# Patient Record
Sex: Male | Born: 1955
Health system: Southern US, Community
[De-identification: ages and names within clinical notes are randomized; demographics above are authoritative.]

## PROBLEM LIST (undated history)

## (undated) DIAGNOSIS — R7303 Prediabetes: Secondary | ICD-10-CM

## (undated) DIAGNOSIS — C61 Malignant neoplasm of prostate: Secondary | ICD-10-CM

## (undated) DIAGNOSIS — T7840XA Allergy, unspecified, initial encounter: Secondary | ICD-10-CM

## (undated) DIAGNOSIS — I872 Venous insufficiency (chronic) (peripheral): Secondary | ICD-10-CM

## (undated) DIAGNOSIS — I10 Essential (primary) hypertension: Secondary | ICD-10-CM

## (undated) DIAGNOSIS — E785 Hyperlipidemia, unspecified: Secondary | ICD-10-CM

## (undated) DIAGNOSIS — E119 Type 2 diabetes mellitus without complications: Secondary | ICD-10-CM

## (undated) HISTORY — DX: Essential (primary) hypertension: I10

## (undated) HISTORY — DX: Allergy, unspecified, initial encounter: T78.40XA

## (undated) HISTORY — PX: CIRCUMCISION: SUR203

## (undated) HISTORY — PX: COLONOSCOPY: SHX174

---

## 2006-02-16 ENCOUNTER — Ambulatory Visit: Payer: Self-pay | Admitting: Gastroenterology

## 2006-03-24 ENCOUNTER — Encounter (INDEPENDENT_AMBULATORY_CARE_PROVIDER_SITE_OTHER): Payer: Self-pay | Admitting: Specialist

## 2006-03-24 ENCOUNTER — Ambulatory Visit: Payer: Self-pay | Admitting: Gastroenterology

## 2010-06-13 ENCOUNTER — Encounter: Admission: RE | Admit: 2010-06-13 | Discharge: 2010-06-13 | Payer: Self-pay | Admitting: Family Medicine

## 2010-09-09 ENCOUNTER — Ambulatory Visit: Payer: Self-pay | Admitting: Gastroenterology

## 2011-01-21 NOTE — Procedures (Signed)
Summary: Colon   Colonoscopy  Procedure date:  03/24/2006  Findings:      Location:  [facility].    Colonoscopy  Procedure date:  03/24/2006  Findings:      Location:  [facility].   Patient Name: Andrew [name], Andrew [name] MRN:  Procedure Procedures: Colonoscopy CPT: [phone].    with polypectomy. CPT: [id number].  Personnel: Endoscopist: Rachael Fee, MD.  Exam Location: Exam performed in Outpatient Clinic. Outpatient  Patient Consent: Procedure, Alternatives, Risks and Benefits discussed, consent obtained, from patient. Consent was obtained by the RN.  Indications Symptoms: Hematochezia.  History  Current Medications: Patient is not currently taking Coumadin.  Pre-Exam Physical: Performed Mar 24, 2006. Cardio-pulmonary exam, Abdominal exam, Mental status exam WNL.  Comments: Pt. history reviewed/updated, physical exam performed prior to initiation of sedation? yes Exam Exam: Extent of exam reached: Cecum, extent intended: Cecum.  The cecum was identified by appendiceal orifice and IC valve. Patient position: on left side. Colon retroflexion performed. Images taken. ASA Classification: II. Tolerance: good.  Monitoring: Pulse and BP monitoring, Oximetry used. Supplemental O2 given.  Colon Prep Prep results: good.  Sedation Meds: Patient assessed and found to be appropriate for moderate (conscious) sedation. Fentanyl 100 mcg. given IV. Versed 9 mg. given IV.  Findings POLYP: Sigmoid Colon, Maximum size: 3 mm. sessile polyp. Procedure:  snare without cautery, removed, retrieved, Polyp sent to pathology. Polyp sent to pathology. ICD9: Colon Polyps: 211.3.  POLYP: Sigmoid Colon, Maximum size: 4 mm. sessile polyp. Procedure:  snare without cautery, removed, retrieved, sent to pathology. sent to pathology. ICD9: Colon Polyps: 211.3.  - NORMAL EXAM: Cecum to Rectum. Comments: Otherwise normal examination.  - HEMORRHOIDS: External. Size: Small.  ICD9: Hemorrhoids, External: 455.3. Comments: This is much smaller than when he was seen in clinic one month ago.   Assessment Abnormal examination, see findings above.  Diagnoses: 211.3: Colon Polyps.  455.3: Hemorrhoids, External.   Comments: 2 small colon polyps, no cancers. Comments: The large external hemorrhoid has decreased in size with topical treatments and fiber supplemenation, but was the likely source of his previous rectal bleeding.  Events  Unplanned Interventions: No intervention was required.  Unplanned Events: There were no complications. Plans Comments: He will be referred to general surgery to discuss hemorrhoidectomy options, timing.  If the polyps are tubular adenomas he will need repeat colonoscopy in 5 years. Otherwise he should continue to follow colorectal cancer screening guidelines for "routine risk" patients with colonoscopy in 10 years. Disposition: After procedure patient sent to recovery. After recovery patient sent home.  Scheduling/Referral: Await pathology to schedule patient.  Comments: General surgery referral.  Please send my recent office note and this report. This report was created from the original endoscopy report, which was reviewed and signed by the above listed endoscopist.

## 2011-01-21 NOTE — Assessment & Plan Note (Signed)
   History of Present Illness Visit Type: Initial Consult Primary GI MD: Rob Bunting MD Primary Artyom Stencel: Lorelee New Requesting Artavius Stearns: Lorelee New Chief Complaint: Screening for Colonoscopy, No GI Complaints, Mother was dx'd with rectal cancer History of Present Illness:     very pleasant 55 year old man who's mother was just diagnosed with rectal cancer, she is older and probably not going to undergo any treatment.   she is in her 61s.  he underwent April 2007 colonoscopy. 2 small hyperplastic polyps were removed, he was recommended to have a repeat colonoscopy at 10 year interval.   Since then no bowel changes at all. He has lost some weight, intensionally.  His PCP sent him  back to consider sooner colonoscopy.           Current Medications (verified): 1)  Aspirin 81 Mg Tbec (Aspirin) .Marland Kitchen.. 1 By Mouth Once Daily 2)  Centrum Silver  Tabs (Multiple Vitamins-Minerals) .Marland Kitchen.. 1 By Mouth Once Daily 3)  Maxzide-25 37.5-25 Mg Tabs (Triamterene-Hctz) .Marland Kitchen.. 1 By Mouth Once Daily  Allergies (verified): No Known Drug Allergies  Past History:  Past Medical History: Hypertension Hemorrhoids  hyperplastic colon polyps, 2007 Elevated risk for colon cancer (mother had  colon cancer)  Past Surgical History: none  Family History: mother diagnosed with rectal cancer in her 44s  Social History: he is married, he does not have children, he works as a Occupational psychologist, he does not smoke cigarettes or drink alcohol  Review of Systems       Pertinent positive and negative review of systems were noted in the above HPI and GI specific review of systems.  All other review of systems was otherwise negative.   Vital Signs:  Patient profile:   55 year old male Height:      72 inches Weight:      250 pounds BMI:     34.03 BSA:     2.34 Pulse rate:   78 / minute Pulse rhythm:   regular BP sitting:   138 / 84  (left arm)  Vitals Entered By: Merri Ray CMA Duncan Dull)  (September 09, 2010 10:35 AM)  Physical Exam  Additional Exam:  Constitutional: generally well appearing Psychiatric: alert and oriented times 3 Eyes: extraocular movements intact Mouth: oropharynx moist, no lesions Neck: supple, no lymphadenopathy Cardiovascular: heart regular rate and rythm Lungs: CTA bilaterally Abdomen: soft, non-tender, non-distended, no obvious ascites, no peritoneal signs, normal bowel sounds Extremities: no lower extremity edema bilaterally Skin: no lesions on visible extremities    Impression & Recommendations:  Problem # 1:  Elevated risk for colon cancer, family history we will change his recall interval to 5 years instead of 10 years. That gives him another 6 months until he is due for colonoscopy in April, 2012.  Patient Instructions: 1)  Recall colonoscopy (change from previous rec's) for colonoscopy in April 2012. 2)  Call Dr. Christella Hartigan' office sooner if any new problems arise. 3)  A copy of this information will be sent to Dr. Bruna Potter. 4)  The medication list was reviewed and reconciled.  All changed / newly prescribed medications were explained.  A complete medication list was provided to the patient / caregiver.  Appended Document: recall colon    Clinical Lists Changes  Observations: Added new observation of COLONNXTDUE: 03/2011 (09/09/2010 11:08)

## 2011-06-12 ENCOUNTER — Encounter (INDEPENDENT_AMBULATORY_CARE_PROVIDER_SITE_OTHER): Payer: Self-pay | Admitting: General Surgery

## 2011-06-12 ENCOUNTER — Other Ambulatory Visit (INDEPENDENT_AMBULATORY_CARE_PROVIDER_SITE_OTHER): Payer: Self-pay | Admitting: General Surgery

## 2016-01-03 ENCOUNTER — Encounter (HOSPITAL_COMMUNITY): Payer: Self-pay

## 2016-01-03 ENCOUNTER — Emergency Department (INDEPENDENT_AMBULATORY_CARE_PROVIDER_SITE_OTHER)
Admission: EM | Admit: 2016-01-03 | Discharge: 2016-01-03 | Disposition: A | Discharge status: Home / Self Care | Payer: 59 | Source: Home / Self Care | Attending: Family Medicine | Admitting: Family Medicine

## 2016-01-03 DIAGNOSIS — Z760 Encounter for issue of repeat prescription: Secondary | ICD-10-CM

## 2016-01-03 HISTORY — DX: Hyperlipidemia, unspecified: E78.5

## 2016-01-03 MED ORDER — AMLODIPINE BESYLATE 5 MG PO TABS: 5.0000 mg | ORAL_TABLET | Freq: Every day | ORAL | Status: DC

## 2016-01-03 MED ORDER — FUROSEMIDE 20 MG PO TABS: 20.0000 mg | ORAL_TABLET | Freq: Every day | ORAL | Status: AC

## 2016-01-03 MED ORDER — PRAVASTATIN SODIUM 10 MG PO TABS: 10.0000 mg | ORAL_TABLET | Freq: Every day | ORAL | Status: AC

## 2016-01-03 NOTE — ED Provider Notes (Signed)
CSN: [id number]     Arrival date & time 01/03/16  1548 History   First MD Initiated Contact with Patient 01/03/16 1723     Chief Complaint  Patient presents with  . 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 Pt had letter stating if medications need to be refilled, come to [facility]. Past Medical History  Diagnosis Date  . Hypertension   . Hyperlipemia    Past Surgical History  Procedure Laterality Date  . Colonoscopy    . Circumcision     No family history on file. Social History  Substance Use Topics  . Smoking status: Never Smoker   . Smokeless tobacco: Not on file  . Alcohol Use: Yes    Review of Systems ROS +'ve medication refill  Denies: HEADACHE, NAUSEA, ABDOMINAL PAIN, CHEST PAIN, CONGESTION, DYSURIA, SHORTNESS OF BREATH  Allergies  Review of patient's allergies indicates no known allergies.  Home Medications   Prior to Admission medications   Medication Sig Start Date End Date Taking? Authorizing Provider  amLODipine (NORVASC) 5 MG tablet Take 1 tablet (5 mg total) by mouth daily. 01/03/16   Konrad Felix, PA  aspirin 81 MG tablet Take 81 mg by mouth daily.      Historical Provider, MD  furosemide (LASIX) 20 MG tablet Take 1 tablet (20 mg total) by mouth daily. 01/03/16   Konrad Felix, PA  pravastatin (PRAVACHOL) 10 MG tablet Take 1 tablet (10 mg total) by mouth daily. 01/03/16   Konrad Felix, PA  triamterene-hydrochlorothiazide (MAXZIDE-25) 37.5-25 MG per tablet Take 1 tablet by mouth daily.      Historical Provider, MD   Meds Ordered and Administered this Visit  Medications - No data to display  BP 169/77 mmHg  Pulse 73  Temp(Src) 98.2 F (36.8 C) (Oral)  Resp 16  SpO2 99% No data found.   Physical Exam NURSES NOTES AND VITAL SIGNS REVIEWED. CONSTITUTIONAL: Well developed, well nourished, no acute distress HEENT:  normocephalic, atraumatic EYES: Conjunctiva normal NECK:normal ROM, supple PULMONARY:No respiratory distress, normal effort, Lungs: CTAb/l CARDIOVASCULAR: RRR, no murmur ABDOMEN: soft, ND, NT, +'ve BS MUSCULOSKELETAL: Normal ROM of all extremities SKIN: warm and dry without rash PSYCHIATRIC: Mood and affect normal  ED Course  Procedures (including critical care time)  Labs Review Labs Reviewed - No data to display  Imaging Review No results found.   Visual Acuity Review  Right Eye Distance:   Left Eye Distance:   Bilateral Distance:    Right Eye Near:   Left Eye Near:    Bilateral Near:         MDM   1. Encounter for medication refill    Patient is advised to continue home symptomatic treatment. Prescription for amlodipine,furosemide, pravastatin sent pharmacy patient has indicated. Patient is advised that if there are new or worsening symptoms or attend the emergency department, or contact primary care provider. Instructions of care provided discharged home in stable condition.  THIS NOTE WAS GENERATED USING A VOICE RECOGNITION SOFTWARE PROGRAM. ALL REASONABLE EFFORTS  WERE MADE TO PROOFREAD THIS DOCUMENT FOR ACCURACY.     Konrad Felix, [place] 01/03/16 1925

## 2016-01-03 NOTE — Discharge Instructions (Signed)
Medicine Refill at the Emergency Department  We have refilled your medicine today, but it is best for you to get refills through your primary health care provider's office. In the future, please plan ahead so you do not need to get refills from the emergency department.  If the medicine we refilled was a maintenance medicine, you may have received only enough to get you by until you are able to see your regular health care provider.     This information is not intended to replace advice given to you by your health care provider. Make sure you discuss any questions you have with your health care provider.     Document Released: 03/26/2004 Document Revised: 12/29/2014 Document Reviewed: 03/17/2014  Elsevier Interactive Patient Education 2016 [employer]

## 2016-01-03 NOTE — ED Notes (Signed)
Pt stated that his PCP passed on 04-01-2023 and that he needs refills for amlodipine,furosemide and pravastatin  Pt alert and oriented

## 2017-02-16 DIAGNOSIS — M791 Myalgia: Secondary | ICD-10-CM | POA: Diagnosis not present

## 2017-02-16 DIAGNOSIS — Z79899 Other long term (current) drug therapy: Secondary | ICD-10-CM | POA: Diagnosis not present

## 2017-02-16 DIAGNOSIS — M2550 Pain in unspecified joint: Secondary | ICD-10-CM | POA: Diagnosis not present

## 2017-02-16 DIAGNOSIS — R7302 Impaired glucose tolerance (oral): Secondary | ICD-10-CM | POA: Diagnosis not present

## 2017-02-16 DIAGNOSIS — I119 Hypertensive heart disease without heart failure: Secondary | ICD-10-CM | POA: Diagnosis not present

## 2017-02-16 DIAGNOSIS — E784 Other hyperlipidemia: Secondary | ICD-10-CM | POA: Diagnosis not present

## 2017-02-16 DIAGNOSIS — M609 Myositis, unspecified: Secondary | ICD-10-CM | POA: Diagnosis not present

## 2017-06-15 DIAGNOSIS — M2550 Pain in unspecified joint: Secondary | ICD-10-CM | POA: Diagnosis not present

## 2017-06-15 DIAGNOSIS — E784 Other hyperlipidemia: Secondary | ICD-10-CM | POA: Diagnosis not present

## 2017-06-15 DIAGNOSIS — R7302 Impaired glucose tolerance (oral): Secondary | ICD-10-CM | POA: Diagnosis not present

## 2017-06-15 DIAGNOSIS — E7800 Pure hypercholesterolemia, unspecified: Secondary | ICD-10-CM | POA: Diagnosis not present

## 2017-06-15 DIAGNOSIS — I119 Hypertensive heart disease without heart failure: Secondary | ICD-10-CM | POA: Diagnosis not present

## 2017-06-15 DIAGNOSIS — M609 Myositis, unspecified: Secondary | ICD-10-CM | POA: Diagnosis not present

## 2017-06-15 DIAGNOSIS — Z125 Encounter for screening for malignant neoplasm of prostate: Secondary | ICD-10-CM | POA: Diagnosis not present

## 2017-09-21 DIAGNOSIS — I119 Hypertensive heart disease without heart failure: Secondary | ICD-10-CM | POA: Diagnosis not present

## 2017-09-21 DIAGNOSIS — M2550 Pain in unspecified joint: Secondary | ICD-10-CM | POA: Diagnosis not present

## 2017-09-21 DIAGNOSIS — M609 Myositis, unspecified: Secondary | ICD-10-CM | POA: Diagnosis not present

## 2017-09-23 ENCOUNTER — Encounter: Payer: Self-pay | Admitting: Podiatry

## 2017-09-23 ENCOUNTER — Ambulatory Visit (INDEPENDENT_AMBULATORY_CARE_PROVIDER_SITE_OTHER): Payer: 59

## 2017-09-23 ENCOUNTER — Ambulatory Visit (INDEPENDENT_AMBULATORY_CARE_PROVIDER_SITE_OTHER): Payer: 59 | Admitting: Podiatry

## 2017-09-23 VITALS — BP 165/94 | HR 71 | Resp 16

## 2017-09-23 DIAGNOSIS — M722 Plantar fascial fibromatosis: Secondary | ICD-10-CM

## 2017-09-23 MED ORDER — DICLOFENAC SODIUM 75 MG PO TBEC: 75.0000 mg | DELAYED_RELEASE_TABLET | Freq: Two times a day (BID) | ORAL | 2 refills | Status: DC

## 2017-09-23 MED ORDER — TRIAMCINOLONE ACETONIDE 10 MG/ML IJ SUSP
10.0000 mg | Freq: Once | INTRAMUSCULAR | Status: AC
  Administered 2017-09-23: 10 mg

## 2017-09-23 NOTE — Progress Notes (Signed)
Subjective:    Patient ID: Andrew [name], male   DOB: 61 y.o.   MRN: [id number]   HPIpatient presents stating he has a lot of pain in the bottom of the right heel and states he works 2 jobs on [employer] and is on his foot for 16 Hour days. Patient does not smoke and does like to be active    Review of Systems  All other systems reviewed and are negative.       Objective:  Physical Exam  Constitutional: He appears well-developed and well-nourished.  Cardiovascular: Intact distal pulses.   Musculoskeletal: Normal range of motion.  Neurological: He is alert.  Skin: Skin is warm.  Nursing note and vitals reviewed.  neurovascular status found to be intact muscle strength adequate range of motion within normal limits with exquisite discomfort plantar aspect of the heel region right at the insertional point tendon calcaneus and slightly more proximal with no indication of Achilles tendon injury at this time. Patient's found have good digital perfusion well oriented 3     Assessment:    Acute plantar fasciitis right with inflammation with moderate flatfoot deformity     Plan:   H&P x-rays reviewed and today I went ahead and injected the right plantar fascia 3 mg Kenalog 5 mg Xylocaine and applied fascial brace with instructions placed on diclofenac 75 mg twice a day and gave him instructions for physical therapy. Reappoint her recheck in the next  X-ray indicates small spur with no indication of stress fracture arthritis with moderate depression of the arch

## 2017-09-23 NOTE — Patient Instructions (Signed)

## 2017-09-30 ENCOUNTER — Encounter: Payer: Self-pay | Admitting: Podiatry

## 2017-09-30 ENCOUNTER — Ambulatory Visit (INDEPENDENT_AMBULATORY_CARE_PROVIDER_SITE_OTHER): Payer: 59 | Admitting: Podiatry

## 2017-09-30 DIAGNOSIS — M722 Plantar fascial fibromatosis: Secondary | ICD-10-CM | POA: Diagnosis not present

## 2017-09-30 NOTE — Progress Notes (Signed)
Subjective:    Patient ID: Andrew [name], male   DOB: 61 y.o.   MRN: [id number]   HPI patient states that the heels feeling quite a bit better but he is on his foot 16 hours a day and is flat-footed    ROS      Objective:  Physical Exam neurovascular status intact negative Homan sign was noted with patient found to have significant flattening the arch bilateral with inflammation around the mid arch area that's mild in intensity     Assessment:  Significant structural malalignment of the foot with moderate fasciitis     Plan:    Advised on physical therapy anti-inflammatories and continued lifting of the arch and also discussed long-term orthotics and he will be approved and have them made by Liliane Channel with probable lifting of the arch bilateral and a more rigid type orthotic with cushioned top cover

## 2017-10-07 ENCOUNTER — Telehealth: Payer: Self-pay | Admitting: Podiatry

## 2017-10-07 NOTE — Telephone Encounter (Signed)
lvm for pt to call back to discuss orthotic coverage. Insurance will only cover if diabetic.

## 2017-12-18 ENCOUNTER — Telehealth: Payer: Self-pay | Admitting: *Deleted

## 2017-12-18 MED ORDER — DICLOFENAC SODIUM 75 MG PO TBEC: 75.0000 mg | DELAYED_RELEASE_TABLET | Freq: Two times a day (BID) | ORAL | 0 refills | Status: DC

## 2017-12-18 NOTE — Telephone Encounter (Signed)
Refill request for diclofenac. Dr. Paulla Dolly ordered refill once, pt needs an appt.

## 2018-01-13 ENCOUNTER — Telehealth: Payer: Self-pay | Admitting: *Deleted

## 2018-01-13 MED ORDER — DICLOFENAC SODIUM 75 MG PO TBEC: 75.0000 mg | DELAYED_RELEASE_TABLET | Freq: Two times a day (BID) | ORAL | 0 refills | Status: DC

## 2018-01-13 NOTE — Telephone Encounter (Signed)
Refill request diclofenac. Dr. Paulla Dolly states refill once then pt needs and appt prior to future refills.

## 2018-01-18 DIAGNOSIS — M2550 Pain in unspecified joint: Secondary | ICD-10-CM | POA: Diagnosis not present

## 2018-01-18 DIAGNOSIS — I119 Hypertensive heart disease without heart failure: Secondary | ICD-10-CM | POA: Diagnosis not present

## 2018-01-18 DIAGNOSIS — M609 Myositis, unspecified: Secondary | ICD-10-CM | POA: Diagnosis not present

## 2018-04-16 ENCOUNTER — Telehealth: Payer: Self-pay | Admitting: *Deleted

## 2018-04-16 NOTE — Telephone Encounter (Signed)
Refill request for diclofenac. Dr. Paulla Dolly states pt needs to be seen, prior to future refills. Return fax denying.

## 2018-05-10 DIAGNOSIS — R2243 Localized swelling, mass and lump, lower limb, bilateral: Secondary | ICD-10-CM | POA: Diagnosis not present

## 2018-05-10 DIAGNOSIS — E7800 Pure hypercholesterolemia, unspecified: Secondary | ICD-10-CM | POA: Diagnosis not present

## 2018-05-10 DIAGNOSIS — I119 Hypertensive heart disease without heart failure: Secondary | ICD-10-CM | POA: Diagnosis not present

## 2018-05-10 DIAGNOSIS — R7302 Impaired glucose tolerance (oral): Secondary | ICD-10-CM | POA: Diagnosis not present

## 2018-05-10 DIAGNOSIS — E785 Hyperlipidemia, unspecified: Secondary | ICD-10-CM | POA: Diagnosis not present

## 2018-05-10 DIAGNOSIS — M609 Myositis, unspecified: Secondary | ICD-10-CM | POA: Diagnosis not present

## 2018-05-10 DIAGNOSIS — M2550 Pain in unspecified joint: Secondary | ICD-10-CM | POA: Diagnosis not present

## 2018-05-10 DIAGNOSIS — Z125 Encounter for screening for malignant neoplasm of prostate: Secondary | ICD-10-CM | POA: Diagnosis not present

## 2018-07-05 ENCOUNTER — Other Ambulatory Visit (HOSPITAL_COMMUNITY): Payer: Self-pay | Admitting: Pulmonary Disease

## 2018-07-05 DIAGNOSIS — M609 Myositis, unspecified: Secondary | ICD-10-CM | POA: Diagnosis not present

## 2018-07-05 DIAGNOSIS — L97911 Non-pressure chronic ulcer of unspecified part of right lower leg limited to breakdown of skin: Secondary | ICD-10-CM | POA: Diagnosis not present

## 2018-07-05 DIAGNOSIS — M7989 Other specified soft tissue disorders: Secondary | ICD-10-CM | POA: Diagnosis not present

## 2018-07-05 DIAGNOSIS — R609 Edema, unspecified: Secondary | ICD-10-CM

## 2018-07-06 ENCOUNTER — Ambulatory Visit (HOSPITAL_COMMUNITY)
Admission: RE | Admit: 2018-07-06 | Discharge: 2018-07-06 | Disposition: A | Discharge status: Home / Self Care | Payer: 59 | Source: Ambulatory Visit | Attending: Pulmonary Disease | Admitting: Pulmonary Disease

## 2018-07-06 DIAGNOSIS — R609 Edema, unspecified: Secondary | ICD-10-CM | POA: Insufficient documentation

## 2018-07-06 NOTE — Progress Notes (Addendum)
Bilateral lower extremity venous duplex has been completed. Negative for obvious evidence of DVT. Ultrasound characteristics of an enlarged lymph node measuring 0.6 cm high by 2.5 cm wide by 2 cm long is noted in the right groin.  Results were given to Dr. Katherine Roan.  07/06/18 11:21 AM Andrew [name] RVT

## 2019-01-17 ENCOUNTER — Other Ambulatory Visit (HOSPITAL_COMMUNITY): Payer: Self-pay | Admitting: Pulmonary Disease

## 2019-01-17 ENCOUNTER — Other Ambulatory Visit: Payer: Self-pay | Admitting: Pulmonary Disease

## 2019-01-17 DIAGNOSIS — R7302 Impaired glucose tolerance (oral): Secondary | ICD-10-CM | POA: Diagnosis not present

## 2019-01-17 DIAGNOSIS — I119 Hypertensive heart disease without heart failure: Secondary | ICD-10-CM | POA: Diagnosis not present

## 2019-01-17 DIAGNOSIS — I890 Lymphedema, not elsewhere classified: Secondary | ICD-10-CM | POA: Diagnosis not present

## 2019-01-17 DIAGNOSIS — R590 Localized enlarged lymph nodes: Secondary | ICD-10-CM

## 2019-01-17 DIAGNOSIS — E7800 Pure hypercholesterolemia, unspecified: Secondary | ICD-10-CM | POA: Diagnosis not present

## 2019-01-17 DIAGNOSIS — S80821A Blister (nonthermal), right lower leg, initial encounter: Secondary | ICD-10-CM | POA: Diagnosis not present

## 2019-01-17 DIAGNOSIS — M791 Myalgia, unspecified site: Secondary | ICD-10-CM | POA: Diagnosis not present

## 2019-01-17 DIAGNOSIS — Z125 Encounter for screening for malignant neoplasm of prostate: Secondary | ICD-10-CM | POA: Diagnosis not present

## 2019-01-21 ENCOUNTER — Ambulatory Visit (HOSPITAL_COMMUNITY): Payer: 59

## 2019-01-25 ENCOUNTER — Ambulatory Visit (HOSPITAL_COMMUNITY): Payer: 59

## 2019-02-02 ENCOUNTER — Encounter (HOSPITAL_COMMUNITY): Payer: Self-pay

## 2019-02-02 ENCOUNTER — Ambulatory Visit (HOSPITAL_COMMUNITY): Payer: 59

## 2019-02-03 ENCOUNTER — Other Ambulatory Visit: Payer: Self-pay | Admitting: Pulmonary Disease

## 2019-02-03 ENCOUNTER — Other Ambulatory Visit (HOSPITAL_COMMUNITY): Payer: Self-pay | Admitting: Pulmonary Disease

## 2019-03-17 ENCOUNTER — Other Ambulatory Visit: Payer: Self-pay | Admitting: Pulmonary Disease

## 2019-03-17 DIAGNOSIS — R591 Generalized enlarged lymph nodes: Secondary | ICD-10-CM

## 2019-04-18 ENCOUNTER — Encounter (HOSPITAL_COMMUNITY): Payer: Self-pay

## 2019-04-18 ENCOUNTER — Ambulatory Visit (HOSPITAL_COMMUNITY): Admission: RE | Admit: 2019-04-18 | Payer: 59 | Source: Ambulatory Visit

## 2019-05-02 DIAGNOSIS — M7989 Other specified soft tissue disorders: Secondary | ICD-10-CM | POA: Diagnosis not present

## 2019-05-02 DIAGNOSIS — E1165 Type 2 diabetes mellitus with hyperglycemia: Secondary | ICD-10-CM | POA: Diagnosis not present

## 2019-05-02 DIAGNOSIS — I119 Hypertensive heart disease without heart failure: Secondary | ICD-10-CM | POA: Diagnosis not present

## 2019-05-02 DIAGNOSIS — L819 Disorder of pigmentation, unspecified: Secondary | ICD-10-CM | POA: Diagnosis not present

## 2019-05-02 DIAGNOSIS — R590 Localized enlarged lymph nodes: Secondary | ICD-10-CM | POA: Diagnosis not present

## 2019-05-02 DIAGNOSIS — Z79899 Other long term (current) drug therapy: Secondary | ICD-10-CM | POA: Diagnosis not present

## 2019-05-09 ENCOUNTER — Ambulatory Visit (HOSPITAL_COMMUNITY): Payer: 59

## 2019-05-10 ENCOUNTER — Other Ambulatory Visit: Payer: Self-pay

## 2019-05-10 ENCOUNTER — Ambulatory Visit (HOSPITAL_COMMUNITY)
Admission: RE | Admit: 2019-05-10 | Discharge: 2019-05-10 | Disposition: A | Discharge status: Home / Self Care | Payer: 59 | Source: Ambulatory Visit | Attending: Pulmonary Disease | Admitting: Pulmonary Disease

## 2019-05-10 DIAGNOSIS — R591 Generalized enlarged lymph nodes: Secondary | ICD-10-CM | POA: Insufficient documentation

## 2019-05-19 ENCOUNTER — Other Ambulatory Visit (HOSPITAL_COMMUNITY): Payer: Self-pay | Admitting: Pulmonary Disease

## 2019-05-19 DIAGNOSIS — R591 Generalized enlarged lymph nodes: Secondary | ICD-10-CM

## 2019-05-19 DIAGNOSIS — M7989 Other specified soft tissue disorders: Secondary | ICD-10-CM

## 2019-06-23 HISTORY — PX: PROSTATE BIOPSY: SHX241

## 2019-07-01 ENCOUNTER — Encounter: Payer: Self-pay | Admitting: *Deleted

## 2019-07-11 ENCOUNTER — Encounter: Payer: Self-pay | Admitting: Radiation Oncology

## 2019-07-11 NOTE — Progress Notes (Signed)
GU Location of Tumor / Histology: prostatic adenocarcinoma  If Prostate Cancer, Gleason Score is (4 + 3) and PSA is (5.3). Prostate volume: 59.6 cm^3  Newton Pigg presented to PCP for annual physical where blood work revealed elevated PSA. PCP referred patient to Dr. Lovena Neighbours for further evaluation. Dr. Lovena Neighbours noted his PSA was even more elevated.   Biopsies of prostate (if applicable) revealed:    Past/Anticipated interventions by urology, if any: prostate biopsy, referral for consideration of radiotherapy  Past/Anticipated interventions by medical oncology, if any: no  Weight changes, if any: denies  Bowel/Bladder complaints, if any: IPSS 10. LUTS less with tamsulosin.SHIM 25 with medication. Switched from Cialis to Sildenafil due to cost. Denies urinary leakage or incontinence. Denies dysuria or hematuria.   Nausea/Vomiting, if any: no  Pain issues, if any:  Joint pain in right wrist  SAFETY ISSUES:  Prior radiation? no  Pacemaker/ICD? no  Possible current pregnancy? no, male patient  Is the patient on methotrexate? no  Current Complaints / other details:  63 year old male. Married/divorced? NO family hx of prostate ca. Sister with hx of breast ca. Sister with hx of colon ca. Father with hx of lung ca. NKDA. [facility] sent for evaluation of bilateral lower extremities but ultrasound came up negative.

## 2019-07-12 ENCOUNTER — Other Ambulatory Visit: Payer: Self-pay

## 2019-07-12 ENCOUNTER — Ambulatory Visit: Payer: 59

## 2019-07-12 ENCOUNTER — Ambulatory Visit
Admission: RE | Admit: 2019-07-12 | Discharge: 2019-07-12 | Disposition: A | Discharge status: Home / Self Care | Payer: 59 | Source: Ambulatory Visit | Attending: Radiation Oncology | Admitting: Radiation Oncology

## 2019-07-12 ENCOUNTER — Encounter: Payer: Self-pay | Admitting: Radiation Oncology

## 2019-07-12 VITALS — Ht 71.0 in | Wt 280.0 lb

## 2019-07-12 DIAGNOSIS — C61 Malignant neoplasm of prostate: Secondary | ICD-10-CM

## 2019-07-12 HISTORY — DX: Malignant neoplasm of prostate: C61

## 2019-07-12 NOTE — Progress Notes (Signed)
Radiation Oncology         [phone] ________________________________  Initial Outpatient Consultation - Conducted via telephone due to current COVID-19 concerns for limiting patient exposure  Name: Andrew [name] MRN: [id number]  Date: 07/12/2019  DOB: [birth year]  Andrew [name], Andrew [name], [name]  Andrew [name]*   REFERRING PHYSICIAN: Davis [name]*  DIAGNOSIS: 63 y.o. gentleman with Stage T1c adenocarcinoma of the prostate with Gleason score of 4+3, and PSA of 5.30.    ICD-10-CM   1. Malignant neoplasm of prostate ([place])  C61     HISTORY OF PRESENT ILLNESS: Andrew [name] is a 63 y.o. male with a diagnosis of prostate cancer. He was noted to have an elevated PSA of 4.3 in January 2020 by his primary care physician, Dr. Vincente [name].  Accordingly, he was referred for evaluation in urology by Dr. Lovena [name] on 05/19/2019, and digital rectal examination was performed revealing no nodules or asymmetry.  Repeat PSA performed at that time was further elevated to 5.30. The patient proceeded to transrectal ultrasound with 12 biopsies of the prostate on 06/23/2019.  The prostate volume measured 59.6 cc.  Out of 12 core biopsies, 3 were positive.  The maximum Gleason score was 4+3, and this was seen in the right base. Additionally, Gleason 3+4 disease was seen in the left base lateral and right mid.  Biopsies of prostate revealed:    A CT A/P was performed 07/11/19 for disease staging and was without lymphadenopathy in the abdomen or pelvis and no evidence of definite metastatic disease but did show bilateral adrenal nodules that are felt most likely benign.  The recommendation is to consider adrenal protocol CT for further evaluation of the adrenal nodules.   Of note, the patient also reports some chronic right lower extremity swelling, discoloration of the skin and discomfort that has been previously evaluated with his PCP with [facility] and work up to date has been inconclusive. This  issue remains quite frustrating and is beginning to interfere with his QOL and ability to remain as active as he would like.  The patient reviewed the biopsy results with his urologist and he has kindly been referred today for discussion of potential radiation treatment options.   PREVIOUS RADIATION THERAPY: No  PAST MEDICAL HISTORY:  Past Medical History:  Diagnosis Date   Hyperlipemia    Hypertension    Prostate cancer ([place])       PAST SURGICAL HISTORY: Past Surgical History:  Procedure Laterality Date   CIRCUMCISION     COLONOSCOPY      FAMILY HISTORY:  Family History  Problem Relation Age of Onset   Vaginal cancer Mother    Bladder Cancer Mother    Rectal cancer Mother    Lung cancer Father    Breast cancer Sister    Colon cancer Sister    Prostate cancer Neg Hx     SOCIAL HISTORY:  Social History   Socioeconomic History   Marital status: Divorced    Spouse name: Not on file   Number of children: 0   Years of education: Not on file   Highest education level: Not on file  Occupational History    Comment: full time   Social Designer, fashion/clothing strain: Not on file   Food insecurity    Worry: Not on file    Inability: Not on file   Transportation needs    Medical: Not on file    Non-medical: Not on file  Tobacco Use  Smoking status: Never Smoker   Smokeless tobacco: Never Used  Substance and Sexual Activity   Alcohol use: Yes   Drug use: No   Sexual activity: Yes    Comment: with aid of sildenafil  Lifestyle   Physical activity    Days per week: Not on file    Minutes per session: Not on file   Stress: Not on file  Relationships   Social connections    Talks on phone: Not on file    Gets together: Not on file    Attends religious service: Not on file    Active member of club or organization: Not on file    Attends meetings of clubs or organizations: Not on file    Relationship status: Not on file   Intimate  partner violence    Fear of current or ex partner: Not on file    Emotionally abused: Not on file    Physically abused: Not on file    Forced sexual activity: Not on file  Other Topics Concern   Not on file  Social History Narrative   Not on file    ALLERGIES: Patient has no known allergies.  MEDICATIONS:  Current Outpatient Medications  Medication Sig Dispense Refill   amLODipine (NORVASC) 5 MG tablet Take 1 tablet (5 mg total) by mouth daily. 90 tablet 1   aspirin 81 MG tablet Take 81 mg by mouth daily.       furosemide (LASIX) 20 MG tablet Take 1 tablet (20 mg total) by mouth daily. 90 tablet 1   JANUVIA 50 MG tablet Take 50 mg by mouth daily.     pravastatin (PRAVACHOL) 10 MG tablet Take 1 tablet (10 mg total) by mouth daily. 90 tablet 1   sildenafil (VIAGRA) 25 MG tablet Take 25 mg by mouth daily as needed for erectile dysfunction.     triamterene-hydrochlorothiazide (MAXZIDE-25) 37.5-25 MG per tablet Take 1 tablet by mouth daily.       tamsulosin (FLOMAX) 0.4 MG CAPS capsule Take 0.4 mg by mouth daily.     No current facility-administered medications for this encounter.     REVIEW OF SYSTEMS:  On review of systems, the patient reports that he is doing well overall. He denies any chest pain, shortness of breath, cough, fevers, chills, night sweats, or unintended weight changes. He denies any bowel disturbances, and denies abdominal pain, nausea or vomiting. He reports joint pain in his right wrist. He reports chronic lower extremity swelling in his right lower leg that started a year ago. He states that the skin to his right lower leg is discolored and feels leathery. His IPSS was 10, indicating moderate urinary symptoms; LUTS lessened with tamsulosin. He denies dysuria, hematuria, leakage or incontinence. His SHIM was 25, indicating he does not have erectile dysfunction with aid of sildenafil. A complete review of systems is obtained and is otherwise negative.      PHYSICAL EXAM: Not performed in light of telephone consultation. Wt Readings from Last 3 Encounters:  07/12/19 280 lb (127 kg)  06/12/11 255 lb (115.7 kg)   Temp Readings from Last 3 Encounters:  01/03/16 98.2 F (36.8 C) (Oral)  06/12/11 97.2 F (36.2 C) (Temporal)   BP Readings from Last 3 Encounters:  09/23/17 (!) 165/94  01/03/16 169/77  06/12/11 128/82   Pulse Readings from Last 3 Encounters:  09/23/17 71  01/03/16 73  06/12/11 64   Pain Assessment Pain Score: 0-No pain/10   LABORATORY DATA:  No  results found for: WBC, HGB, HCT, MCV, PLT No results found for: NA, K, CL, CO2 No results found for: ALT, AST, GGT, ALKPHOS, BILITOT   RADIOGRAPHY: No results found.    IMPRESSION/PLAN: 1. 63 y.o. gentleman with Stage T1c adenocarcinoma of the prostate with Gleason Score of 4+3, and PSA of 5.30. We discussed the patient's workup and outlined the nature of prostate cancer in this setting. The patient's T stage, Gleason's score, and PSA put him into the unfavorable-intermediate risk group. Accordingly, he is eligible for a variety of potential treatment options including brachytherapy, 5.5 weeks of external radiation, or prostatectomy. We discussed the available radiation techniques, and focused on the details and logistics and delivery. We discussed and outlined the risks, benefits, short and long-term effects associated with radiotherapy and compared and contrasted these with prostatectomy. We discussed the role of SpaceOAR in reducing the rectal toxicity associated with radiotherapy.  At the conclusion of our conversation, the patient remains undecided about which treatment option he would like to pursue at this time. We will share our discussion with Dr. Lovena [name] and Dr. Gloriann Loan. The patient is scheduled to meet with Dr. Gloriann Loan to further discuss surgical options next Monday, July 27th. We will follow up with the patient on his final decision after that visit and will proceed with  treatment planning accordingly at that time should he elect to pursue radiation. He was encouraged to call with any additional questions or concerns in the interim.  2. Chronic right lower extremity swelling. Refer back to PCP, Dr. Katherine Roan for further evaluation/management and consideration for referral to vascular surgery for further work up.  Given current concerns for patient exposure during the COVID-19 pandemic, this encounter was conducted via telephone. The patient was notified in advance and was offered a [place] meeting to allow for face to face communication but unfortunately reported that he did not have the appropriate resources/technology to support such a visit and instead preferred to proceed with telephone consult. The patient has given verbal consent for this type of encounter. The time spent during this encounter was 80 minutes. The attendants for this meeting include Andrew [name], Andrew [name], scribe Andrew [name], patient Andrew [name], his brother Andrew [name] and two sisters, Andrew [name] and Andrew [name]. During the encounter, Andrew [name], Andrew [name], and scribe Andrew [name] were located at [facility] Radiation Oncology Department.  Patient Andrew BOATENG and family were located at home.    Andrew Johns, [name]    Andrew Pita, [name]  [facility] Direct Dial: [phone]   Fax: [phone] .com   Skype   LinkedIn  This document serves as a record of services personally performed by Andrew Pita, [name] and Andrew Caldron, [name]. It was created on their behalf by Andrew [name], a trained medical scribe. The creation of this record is based on the scribe's personal observations and the providers' statements to them. This document has been checked and approved by the attending providers.

## 2019-07-12 NOTE — Progress Notes (Signed)
See progress note under physician encounter. 

## 2019-08-04 ENCOUNTER — Telehealth: Payer: Self-pay | Admitting: *Deleted

## 2019-08-04 ENCOUNTER — Telehealth: Payer: Self-pay | Admitting: Medical Oncology

## 2019-08-04 NOTE — Telephone Encounter (Signed)
xxxxx 

## 2019-08-04 NOTE — Telephone Encounter (Signed)
Called Andrew [name] to follow up post consult with Dr. Tammi Klippel 07/12/19. I introduced myself as the navigator and asked him to return my call to further discuss my role and his treatment decision. He had follow up visit this afternoon with Dr. Gloriann Loan and per note, patient would like to move forward with brachytherapy. I informed him, I will forward his decision to Dr. Forrest Moron.

## 2019-08-04 NOTE — Telephone Encounter (Signed)
CALLED PATIENT TO ASK QUESTIONS, LVM FOR A RETURN CALL 

## 2019-08-05 ENCOUNTER — Telehealth: Payer: Self-pay | Admitting: *Deleted

## 2019-08-05 NOTE — Telephone Encounter (Signed)
Called patient to ask question, lvm for a return call 

## 2019-08-08 ENCOUNTER — Telehealth: Payer: Self-pay | Admitting: Medical Oncology

## 2019-08-08 ENCOUNTER — Telehealth: Payer: Self-pay | Admitting: *Deleted

## 2019-08-08 ENCOUNTER — Other Ambulatory Visit: Payer: Self-pay | Admitting: Urology

## 2019-08-08 NOTE — Telephone Encounter (Signed)
Called [facility] verify they received prescription for finasteride. It was received but insurance needs prior approval. I spoke with patient to inform him of the above. I asked him to call me when he gets the medication. He voiced understanding.

## 2019-08-08 NOTE — Telephone Encounter (Signed)
CALLED PATIENT TO ASK QUESTION, LVM FOR A RETURN CALL 

## 2019-08-08 NOTE — Telephone Encounter (Signed)
Spoke with Mickel Baas at [facility] regarding prescription for finasteride. I informed her that [facility] has not received it. She can see where Dr. Lovena Neighbours prescribed it, but prescription did not go thru to [facility]. She will resend it. I will update patient.

## 2019-08-08 NOTE — Telephone Encounter (Signed)
Spoke with patient, to see if he has been contacted by Dr. Jackson Latino office regarding a prescription for finasteride. He has not received a call. I explained, that his prostate size is borderline for seed implant and finasteride will help to reduce the size of his prostate. I will follow up on the prescription and call him back with an update. He voiced understanding.

## 2019-08-09 ENCOUNTER — Telehealth: Payer: Self-pay | Admitting: *Deleted

## 2019-08-09 NOTE — Telephone Encounter (Signed)
CALLED PATIENT TO INFORM OF PRE-SEED APPTS. FOR 08-18-19 AND HIS IMPLANT ON 09-12-19, SPOKE WITH PATIENT AND HE IS AWARE OF THESE APPTS.

## 2019-08-12 ENCOUNTER — Telehealth: Payer: Self-pay | Admitting: Medical Oncology

## 2019-08-12 NOTE — Telephone Encounter (Signed)
[facility] called stating she obtained prior authorization for finasteride. I spoke with [facility] and they did receive.

## 2019-08-12 NOTE — Telephone Encounter (Signed)
Spoke with [facility] regarding prior approval on finasteride. Prescription was called in 8/17 and [facility] still does not have approval. Mickel Baas will follow up and return my call.

## 2019-08-12 NOTE — Telephone Encounter (Signed)
Spoke with patient to inform him finasteride has been approved. I spoke with [facility]ist at [facility] to verify. Patient voiced understanding and thanked me for helping him get the medication. I asked him to call me with questions or concerns.

## 2019-08-12 NOTE — Telephone Encounter (Signed)
Patient called stating the finasteride has not been approved by his insurance company. I will follow up with [facility] and call him back. He voiced understanding.

## 2019-08-17 ENCOUNTER — Telehealth: Payer: Self-pay | Admitting: *Deleted

## 2019-08-17 NOTE — Telephone Encounter (Signed)
CALLED PATIENT TO REMIND OF PRE-SEED APPTS. FOR 08-18-19, LVM FOR A RETURN CALL

## 2019-08-18 ENCOUNTER — Ambulatory Visit (HOSPITAL_COMMUNITY)
Admission: RE | Admit: 2019-08-18 | Discharge: 2019-08-18 | Disposition: A | Discharge status: Home / Self Care | Payer: 59 | Source: Ambulatory Visit | Attending: Urology | Admitting: Urology

## 2019-08-18 ENCOUNTER — Encounter (HOSPITAL_COMMUNITY)
Admission: RE | Admit: 2019-08-18 | Discharge: 2019-08-18 | Disposition: A | Discharge status: Home / Self Care | Payer: 59 | Source: Ambulatory Visit | Attending: Urology | Admitting: Urology

## 2019-08-18 ENCOUNTER — Ambulatory Visit
Admission: RE | Admit: 2019-08-18 | Discharge: 2019-08-18 | Disposition: A | Discharge status: Home / Self Care | Payer: 59 | Source: Ambulatory Visit | Attending: Urology | Admitting: Urology

## 2019-08-18 ENCOUNTER — Other Ambulatory Visit: Payer: Self-pay

## 2019-08-18 ENCOUNTER — Ambulatory Visit
Admission: RE | Admit: 2019-08-18 | Discharge: 2019-08-18 | Disposition: A | Discharge status: Home / Self Care | Payer: 59 | Source: Ambulatory Visit | Attending: Radiation Oncology | Admitting: Radiation Oncology

## 2019-08-18 DIAGNOSIS — C61 Malignant neoplasm of prostate: Secondary | ICD-10-CM | POA: Diagnosis present

## 2019-08-18 NOTE — Progress Notes (Signed)
  Radiation Oncology         [phone] ________________________________  Name: CORRELL MCNABB MRN: [id number]  Date: 08/18/2019  DOB: 1956-03-15  SIMULATION AND TREATMENT PLANNING NOTE PUBIC ARCH STUDY  MP:[id number], [name], MD  [name], MD  DIAGNOSIS: 63 y.o. gentleman with Stage T1c adenocarcinoma of the prostate with Gleason score of 4+3, and PSA of 5.30     ICD-10-CM   1. Malignant neoplasm of prostate ([place])  C61     COMPLEX SIMULATION:  The patient presented today for evaluation for possible prostate seed implant. He was brought to the radiation planning suite and placed supine on the CT couch. A 3-dimensional image study set was obtained in upload to the planning computer. There, on each axial slice, I contoured the prostate gland. Then, using three-dimensional radiation planning tools I reconstructed the prostate in view of the structures from the transperineal needle pathway to assess for possible pubic arch interference. In doing so, I did not appreciate any pubic arch interference. Also, the patient's prostate volume was estimated based on the drawn structure. The volume was 59 cc.  TRUS volume was 59.6 cc.  Given the pubic arch appearance and prostate volume, patient remains a good candidate to proceed with prostate seed implant. Today, he freely provided informed written consent to proceed.    PLAN: The patient will undergo prostate seed implant.   ________________________________  Sheral Apley. Tammi Klippel, M.D.

## 2019-09-02 ENCOUNTER — Other Ambulatory Visit: Payer: Self-pay | Admitting: Urology

## 2019-09-02 DIAGNOSIS — C61 Malignant neoplasm of prostate: Secondary | ICD-10-CM

## 2019-09-08 ENCOUNTER — Encounter (HOSPITAL_COMMUNITY)
Admission: RE | Admit: 2019-09-08 | Discharge: 2019-09-08 | Disposition: A | Discharge status: Home / Self Care | Payer: 59 | Source: Ambulatory Visit | Attending: Urology | Admitting: Urology

## 2019-09-08 ENCOUNTER — Other Ambulatory Visit (HOSPITAL_COMMUNITY)
Admission: RE | Admit: 2019-09-08 | Discharge: 2019-09-08 | Disposition: A | Discharge status: Home / Self Care | Payer: 59 | Source: Ambulatory Visit | Attending: Urology | Admitting: Urology

## 2019-09-08 ENCOUNTER — Other Ambulatory Visit: Payer: Self-pay

## 2019-09-08 ENCOUNTER — Encounter (HOSPITAL_BASED_OUTPATIENT_CLINIC_OR_DEPARTMENT_OTHER): Payer: Self-pay | Admitting: *Deleted

## 2019-09-08 DIAGNOSIS — Z01812 Encounter for preprocedural laboratory examination: Secondary | ICD-10-CM | POA: Diagnosis not present

## 2019-09-08 DIAGNOSIS — Z20828 Contact with and (suspected) exposure to other viral communicable diseases: Secondary | ICD-10-CM | POA: Insufficient documentation

## 2019-09-08 LAB — COMPREHENSIVE METABOLIC PANEL
ALT: 12 U/L (ref 0–44)
AST: 26 U/L (ref 15–41)
Albumin: 4 g/dL (ref 3.5–5.0)
Alkaline Phosphatase: 60 U/L (ref 38–126)
Anion gap: 8 (ref 5–15)
BUN: 26 mg/dL — ABNORMAL HIGH (ref 8–23)
CO2: 26 mmol/L (ref 22–32)
Calcium: 9.3 mg/dL (ref 8.9–10.3)
Chloride: 103 mmol/L (ref 98–111)
Creatinine, Ser: 1.45 mg/dL — ABNORMAL HIGH (ref 0.61–1.24)
GFR calc Af Amer: 59 mL/min — ABNORMAL LOW (ref >60)
GFR calc non Af Amer: 51 mL/min — ABNORMAL LOW (ref >60)
Glucose, Bld: 173 mg/dL — ABNORMAL HIGH (ref 70–99)
Potassium: 3.8 mmol/L (ref 3.5–5.1)
Sodium: 137 mmol/L (ref 135–145)
Total Bilirubin: 0.5 mg/dL (ref 0.3–1.2)
Total Protein: 6.9 g/dL (ref 6.5–8.1)

## 2019-09-08 LAB — CBC
HCT: 38.2 % — ABNORMAL LOW (ref 39.0–52.0)
Hemoglobin: 12.8 g/dL — ABNORMAL LOW (ref 13.0–17.0)
MCH: 30.7 pg (ref 26.0–34.0)
MCHC: 33.5 g/dL (ref 30.0–36.0)
MCV: 91.6 fL (ref 80.0–100.0)
Platelets: 230 10*3/uL (ref 150–400)
RBC: 4.17 MIL/uL — ABNORMAL LOW (ref 4.22–5.81)
RDW: 14 % (ref 11.5–15.5)
WBC: 6.1 10*3/uL (ref 4.0–10.5)
nRBC: 0 % (ref 0.0–0.2)

## 2019-09-08 LAB — PROTIME-INR
INR: 1 (ref 0.8–1.2)
Prothrombin Time: 13.4 seconds (ref 11.4–15.2)

## 2019-09-08 LAB — APTT: aPTT: 34 seconds (ref 24–36)

## 2019-09-08 NOTE — Progress Notes (Signed)
JESSICA ZANETTO PA REVIEWED EKG DONE 08-18-19 AND EKG IS OK FOR SURGERY PER JESSICA ZANETTO PA

## 2019-09-08 NOTE — Progress Notes (Signed)
SPOKE WITH [facility], NPO AFTER MIDNIGHT, ARRIVE 530 AM [facility] 09-12-19. MEDS TO TAKE SIP OF WATER: AMLODIPINE, FINASTERIDE, TAMSULOSIN, FLEETS ENEMA AM OF SURGERY. HAS SURGERY ORDERS IN Epic. RECORDS IN CHART/Epic: CBC, CMET, PT, PTT DONE 09-08-2019, EKG AND CHEST XRAY DONE 08-18-19. DRIVER BROTHER SID CELL [phone]. BROTHER SID TO STAY FOR SURGERY IN WAITING ROOM.

## 2019-09-09 ENCOUNTER — Telehealth: Payer: Self-pay | Admitting: *Deleted

## 2019-09-09 LAB — NOVEL CORONAVIRUS, NAA (HOSP ORDER, SEND-OUT TO REF LAB; TAT 18-24 HRS): SARS-CoV-2, NAA: NOT DETECTED

## 2019-09-09 NOTE — Telephone Encounter (Signed)
CALLED PATIENT TO REMIND OF PROCEDURE FOR 09-12-19, LVM FOR A RETURN CALL

## 2019-09-12 ENCOUNTER — Ambulatory Visit (HOSPITAL_BASED_OUTPATIENT_CLINIC_OR_DEPARTMENT_OTHER): Payer: 59 | Admitting: Certified Registered"

## 2019-09-12 ENCOUNTER — Ambulatory Visit (HOSPITAL_BASED_OUTPATIENT_CLINIC_OR_DEPARTMENT_OTHER)
Admission: RE | Admit: 2019-09-12 | Discharge: 2019-09-12 | Disposition: A | Discharge status: Home / Self Care | Payer: 59 | Attending: Urology | Admitting: Urology

## 2019-09-12 ENCOUNTER — Encounter (HOSPITAL_BASED_OUTPATIENT_CLINIC_OR_DEPARTMENT_OTHER)
Admission: RE | Disposition: A | Discharge status: Home / Self Care | Payer: Self-pay | Source: Home / Self Care | Attending: Urology

## 2019-09-12 ENCOUNTER — Ambulatory Visit (HOSPITAL_COMMUNITY): Payer: 59

## 2019-09-12 ENCOUNTER — Ambulatory Visit (HOSPITAL_BASED_OUTPATIENT_CLINIC_OR_DEPARTMENT_OTHER): Payer: 59 | Admitting: Physician Assistant

## 2019-09-12 ENCOUNTER — Encounter (HOSPITAL_BASED_OUTPATIENT_CLINIC_OR_DEPARTMENT_OTHER): Payer: Self-pay | Admitting: Emergency Medicine

## 2019-09-12 ENCOUNTER — Other Ambulatory Visit: Payer: Self-pay

## 2019-09-12 DIAGNOSIS — I872 Venous insufficiency (chronic) (peripheral): Secondary | ICD-10-CM | POA: Diagnosis not present

## 2019-09-12 DIAGNOSIS — I10 Essential (primary) hypertension: Secondary | ICD-10-CM | POA: Insufficient documentation

## 2019-09-12 DIAGNOSIS — Z7982 Long term (current) use of aspirin: Secondary | ICD-10-CM | POA: Diagnosis not present

## 2019-09-12 DIAGNOSIS — E119 Type 2 diabetes mellitus without complications: Secondary | ICD-10-CM | POA: Diagnosis not present

## 2019-09-12 DIAGNOSIS — C61 Malignant neoplasm of prostate: Secondary | ICD-10-CM | POA: Insufficient documentation

## 2019-09-12 DIAGNOSIS — Z79899 Other long term (current) drug therapy: Secondary | ICD-10-CM | POA: Insufficient documentation

## 2019-09-12 DIAGNOSIS — Z7984 Long term (current) use of oral hypoglycemic drugs: Secondary | ICD-10-CM | POA: Diagnosis not present

## 2019-09-12 DIAGNOSIS — E785 Hyperlipidemia, unspecified: Secondary | ICD-10-CM | POA: Diagnosis not present

## 2019-09-12 HISTORY — PX: SPACE OAR INSTILLATION: SHX6769

## 2019-09-12 HISTORY — DX: Type 2 diabetes mellitus without complications: E11.9

## 2019-09-12 HISTORY — DX: Venous insufficiency (chronic) (peripheral): I87.2

## 2019-09-12 HISTORY — PX: RADIOACTIVE SEED IMPLANT: SHX5150

## 2019-09-12 HISTORY — PX: CYSTOSCOPY: SHX5120

## 2019-09-12 HISTORY — DX: Prediabetes: R73.03

## 2019-09-12 LAB — GLUCOSE, CAPILLARY
Glucose-Capillary: 151 mg/dL — ABNORMAL HIGH (ref 70–99)
Glucose-Capillary: 164 mg/dL — ABNORMAL HIGH (ref 70–99)

## 2019-09-12 SURGERY — INSERTION, RADIATION SOURCE, PROSTATE
Anesthesia: General | Site: Rectum

## 2019-09-12 MED ORDER — IOHEXOL 300 MG/ML  SOLN
INTRAMUSCULAR | Status: DC | PRN
  Administered 2019-09-12: 7 mL

## 2019-09-12 MED ORDER — FENTANYL CITRATE (PF) 250 MCG/5ML IJ SOLN
INTRAMUSCULAR | Status: AC
  Filled 2019-09-12: qty 5

## 2019-09-12 MED ORDER — FLEET ENEMA 7-19 GM/118ML RE ENEM
1.0000 | ENEMA | Freq: Once | RECTAL | Status: DC
  Filled 2019-09-12: qty 1

## 2019-09-12 MED ORDER — PROPOFOL 10 MG/ML IV BOLUS
INTRAVENOUS | Status: AC
  Filled 2019-09-12: qty 20

## 2019-09-12 MED ORDER — SODIUM CHLORIDE 0.9 % IR SOLN
Status: DC | PRN
  Administered 2019-09-12: 1000 mL via INTRAVESICAL

## 2019-09-12 MED ORDER — LIDOCAINE 2% (20 MG/ML) 5 ML SYRINGE
INTRAMUSCULAR | Status: DC | PRN
  Administered 2019-09-12: 100 mg via INTRAVENOUS

## 2019-09-12 MED ORDER — PROPOFOL 10 MG/ML IV BOLUS
INTRAVENOUS | Status: DC | PRN
  Administered 2019-09-12: 200 mg via INTRAVENOUS

## 2019-09-12 MED ORDER — EPHEDRINE SULFATE-NACL 50-0.9 MG/10ML-% IV SOSY
PREFILLED_SYRINGE | INTRAVENOUS | Status: DC | PRN
  Administered 2019-09-12: 5 mg via INTRAVENOUS
  Administered 2019-09-12: 2.5 mg via INTRAVENOUS
  Administered 2019-09-12 (×3): 5 mg via INTRAVENOUS
  Administered 2019-09-12: 15 mg via INTRAVENOUS
  Administered 2019-09-12 (×2): 5 mg via INTRAVENOUS
  Administered 2019-09-12: 2.5 mg via INTRAVENOUS

## 2019-09-12 MED ORDER — LACTATED RINGERS IV SOLN
INTRAVENOUS | Status: DC
  Administered 2019-09-12 (×2): via INTRAVENOUS
  Filled 2019-09-12: qty 1000

## 2019-09-12 MED ORDER — PHENYLEPHRINE HCL (PRESSORS) 10 MG/ML IV SOLN
INTRAVENOUS | Status: AC
  Filled 2019-09-12: qty 1

## 2019-09-12 MED ORDER — MIDAZOLAM HCL 2 MG/2ML IJ SOLN
INTRAMUSCULAR | Status: DC | PRN
  Administered 2019-09-12 (×2): 1 mg via INTRAVENOUS

## 2019-09-12 MED ORDER — CIPROFLOXACIN IN D5W 400 MG/200ML IV SOLN
400.0000 mg | INTRAVENOUS | Status: AC
  Administered 2019-09-12: 400 mg via INTRAVENOUS
  Filled 2019-09-12: qty 200

## 2019-09-12 MED ORDER — SODIUM CHLORIDE 0.9 % IV SOLN
INTRAVENOUS | Status: DC | PRN
  Administered 2019-09-12: 25 ug/min via INTRAVENOUS

## 2019-09-12 MED ORDER — CIPROFLOXACIN IN D5W 400 MG/200ML IV SOLN
INTRAVENOUS | Status: AC
  Filled 2019-09-12: qty 200

## 2019-09-12 MED ORDER — MEPERIDINE HCL 25 MG/ML IJ SOLN
6.2500 mg | INTRAMUSCULAR | Status: DC | PRN
  Filled 2019-09-12: qty 1

## 2019-09-12 MED ORDER — LIDOCAINE 2% (20 MG/ML) 5 ML SYRINGE
INTRAMUSCULAR | Status: AC
  Filled 2019-09-12: qty 5

## 2019-09-12 MED ORDER — MIDAZOLAM HCL 2 MG/2ML IJ SOLN
INTRAMUSCULAR | Status: AC
  Filled 2019-09-12: qty 2

## 2019-09-12 MED ORDER — ONDANSETRON HCL 4 MG/2ML IJ SOLN
INTRAMUSCULAR | Status: DC | PRN
  Administered 2019-09-12: 4 mg via INTRAVENOUS

## 2019-09-12 MED ORDER — STERILE WATER FOR INJECTION IJ SOLN
INTRAMUSCULAR | Status: DC | PRN
  Administered 2019-09-12: 3 mL

## 2019-09-12 MED ORDER — PROMETHAZINE HCL 25 MG/ML IJ SOLN
6.2500 mg | INTRAMUSCULAR | Status: DC | PRN
  Filled 2019-09-12: qty 1

## 2019-09-12 MED ORDER — OXYCODONE HCL 5 MG PO TABS
5.0000 mg | ORAL_TABLET | Freq: Once | ORAL | Status: DC | PRN
  Filled 2019-09-12: qty 1

## 2019-09-12 MED ORDER — OXYCODONE HCL 5 MG/5ML PO SOLN
5.0000 mg | Freq: Once | ORAL | Status: DC | PRN
  Filled 2019-09-12: qty 5

## 2019-09-12 MED ORDER — HYDROMORPHONE HCL 1 MG/ML IJ SOLN
INTRAMUSCULAR | Status: AC
  Filled 2019-09-12: qty 1

## 2019-09-12 MED ORDER — ONDANSETRON HCL 4 MG/2ML IJ SOLN
INTRAMUSCULAR | Status: AC
  Filled 2019-09-12: qty 2

## 2019-09-12 MED ORDER — HYDROCODONE-ACETAMINOPHEN 5-325 MG PO TABS: 1.0000 | ORAL_TABLET | ORAL | 0 refills | Status: DC | PRN

## 2019-09-12 MED ORDER — HYDROMORPHONE HCL 1 MG/ML IJ SOLN
0.2500 mg | INTRAMUSCULAR | Status: DC | PRN
  Administered 2019-09-12: 09:00:00 0.25 mg via INTRAVENOUS
  Filled 2019-09-12: qty 0.5

## 2019-09-12 MED ORDER — EPHEDRINE 5 MG/ML INJ
INTRAVENOUS | Status: AC
  Filled 2019-09-12: qty 10

## 2019-09-12 MED ORDER — SODIUM CHLORIDE (PF) 0.9 % IJ SOLN
INTRAMUSCULAR | Status: DC | PRN
  Administered 2019-09-12: 10 mL

## 2019-09-12 MED ORDER — FENTANYL CITRATE (PF) 250 MCG/5ML IJ SOLN
INTRAMUSCULAR | Status: DC | PRN
  Administered 2019-09-12 (×4): 50 ug via INTRAVENOUS

## 2019-09-12 SURGICAL SUPPLY — 33 items
BAG URINE DRAINAGE (UROLOGICAL SUPPLIES) ×4 IMPLANT
BLADE CLIPPER SENSICLIP SURGIC (BLADE) ×4 IMPLANT
CATH FOLEY 2WAY SLVR  5CC 16FR (CATHETERS) ×1
CATH FOLEY 2WAY SLVR 5CC 16FR (CATHETERS) ×3 IMPLANT
CATH ROBINSON RED A/P 16FR (CATHETERS) IMPLANT
CATH ROBINSON RED A/P 20FR (CATHETERS) ×4 IMPLANT
CLOTH BEACON ORANGE TIMEOUT ST (SAFETY) ×3 IMPLANT
CONT SPECI 4OZ STER CLIK (MISCELLANEOUS) ×8 IMPLANT
COVER BACK TABLE 60X90IN (DRAPES) ×4 IMPLANT
COVER MAYO STAND STRL (DRAPES) ×4 IMPLANT
DRSG TEGADERM 4X4.75 (GAUZE/BANDAGES/DRESSINGS) ×7 IMPLANT
DRSG TEGADERM 8X12 (GAUZE/BANDAGES/DRESSINGS) ×7 IMPLANT
GAUZE SPONGE 4X4 12PLY STRL LF (GAUZE/BANDAGES/DRESSINGS) ×1 IMPLANT
GLOVE BIO SURGEON STRL SZ7.5 (GLOVE) ×4 IMPLANT
GLOVE BIO SURGEON STRL SZ8 (GLOVE) IMPLANT
GLOVE SURG ORTHO 8.5 STRL (GLOVE) ×6 IMPLANT
GLOVE SURG SS PI 6.5 STRL IVOR (GLOVE) IMPLANT
GOWN STRL REUS W/TWL LRG LVL3 (GOWN DISPOSABLE) ×4 IMPLANT
GOWN STRL REUS W/TWL XL LVL3 (GOWN DISPOSABLE) ×4 IMPLANT
HOLDER FOLEY CATH W/STRAP (MISCELLANEOUS) IMPLANT
I-Seed AgX100 ×54 IMPLANT
IMPL SPACEOAR SYSTEM 10ML (Spacer) IMPLANT
IMPLANT SPACEOAR SYSTEM 10ML (Spacer) ×4 IMPLANT
IV SOD CHL 0.9% 1000ML (IV SOLUTION) ×4 IMPLANT
KIT TURNOVER CYSTO (KITS) ×4 IMPLANT
MARKER SKIN DUAL TIP RULER LAB (MISCELLANEOUS) ×4 IMPLANT
PACK CYSTO (CUSTOM PROCEDURE TRAY) ×4 IMPLANT
SURGILUBE 2OZ TUBE FLIPTOP (MISCELLANEOUS) ×4 IMPLANT
SUT BONE WAX W31G (SUTURE) IMPLANT
SYR 10ML LL (SYRINGE) ×4 IMPLANT
TOWEL OR 17X26 10 PK STRL BLUE (TOWEL DISPOSABLE) ×4 IMPLANT
UNDERPAD 30X30 (UNDERPADS AND DIAPERS) ×8 IMPLANT
WATER STERILE IRR 500ML POUR (IV SOLUTION) ×4 IMPLANT

## 2019-09-12 NOTE — Op Note (Signed)
PATIENT:  Andrew [name]  PRE-OPERATIVE DIAGNOSIS:  Adenocarcinoma of the prostate  POST-OPERATIVE DIAGNOSIS:  Same  PROCEDURE:  1. I-125 radioactive seed implantation 2. Cystoscopy  3. Placement of SpaceOAR  SURGEON:  Surgeon(s): Wendie Simmer, MD  Radiation oncologist: Dr. Tyler Pita  ANESTHESIA:  General  EBL:  Minimal  DRAINS: 32 French Foley catheter  INDICATION: Andrew [name]  Description of procedure: After informed consent the patient was brought to the major OR, placed on the table and administered general anesthesia. He was then moved to the modified lithotomy position with his perineum perpendicular to the floor. His perineum and genitalia were then sterilely prepped. An official timeout was then performed. A 16 French Foley catheter was then placed in the bladder and filled with dilute contrast, a rectal tube was placed in the rectum and the transrectal ultrasound probe was placed in the rectum and affixed to the stand. He was then sterilely draped.  Real time ultrasonography was used along with the seed planning software [employer]state. This was used to develop the seed plan including the number of needles as well as number of seeds required for complete and adequate coverage. Real-time ultrasonography was then used along with the previously developed plan and the Nucletron device to implant a total of 54 seeds using 18 needles. This proceeded without difficulty or complication.   I then proceeded with placement of SpaceOARby introducing a needle with the bevel angled inferiorly approximately 2 cm superior to the anus. This was angled downward and under direct ultrasound was placed within the space between the prostatic capsule and rectum. This was confirmed with a small amount of sterile saline injected and this was performed under direct ultrasound. I then attached the SpaceOARto the needle and injected this in the space between the prostate and rectum with  good placement noted.  A Foley catheter was then removed as well as the transrectal ultrasound probe and rectal probe. Flexible cystoscopy was then performed using the 17 French flexible scope which revealed a normal urethra throughout its length down to the sphincter which appeared intact. The prostatic urethra revealed bilobar hypertrophy with mild obstruction but no seeds, spacers or lesions. The bladder was then entered and fully and systematically inspected. The ureteral orifices were noted to be of normal configuration and position. The mucosa revealed no evidence of tumors. There were also no stones identified within the bladder. I noted no seeds or spacers on the floor of the bladder and retroflexion of the scope revealed no seeds protruding from the base of the prostate.  The cystoscope was then removed and the patient was awakened and taken to recovery room in stable and satisfactory condition. He tolerated procedure well and there were no intraoperative complications.

## 2019-09-12 NOTE — H&P (Signed)
H&P  Chief Complaint: Prostate cancer  History of Present Illness: 63 year old male with prostate cancer elected to undergo brachytherapy with spaceoar.  Past Medical History:  Diagnosis Date  . Chronic venous insufficiency    right leg, swells a small bit has some discoloration seeing primary care md for has not been officiall dx yet  . Diabetes mellitus without complication ([place])   . Hyperlipemia   . Hypertension   . Pre-diabetes    on januvia for  . Prostate cancer [facility])    Past Surgical History:  Procedure Laterality Date  . CIRCUMCISION    . COLONOSCOPY    . PROSTATE BIOPSY  06/23/2019    Home Medications:  Medications Prior to Admission  Medication Sig Dispense Refill Last Dose  . amLODipine (NORVASC) 5 MG tablet Take 1 tablet (5 mg total) by mouth daily. 90 tablet 1 09/11/2019 at Unknown time  . aspirin 81 MG tablet Take 81 mg by mouth daily.     Past Month at 800  . Azilsartan-Chlorthalidone 40-25 MG TABS Take by mouth daily.   09/11/2019 at Unknown time  . finasteride (PROSCAR) 5 MG tablet Take 5 mg by mouth daily.   09/11/2019 at Unknown time  . furosemide (LASIX) 20 MG tablet Take 1 tablet (20 mg total) by mouth daily. 90 tablet 1 09/11/2019 at Unknown time  . JANUVIA 50 MG tablet Take 50 mg by mouth daily.   09/11/2019 at Unknown time  . Multiple Vitamins-Minerals (MULTIVITAMIN WITH MINERALS) tablet Take 1 tablet by mouth daily. CENTRUM   09/11/2019 at Unknown time  . pravastatin (PRAVACHOL) 10 MG tablet Take 1 tablet (10 mg total) by mouth daily. (Patient taking differently: Take 10 mg by mouth every evening. ) 90 tablet 1 09/11/2019 at Unknown time  . tamsulosin (FLOMAX) 0.4 MG CAPS capsule Take 0.4 mg by mouth daily.   09/11/2019 at Unknown time  . sildenafil (VIAGRA) 25 MG tablet Take 25 mg by mouth daily as needed for erectile dysfunction.   More than a month at Unknown time   Allergies: No Known Allergies  Family History  Problem Relation Age of Onset  . Vaginal  cancer Mother   . Bladder Cancer Mother   . Rectal cancer Mother   . Lung cancer Father   . Breast cancer Sister   . Colon cancer Sister   . Prostate cancer Neg Hx    Social History:  reports that he has never smoked. He has never used smokeless tobacco. He reports current alcohol use. He reports that he does not use drugs.  ROS: A complete review of systems was performed.  All systems are negative except for pertinent findings as noted. ROS   Physical Exam:  Vital signs in last 24 hours: Temp:  [97.8 F (36.6 C)] 97.8 F (36.6 C) (09/21 0600) Pulse Rate:  [67] 67 (09/21 0600) Resp:  [16] 16 (09/21 0600) BP: (144)/(92) 144/92 (09/21 0600) SpO2:  [100 %] 100 % (09/21 0600) Weight:  [129.3 kg] 129.3 kg (09/21 0600) General:  Alert and oriented, No acute distress HEENT: Normocephalic, atraumatic Neck: No JVD or lymphadenopathy Cardiovascular: Regular rate and rhythm Lungs: Regular rate and effort Abdomen: Soft, nontender, nondistended, no abdominal masses Back: No CVA tenderness Extremities: No edema Neurologic: Grossly intact  Laboratory Data:  Results for orders placed or performed during the hospital encounter of 09/12/19 (from the past 24 hour(s))  Glucose, capillary     Status: Abnormal   Collection Time: 09/12/19  5:55 AM  Result Value Ref Range   Glucose-Capillary 151 (H) 70 - 99 mg/dL   Recent Results (from the past 240 hour(s))  Novel Coronavirus, NAA (Hosp order, Send-out to Ref Lab; TAT 18-24 hrs     Status: None   Collection Time: 09/08/19  8:40 AM   Specimen: Nasopharyngeal Swab; Respiratory  Result Value Ref Range Status   SARS-CoV-2, NAA NOT DETECTED NOT DETECTED Final    Comment: (NOTE) This nucleic acid amplification test was developed and its performance characteristics determined by [employer]. Nucleic acid amplification tests include PCR and TMA. This test has not been FDA cleared or approved. This test has been authorized by FDA under  an Emergency Use Authorization (EUA). This test is only authorized for the duration of time the declaration that circumstances exist justifying the authorization of the emergency use of in vitro diagnostic tests for detection of SARS-CoV-2 virus and/or diagnosis of COVID-19 infection under section 564(b)(1) of the Act, 21 U.S.C. [id number]) (1), unless the authorization is terminated or revoked sooner. When diagnostic testing is negative, the possibility of a false negative result should be considered in the context of a patient's recent exposures and the presence of clinical signs and symptoms consistent with COVID-19. An individual without symptoms of COVID- 19 and who is not shedding SARS-CoV-2 vi rus would expect to have a negative (not detected) result in this assay. Performed At: [facility] [address] [place], [place] [id number] Rush Farmer MD [id number]    [place]  Final    Comment: Performed at [facility], [place] [address], [place], [place] [address]   Creatinine: Recent Labs    09/08/19 0802  CREATININE 1.45*    Impression/Assessment:  Prostate cancer  Plan:  Proceed with brachytherapy with spaceoar  Marton Redwood, III 09/12/2019, 7:35 AM

## 2019-09-12 NOTE — Anesthesia Preprocedure Evaluation (Signed)
Anesthesia Evaluation  Patient identified by MRN, date of birth, ID band Patient awake    Reviewed: Allergy & Precautions, NPO status , Patient's Chart, lab work & pertinent test results  Airway Mallampati: II  TM Distance: >3 FB Neck ROM: Full    Dental no notable dental hx.    Pulmonary neg pulmonary ROS,    Pulmonary exam normal breath sounds clear to auscultation       Cardiovascular hypertension, Pt. on medications negative cardio ROS Normal cardiovascular exam Rhythm:Regular Rate:Normal     Neuro/Psych negative neurological ROS  negative psych ROS   GI/Hepatic negative GI ROS, Neg liver ROS,   Endo/Other  negative endocrine ROSdiabetes, Type 2  Renal/GU negative Renal ROS  negative genitourinary   Musculoskeletal negative musculoskeletal ROS (+)   Abdominal (+) + obese,   Peds negative pediatric ROS (+)  Hematology negative hematology ROS (+)   Anesthesia Other Findings Prostate Cancer  Reproductive/Obstetrics negative OB ROS                             Anesthesia Physical Anesthesia Plan  ASA: III  Anesthesia Plan: General   Post-op Pain Management:    Induction: Intravenous  PONV Risk Score and Plan: 2 and Ondansetron, Midazolam and Treatment may vary due to age or medical condition  Airway Management Planned: LMA  Additional Equipment:   Intra-op Plan:   Post-operative Plan: Extubation in OR  Informed Consent: I have reviewed the patients History and Physical, chart, labs and discussed the procedure including the risks, benefits and alternatives for the proposed anesthesia with the patient or authorized representative who has indicated his/her understanding and acceptance.     Dental advisory given  Plan Discussed with: CRNA  Anesthesia Plan Comments:         Anesthesia Quick Evaluation

## 2019-09-12 NOTE — Transfer of Care (Signed)
Immediate Anesthesia Transfer of Care Note  Patient: Andrew [name]  Procedure(s) Performed: RADIOACTIVE SEED IMPLANT/BRACHYTHERAPY IMPLANT (N/A Prostate) SPACE OAR INSTILLATION (N/A Rectum) CYSTOSCOPY (N/A Bladder)  Patient Location: PACU  Anesthesia Type:General  Level of Consciousness: awake and alert   Airway & Oxygen Therapy: Patient Spontanous Breathing and Patient connected to face mask oxygen  Post-op Assessment: Report given to RN and Post -op Vital signs reviewed and stable  Post vital signs: Reviewed and stable  Last Vitals:  Vitals Value Taken Time  BP 137/74 09/12/19 0900  Temp 36.6 C 09/12/19 0858  Pulse 80 09/12/19 0901  Resp 18 09/12/19 0901  SpO2 100 % 09/12/19 0901  Vitals shown include unvalidated device data.  Last Pain:  Vitals:   09/12/19 0858  TempSrc: Oral      Patients Stated Pain Goal: 4 ([id number])  Complications: No apparent anesthesia complications

## 2019-09-12 NOTE — Anesthesia Postprocedure Evaluation (Signed)
Anesthesia Post Note  Patient: MOHMMED DHARIA  Procedure(s) Performed: RADIOACTIVE SEED IMPLANT/BRACHYTHERAPY IMPLANT (N/A Prostate) SPACE OAR INSTILLATION (N/A Rectum) CYSTOSCOPY (N/A Bladder)     Patient location during evaluation: PACU Anesthesia Type: General Level of consciousness: awake and alert Pain management: pain level controlled Vital Signs Assessment: post-procedure vital signs reviewed and stable Respiratory status: spontaneous breathing, nonlabored ventilation and respiratory function stable Cardiovascular status: blood pressure returned to baseline and stable Postop Assessment: no apparent nausea or vomiting Anesthetic complications: no    Last Vitals:  Vitals:   09/12/19 0915 09/12/19 0930  BP: 134/75 119/72  Pulse: 73 68  Resp: 19 20  Temp:    SpO2: 100% 94%    Last Pain:  Vitals:   09/12/19 1000  TempSrc:   PainSc: 0-No pain                 Lynda Rainwater

## 2019-09-12 NOTE — Anesthesia Procedure Notes (Signed)
Date/Time: 09/12/2019 8:50 AM Performed by: Cynda Familia, CRNA Oxygen Delivery Method: Simple face mask Placement Confirmation: positive ETCO2 and breath sounds checked- equal and bilateral Dental Injury: Teeth and Oropharynx as per pre-operative assessment

## 2019-09-12 NOTE — Discharge Instructions (Signed)
Antibiotics °You may be given a prescription for an antibiotic to take when you arrive home. If so, be sure to take every tablet in the bottle, even if you are feeling better before the prescription is finished. If you begin itching, notice a rash or start to swell on your trunk, arms, legs and/or throat, immediately stop taking the antibiotic and call your Urologist. °Diet °Resume your usual diet when you return home. To keep your bowels moving easily and softly, drink prune, apple and cranberry juice at room temperature. You may also take a stool softener, such as Colace, which is available without prescription at local pharmacies. °Daily activities °? No driving or heavy lifting for at least two days after the implant. °? No bike riding, horseback riding or riding lawn mowers for the first month after the implant. °? Any strenuous physical activity should be approved by your doctor before you resume it. °Sexual relations °You may resume sexual relations two weeks after the procedure. A condom should be used for the first two weeks. Your semen may be dark brown or black; this is normal and is related bleeding that may have occurred during the implant. °Postoperative swelling °Expect swelling and bruising of the scrotum and perineum (the area between the scrotum and anus). Both the swelling and the bruising should resolve in l or 2 weeks. Ice packs and over- the-counter medications such as Tylenol, Advil or Aleve may lessen your discomfort. °Postoperative urination °Most men experience burning on urination and/or urinary frequency. If this becomes bothersome, contact your Urologist.  Medication can be prescribed to relieve these problems.  It is normal to have some blood in your urine for a few days after the implant. °Special instructions related to the seeds °It is unlikely that you will pass an Iodine-125 seed in your urine. The seeds are silver in color and are about as large as a grain of rice. If you pass a  seed, do not handle it with your fingers. Use a spoon to place it in an envelope or jar in place this in base occluded area such as the garage or basement for return to the radiation clinic at your convenience. ° °Contact your doctor for °? Temperature greater than 101 F °? Increasing pain °? Inability to urinate °Follow-up ° You should have follow up with your urologist and radiation oncologist about 3 weeks after the procedure. °General information regarding Iodine seeds °? Iodine-125 is a low energy radioactive material. It is not deeply penetrating and loses energy at short distances. Your prostate will absorb the radiation. Objects that are touched or used by the patient do not become radioactive. °? Body wastes (urine and stool) or body fluids (saliva, tears, semen or blood) are not radioactive. °? The Nuclear Regulatory Commission (NRC) has determined that no radiation precautions are needed for patients undergoing Iodine-125 seed implantation. The NRC states that such patients do not present a risk to the people around them, including young children and pregnant women. However, in keeping with the general principle that radiation exposure should be kept as low reasonably possible, we suggest the following: °? Children and pets should not sit on the patient's lap for the first two (2) weeks after the implant. °? Pregnant (or possibly pregnant) women should avoid prolonged, close contact with the patient for the first two (2) weeks after the implant. °? A distance of three (3) feet is acceptable. °? At a distance of three (3) feet, there is no limit to the   length of time anyone can be with the patient. ° ° °Post Anesthesia Home Care Instructions ° °Activity: °Get plenty of rest for the remainder of the day. A responsible adult should stay with you for 24 hours following the procedure.  °For the next 24 hours, DO NOT: °-Drive a car °-Operate machinery °-Drink alcoholic beverages °-Take any medication unless  instructed by your physician °-Make any legal decisions or sign important papers. ° °Meals: °Start with liquid foods such as gelatin or soup. Progress to regular foods as tolerated. Avoid greasy, spicy, heavy foods. If nausea and/or vomiting occur, drink only clear liquids until the nausea and/or vomiting subsides. Call your physician if vomiting continues. ° °Special Instructions/Symptoms: °Your throat may feel dry or sore from the anesthesia or the breathing tube placed in your throat during surgery. If this causes discomfort, gargle with warm salt water. The discomfort should disappear within 24 hours. ° °If you had a scopolamine patch placed behind your ear for the management of post- operative nausea and/or vomiting: ° °1. The medication in the patch is effective for 72 hours, after which it should be removed.  Wrap patch in a tissue and discard in the trash. Wash hands thoroughly with soap and water. °2. You may remove the patch earlier than 72 hours if you experience unpleasant side effects which may include dry mouth, dizziness or visual disturbances. °3. Avoid touching the patch. Wash your hands with soap and water after contact with the patch. °  ° °

## 2019-09-12 NOTE — Anesthesia Procedure Notes (Signed)
Procedure Name: LMA Insertion Date/Time: 09/12/2019 7:41 AM Performed by: Cynda Familia, CRNA Pre-anesthesia Checklist: Patient identified, Emergency Drugs available, Suction available and Patient being monitored Patient Re-evaluated:Patient Re-evaluated prior to induction Oxygen Delivery Method: Circle System Utilized Preoxygenation: Pre-oxygenation with 100% oxygen Induction Type: IV induction Ventilation: Mask ventilation without difficulty LMA: LMA inserted LMA Size: 4.0 Number of attempts: 1 Placement Confirmation: positive ETCO2 Tube secured with: Tape Dental Injury: Teeth and Oropharynx as per pre-operative assessment  Comments: Smooth IV induction Miller-- LMA insertion AM CRNA atraumatic-- teeth and mouth as preop-- chipped left front tooth present preop-- bilat BS

## 2019-09-13 ENCOUNTER — Encounter (HOSPITAL_BASED_OUTPATIENT_CLINIC_OR_DEPARTMENT_OTHER): Payer: Self-pay | Admitting: Urology

## 2019-09-15 ENCOUNTER — Encounter: Payer: Self-pay | Admitting: Radiation Oncology

## 2019-09-16 NOTE — Progress Notes (Signed)
  Radiation Oncology         [phone] ________________________________  Name: Andrew [name] MRN: [id number]  Date: 09/16/2019  DOB: Apr 13, 1956       Prostate Seed Implant  [id number], [name], MD  No ref. provider found  DIAGNOSIS: 63 y.o. gentleman with Stage T1c adenocarcinoma of the prostate with Gleason score of 4+3, and PSA of 5.30.    ICD-10-CM   1. Prostate cancer ([place])  C61 DG Chest 2 View    DG Chest 2 View    PROCEDURE: Insertion of radioactive I-125 seeds into the prostate gland.  RADIATION DOSE: 145 Gy, definitive therapy.  TECHNIQUE: [name] was brought to the operating room with the urologist. He was placed in the dorsolithotomy position. He was catheterized and a rectal tube was inserted. The perineum was shaved, prepped and draped. The ultrasound probe was then introduced into the rectum to see the prostate gland.  TREATMENT DEVICE: A needle grid was attached to the ultrasound probe stand and anchor needles were placed.  3D PLANNING: The prostate was imaged in 3D using a sagittal sweep of the prostate probe. These images were transferred to the planning computer. There, the prostate, urethra and rectum were defined on each axial reconstructed image. Then, the software created an optimized 3D plan and a few seed positions were adjusted. The quality of the plan was reviewed using [facility] information for the target and the following two organs at risk:  Urethra and Rectum.  Then the accepted plan was printed and handed off to the radiation therapist.  Under my supervision, the custom loading of the seeds and spacers was carried out and loaded into sealed vicryl sleeves.  These pre-loaded needles were then placed into the needle holder.Marland Kitchen  PROSTATE VOLUME STUDY:  Using transrectal ultrasound the volume of the prostate was verified to be 43 cc.  SPECIAL TREATMENT PROCEDURE/SUPERVISION AND HANDLING: The pre-loaded needles were then delivered under sagittal guidance. A  total of 18 needles were used to deposit 54 seeds in the prostate gland. The individual seed activity was 0.591 mCi.  SpaceOAR:  Yes  COMPLEX SIMULATION: At the end of the procedure, an anterior radiograph of the pelvis was obtained to document seed positioning and count. Cystoscopy was performed to check the urethra and bladder.  MICRODOSIMETRY: At the end of the procedure, the patient was emitting 0.091 mR/hr at 1 meter. Accordingly, he was considered safe for hospital discharge.  PLAN: The patient will return to the radiation oncology clinic for post implant CT dosimetry in three weeks.   ________________________________  Sheral Apley Tammi Klippel, M.D.

## 2019-10-05 ENCOUNTER — Telehealth: Payer: Self-pay | Admitting: *Deleted

## 2019-10-05 NOTE — Telephone Encounter (Signed)
Called patient to remind of post seed appts. for 10-06-19 and his MRI for 10-07-19, lvm for a return call

## 2019-10-06 ENCOUNTER — Ambulatory Visit
Admission: RE | Admit: 2019-10-06 | Discharge: 2019-10-06 | Disposition: A | Discharge status: Home / Self Care | Payer: 59 | Source: Ambulatory Visit | Attending: Radiation Oncology | Admitting: Radiation Oncology

## 2019-10-06 ENCOUNTER — Encounter: Payer: Self-pay | Admitting: Medical Oncology

## 2019-10-06 ENCOUNTER — Other Ambulatory Visit: Payer: Self-pay

## 2019-10-06 ENCOUNTER — Ambulatory Visit
Admission: RE | Admit: 2019-10-06 | Discharge: 2019-10-06 | Disposition: A | Discharge status: Home / Self Care | Payer: 59 | Source: Ambulatory Visit | Attending: Urology | Admitting: Urology

## 2019-10-06 ENCOUNTER — Encounter: Payer: Self-pay | Admitting: Urology

## 2019-10-06 VITALS — BP 144/88 | HR 67 | Temp 98.2°F | Resp 20 | Wt 284.0 lb

## 2019-10-06 DIAGNOSIS — Z923 Personal history of irradiation: Secondary | ICD-10-CM | POA: Insufficient documentation

## 2019-10-06 DIAGNOSIS — Z79899 Other long term (current) drug therapy: Secondary | ICD-10-CM | POA: Diagnosis not present

## 2019-10-06 DIAGNOSIS — C61 Malignant neoplasm of prostate: Secondary | ICD-10-CM | POA: Insufficient documentation

## 2019-10-06 DIAGNOSIS — Z7982 Long term (current) use of aspirin: Secondary | ICD-10-CM | POA: Diagnosis not present

## 2019-10-06 NOTE — Progress Notes (Signed)
Andrew [name] is here for a post -seed follow-up appointment . Patient has a  MRI scheduled for 10/10/19. Patient states that he seen his urologist on October 13,2020  Dysuria Sometime if his stream is stop and go Hematuria  None Nocturia 2-4 depending on how much fluid he drinks Leakage  Sometime Urgency None Empties his bladder States that he does not always empty his bladder Stream Weak States that he is not empties his bowels  Completely,but states that he is taking a  stool softner Vitals:   10/06/19 0850  BP: (!) 144/88  Pulse: 67  Resp: 20  Temp: 98.2 F (36.8 C)  TempSrc: Temporal  SpO2: 100%  Weight: 284 lb (128.8 kg)

## 2019-10-06 NOTE — Progress Notes (Signed)
Radiation Oncology         [phone]) [phone] ________________________________  Name: Andrew [name] MRN: [id number]  Date: 10/06/2019  DOB: [birth year]  Post-Seed Follow-Up Visit Note  CC: [name], MD  [name], MD  Diagnosis:    63 y.o. gentleman with Stage T1c adenocarcinoma of the prostate with Gleason score of 4+3, and PSA of 5.30.    ICD-10-CM   1. Malignant neoplasm of prostate ([place])  C61     Interval Since Last Radiation:  3.5 weeks 09/12/19:  Insertion of radioactive I-125 seeds into the prostate gland; 145 Gy, definitive/boost therapy with placement of SpaceOAR gel.  Narrative:  The patient returns today for routine follow-up.  He is complaining of increased urinary frequency and urinary hesitation symptoms. He filled out a questionnaire regarding urinary function today providing and overall IPSS score of 18 characterizing his symptoms as moderate with frequency, urgency, hesitancy, intermittency, weak stream and nocturia 3x/night.  He reports only mild, occasional burning at the start of his stream which is gradually improving and he feels that he empties his bladder well on voiding most of the time.  His pre-implant score was 10. He denies any abdominal pain or bowel symptoms but continues taking a stool softener daily to prevent cosntipation.  ALLERGIES:  has No Known Allergies.  Meds: Current Outpatient Medications  Medication Sig Dispense Refill  . amLODipine (NORVASC) 10 MG tablet Taking Monday wed and friday    . aspirin 81 MG tablet Take 81 mg by mouth daily.      . Azilsartan-Chlorthalidone 40-25 MG TABS Take by mouth daily.    . finasteride (PROSCAR) 5 MG tablet Take 5 mg by mouth daily.    . furosemide (LASIX) 20 MG tablet Take 1 tablet (20 mg total) by mouth daily. 90 tablet 1  . JANUVIA 50 MG tablet Take 50 mg by mouth daily.    . Multiple Vitamins-Minerals (MULTIVITAMIN WITH MINERALS) tablet Take 1 tablet by mouth daily. CENTRUM    . pravastatin  (PRAVACHOL) 10 MG tablet Take 1 tablet (10 mg total) by mouth daily. (Patient taking differently: Take 10 mg by mouth every evening. ) 90 tablet 1  . sildenafil (VIAGRA) 25 MG tablet Take 25 mg by mouth daily as needed for erectile dysfunction.    . tamsulosin (FLOMAX) 0.4 MG CAPS capsule Take 0.4 mg by mouth daily.    Marland Kitchen amLODipine (NORVASC) 5 MG tablet Take 1 tablet (5 mg total) by mouth daily. (Patient not taking: Reported on 10/06/2019) 90 tablet 1  . HYDROcodone-acetaminophen (NORCO) 5-325 MG tablet Take 1 tablet by mouth every 4 (four) hours as needed for moderate pain. (Patient not taking: Reported on 10/06/2019) 8 tablet 0   No current facility-administered medications for this encounter.     Physical Findings: In general this is a well appearing African American male in no acute distress. He's alert and oriented x4 and appropriate throughout the examination. Cardiopulmonary assessment is negative for acute distress and he exhibits normal effort.   Lab Findings: Lab Results  Component Value Date   WBC 6.1 09/08/2019   HGB 12.8 (L) 09/08/2019   HCT 38.2 (L) 09/08/2019   MCV 91.6 09/08/2019   PLT 230 09/08/2019    Radiographic Findings:  Patient underwent CT imaging in our clinic for post implant dosimetry. The CT will be reviewed by Dr. Tammi Klippel to confirm there is an adequate distribution of radioactive seeds throughout the prostate gland and ensure that there are no seeds in or  near the rectum. His scheduled for prostate MRI at 7am on Monday 10/10/19 and those images will be fused with his CT images for further evaluation. We suspect the final radiation plan and dosimetry will show appropriate coverage of the prostate gland. He understands that we will call and inform him of any unexpected findings on further review of his imaging and dosimetry.  Impression/Plan:  63 y.o. gentleman with Stage T1c adenocarcinoma of the prostate with Gleason score of 4+3, and PSA of 5.30. The patient  is recovering from the effects of radiation. His urinary symptoms should gradually improve over the next 4-6 months. We talked about this today. He is encouraged by his improvement already and is otherwise pleased with his outcome. He continues taking finasteride and Flomax daily as prescribed. We also talked about long-term follow-up for prostate cancer following seed implant. He understands that ongoing PSA determinations and digital rectal exams will help perform surveillance to rule out disease recurrence. He had a recent follow up appointment with Jiles Crocker, NP on 10/04/19 and anticipates a follow up with Dr. Gloriann Loan or Winter in Dec. 2020 for repeat PSA. He understands what to expect with his PSA measures. Patient was also educated today about some of the long-term effects from radiation including a small risk for rectal bleeding and possibly erectile dysfunction. We talked about some of the general management approaches to these potential complications. However, I did encourage the patient to contact our office or return at any point if he has questions or concerns related to his previous radiation and prostate cancer.    Nicholos Johns, PA-C

## 2019-10-06 NOTE — Addendum Note (Signed)
Encounter addended by: Sherrlyn Hock, LPN on: [id number] AM  Actions taken: Charge Capture section accepted

## 2019-10-07 ENCOUNTER — Ambulatory Visit (HOSPITAL_COMMUNITY): Payer: 59

## 2019-10-10 ENCOUNTER — Ambulatory Visit (HOSPITAL_COMMUNITY)
Admission: RE | Admit: 2019-10-10 | Discharge: 2019-10-10 | Disposition: A | Discharge status: Home / Self Care | Payer: 59 | Source: Ambulatory Visit | Attending: Urology | Admitting: Urology

## 2019-10-10 ENCOUNTER — Other Ambulatory Visit: Payer: Self-pay

## 2019-10-10 DIAGNOSIS — C61 Malignant neoplasm of prostate: Secondary | ICD-10-CM | POA: Diagnosis not present

## 2019-10-12 NOTE — Progress Notes (Signed)
  Radiation Oncology         (336) [phone] ________________________________  Name: Andrew [name] MRN: [id number]  Date: 10/06/2019  DOB: 07/26/1956  COMPLEX SIMULATION NOTE  NARRATIVE:  The patient was brought to the [facility] today following prostate seed implantation approximately one month ago.  Identity was confirmed.  All relevant records and images related to the planned course of therapy were reviewed.  Then, the patient was set-up supine.  CT images were obtained.  The CT images were loaded into the planning software.  Then the prostate and rectum were contoured.  Treatment planning then occurred.  The implanted iodine 125 seeds were identified by the physics staff for projection of radiation distribution  I have requested : 3D Simulation  I have requested a DVH of the following structures: Prostate and rectum.    ________________________________  Sheral Apley Tammi Klippel, M.D.

## 2019-10-25 ENCOUNTER — Encounter: Payer: Self-pay | Admitting: Radiation Oncology

## 2019-11-05 NOTE — Progress Notes (Signed)
  Radiation Oncology         [phone] ________________________________  Name: Andrew [name] MRN: [id number]  Date: 10/25/2019  DOB: 01-07-1956  3D Planning Note   Prostate Brachytherapy Post-Implant Dosimetry  Diagnosis: 63 y.o. gentleman with Stage T1c adenocarcinoma of the prostate with Gleason score of 4+3, and PSA of 5.30  Narrative: On a previous date, Andrew [name] returned following prostate seed implantation for post implant planning. He underwent CT scan complex simulation to delineate the three-dimensional structures of the pelvis and demonstrate the radiation distribution.  Since that time, the seed localization, and complex isodose planning with dose volume histograms have now been completed.  Results:   Prostate Coverage - The dose of radiation delivered to the 90% or more of the prostate gland (D90) was 112.44% of the prescription dose. This exceeds our goal of greater than 90%. Rectal Sparing - The volume of rectal tissue receiving the prescription dose or higher was 0.0 cc. This falls under our thresholds tolerance of 1.0 cc.  Impression: The prostate seed implant appears to show adequate target coverage and appropriate rectal sparing.  Plan:  The patient will continue to follow with urology for ongoing PSA determinations. I would anticipate a high likelihood for local tumor control with minimal risk for rectal morbidity.  ________________________________  Sheral Apley Tammi Klippel, M.D.

## 2019-11-20 ENCOUNTER — Other Ambulatory Visit: Payer: Self-pay

## 2019-11-20 ENCOUNTER — Ambulatory Visit (HOSPITAL_COMMUNITY)
Admission: EM | Admit: 2019-11-20 | Discharge: 2019-11-20 | Disposition: A | Discharge status: Home / Self Care | Payer: 59 | Attending: Family Medicine | Admitting: Family Medicine

## 2019-11-20 ENCOUNTER — Encounter (HOSPITAL_COMMUNITY): Payer: Self-pay

## 2019-11-20 DIAGNOSIS — Z20828 Contact with and (suspected) exposure to other viral communicable diseases: Secondary | ICD-10-CM | POA: Insufficient documentation

## 2019-11-20 DIAGNOSIS — Z20822 Contact with and (suspected) exposure to covid-19: Secondary | ICD-10-CM

## 2019-11-20 NOTE — ED Provider Notes (Signed)
[facility]    CSN: [id number] Arrival date & time: 11/20/19  [id number]      History   Chief Complaint Chief Complaint  Patient presents with  . Covid Testing    HPI Andrew NOVOSEL is a 63 y.o. male.   Newton Pigg presents with requests for need to covid-19 testing. He was notified by his workplace that he may have been exposed to a coworker. He feels well currently and has no symptoms. No complaints today. History  Of pre-dm, htn.     ROS per HPI, negative if not otherwise mentioned.      Past Medical History:  Diagnosis Date  . Chronic venous insufficiency    right leg, swells a small bit has some discoloration seeing primary care md for has not been officiall dx yet  . Diabetes mellitus without complication ([place])   . Hyperlipemia   . Hypertension   . Pre-diabetes    on januvia for  . Prostate cancer [facility])     Patient Active Problem List   Diagnosis Date Noted  . Malignant neoplasm of prostate ([place]) 07/12/2019    Past Surgical History:  Procedure Laterality Date  . CIRCUMCISION    . COLONOSCOPY    . CYSTOSCOPY N/A 09/12/2019   Procedure: CYSTOSCOPY;  Surgeon: Lucas Mallow, MD;  Location: [facility];  Service: Urology;  Laterality: N/A;  No seeds detected in bladder per Dr. Gloriann Loan  . PROSTATE BIOPSY  06/23/2019  . RADIOACTIVE SEED IMPLANT N/A 09/12/2019   Procedure: RADIOACTIVE SEED IMPLANT/BRACHYTHERAPY IMPLANT;  Surgeon: Lucas Mallow, MD;  Location: [facility];  Service: Urology;  Laterality: N/A;  . SPACE OAR INSTILLATION N/A 09/12/2019   Procedure: SPACE OAR INSTILLATION;  Surgeon: Lucas Mallow, MD;  Location: [facility];  Service: Urology;  Laterality: N/A;       Home Medications    Prior to Admission medications   Medication Sig Start Date End Date Taking? Authorizing Provider  amLODipine (NORVASC) 10 MG tablet Taking Monday wed and friday 10/03/19   [provider]   amLODipine (NORVASC) 5 MG tablet Take 1 tablet (5 mg total) by mouth daily. Patient not taking: Reported on 10/06/2019 01/03/16   Konrad Felix, PA  aspirin 81 MG tablet Take 81 mg by mouth daily.      [provider]  Azilsartan-Chlorthalidone 40-25 MG TABS Take by mouth daily.    [provider]  finasteride (PROSCAR) 5 MG tablet Take 5 mg by mouth daily. 08/12/19   [provider]  furosemide (LASIX) 20 MG tablet Take 1 tablet (20 mg total) by mouth daily. 01/03/16   Konrad Felix, PA  HYDROcodone-acetaminophen (NORCO) 5-325 MG tablet Take 1 tablet by mouth every 4 (four) hours as needed for moderate pain. Patient not taking: Reported on 10/06/2019 09/12/19   Lucas Mallow, MD  JANUVIA 50 MG tablet Take 50 mg by mouth daily. 03/04/19   [provider]  Multiple Vitamins-Minerals (MULTIVITAMIN WITH MINERALS) tablet Take 1 tablet by mouth daily. CENTRUM    [provider]  pravastatin (PRAVACHOL) 10 MG tablet Take 1 tablet (10 mg total) by mouth daily. Patient taking differently: Take 10 mg by mouth every evening.  01/03/16   Konrad Felix, PA  sildenafil (VIAGRA) 25 MG tablet Take 25 mg by mouth daily as needed for erectile dysfunction.    [provider]  tamsulosin (FLOMAX) 0.4 MG CAPS  capsule Take 0.4 mg by mouth daily. 05/19/19   [provider]    Family History Family History  Problem Relation Age of Onset  . Vaginal cancer Mother   . Bladder Cancer Mother   . Rectal cancer Mother   . Lung cancer Father   . Breast cancer Sister   . Colon cancer Sister   . Prostate cancer Neg Hx     Social History Social History   Tobacco Use  . Smoking status: Never Smoker  . Smokeless tobacco: Never Used  Substance Use Topics  . Alcohol use: Yes    Comment: occ  . Drug use: No     Allergies   Patient has no known allergies.   Review of Systems Review of Systems   Physical Exam Triage Vital Signs ED  Triage Vitals  Enc Vitals Group     BP      Pulse      Resp      Temp      Temp src      SpO2      Weight      Height      Head Circumference      Peak Flow      Pain Score      Pain Loc      Pain Edu?      Excl. in [place]?    No data found.  Updated Vital Signs BP (!) 162/97 (BP Location: Right Arm) Comment: didnt take BP medication  Pulse 65   Temp 98.3 F (36.8 C) (Oral)   Resp 18   SpO2 98%    Physical Exam Constitutional:      Appearance: He is well-developed.  Cardiovascular:     Rate and Rhythm: Normal rate.  Pulmonary:     Effort: Pulmonary effort is normal.  Skin:    General: Skin is warm and dry.  Neurological:     Mental Status: He is alert and oriented to person, place, and time.      UC Treatments / Results  Labs (all labs ordered are listed, but only abnormal results are displayed) Labs Reviewed  NOVEL CORONAVIRUS, NAA (HOSP ORDER, SEND-OUT TO REF LAB; TAT 18-24 HRS)    EKG   Radiology No results found.  Procedures Procedures (including critical care time)  Medications Ordered in UC Medications - No data to display  Initial Impression / Assessment and Plan / UC Course  I have reviewed the triage vital signs and the nursing notes.  Pertinent labs & imaging results that were available during my care of the patient were reviewed by me and considered in my medical decision making (see chart for details).     Non toxic. Benign physical exam. No acute complaints today. covid-19 screening collected and pending. Isolation precautions discussed. Return precautions provided. Patient verbalized understanding and agreeable to plan.  Ambulatory out of clinic without difficulty.    Final Clinical Impressions(s) / UC Diagnoses   Final diagnoses:  Encounter for screening laboratory testing for COVID-19 virus  Exposure to COVID-19 virus     Discharge Instructions     Self isolate until covid results are back and negative.  Will notify you by  phone of any positive findings. Your negative results will be sent through your MyChart.     Please isolate if you develop symptoms of covid, may use video visit if needed.     ED Prescriptions    None     PDMP not reviewed  this encounter.   Zigmund Gottron, NP 11/20/19 1026

## 2019-11-20 NOTE — Discharge Instructions (Signed)
Self isolate until covid results are back and negative.  Will notify you by phone of any positive findings. Your negative results will be sent through your MyChart.     Please isolate if you develop symptoms of covid, may use video visit if needed.

## 2019-11-20 NOTE — ED Triage Notes (Signed)
Pt presents for covid testing after someone at his place of employment tested positive; pt not displaying any symptoms.

## 2019-11-22 LAB — NOVEL CORONAVIRUS, NAA (HOSP ORDER, SEND-OUT TO REF LAB; TAT 18-24 HRS): SARS-CoV-2, NAA: NOT DETECTED

## 2019-11-28 ENCOUNTER — Ambulatory Visit
Admission: EM | Admit: 2019-11-28 | Discharge: 2019-11-28 | Disposition: A | Discharge status: Home / Self Care | Payer: 59 | Attending: Physician Assistant | Admitting: Physician Assistant

## 2019-11-28 DIAGNOSIS — S161XXA Strain of muscle, fascia and tendon at neck level, initial encounter: Secondary | ICD-10-CM | POA: Diagnosis not present

## 2019-11-28 MED ORDER — MELOXICAM 7.5 MG PO TABS: 7.5000 mg | ORAL_TABLET | Freq: Every day | ORAL | 0 refills | Status: DC

## 2019-11-28 MED ORDER — TIZANIDINE HCL 4 MG PO TABS: 4.0000 mg | ORAL_TABLET | Freq: Four times a day (QID) | ORAL | 0 refills | Status: DC | PRN

## 2019-11-28 NOTE — Discharge Instructions (Signed)
Start Mobic. Do not take ibuprofen (motrin/advil)/ naproxen (aleve) while on mobic. Tizanidine as needed, this can make you drowsy, so do not take if you are going to drive, operate heavy machinery, or make important decisions. Ice/heat compresses as needed. This can take up to 3-4 weeks to completely resolve, but you should be feeling better each week. Follow up here or with PCP if symptoms worsen, changes for reevaluation. If loss of grip strength, go to the ED for further evaluation needed.

## 2019-11-28 NOTE — ED Provider Notes (Signed)
[facility]    CSN: [id number] Arrival date & time: 11/28/19  [id number]      History   Chief Complaint Chief Complaint  Patient presents with  . Back Pain    HPI Andrew [name] is a 63 y.o. male.   63 year old male comes in for 5-day history of bilateral neck/shoulder pain.  Denies injury/trauma.  States was sitting on the couch and fell asleep, woke up with the symptoms.  Pain is intermittent, worse with movement.  States depending on range of motion, pain can radiate down bilateral arms.  Has had intermittent numbness, tingling to bilateral fingers without obvious pattern.  Denies loss of grip strength.  Denies increase in activity/strenuous activity.  Has tried topical medications, Tylenol, hydrocodone with temporary relief.  Patient with both prediabetes and diabetes mellitus on past medical history.  Upon further questioning, patient states currently still prediabetic, takes Januvia for it.  Unknown last A1c.  Denies history of diabetic neuropathy.     Past Medical History:  Diagnosis Date  . Chronic venous insufficiency    right leg, swells a small bit has some discoloration seeing primary care md for has not been officiall dx yet  . Diabetes mellitus without complication ([place])   . Hyperlipemia   . Hypertension   . Pre-diabetes    on januvia for  . Prostate cancer [facility])     Patient Active Problem List   Diagnosis Date Noted  . Malignant neoplasm of prostate ([place]) 07/12/2019    Past Surgical History:  Procedure Laterality Date  . CIRCUMCISION    . COLONOSCOPY    . CYSTOSCOPY N/A 09/12/2019   Procedure: CYSTOSCOPY;  Surgeon: Lucas Mallow, MD;  Location: [facility];  Service: Urology;  Laterality: N/A;  No seeds detected in bladder per Dr. Gloriann Loan  . PROSTATE BIOPSY  06/23/2019  . RADIOACTIVE SEED IMPLANT N/A 09/12/2019   Procedure: RADIOACTIVE SEED IMPLANT/BRACHYTHERAPY IMPLANT;  Surgeon: Lucas Mallow, MD;  [facility];  Service: Urology;  Laterality: N/A;  . SPACE OAR INSTILLATION N/A 09/12/2019   Procedure: SPACE OAR INSTILLATION;  Surgeon: Lucas Mallow, MD;  Location: [facility];  Service: Urology;  Laterality: N/A;       Home Medications    Prior to Admission medications   Medication Sig Start Date End Date Taking? Authorizing Provider  amLODipine (NORVASC) 10 MG tablet Taking Monday wed and friday 10/03/19   [provider]  aspirin 81 MG tablet Take 81 mg by mouth daily.      [provider]  Azilsartan-Chlorthalidone 40-25 MG TABS Take by mouth daily.    [provider]  finasteride (PROSCAR) 5 MG tablet Take 5 mg by mouth daily. 08/12/19   [provider]  furosemide (LASIX) 20 MG tablet Take 1 tablet (20 mg total) by mouth daily. 01/03/16   Konrad Felix, PA  HYDROcodone-acetaminophen (NORCO) 5-325 MG tablet Take 1 tablet by mouth every 4 (four) hours as needed for moderate pain. Patient not taking: Reported on 10/06/2019 09/12/19   Lucas Mallow, MD  JANUVIA 50 MG tablet Take 50 mg by mouth daily. 03/04/19   [provider]  meloxicam (MOBIC) 7.5 MG tablet Take 1 tablet (7.5 mg total) by mouth daily. 11/28/19   Tasia Catchings, Ondrea Dow V, PA-C  Multiple Vitamins-Minerals (MULTIVITAMIN WITH MINERALS) tablet Take 1 tablet by mouth daily. CENTRUM    [provider]  pravastatin (PRAVACHOL) 10 MG  tablet Take 1 tablet (10 mg total) by mouth daily. Patient taking differently: Take 10 mg by mouth every evening.  01/03/16   Konrad Felix, PA  sildenafil (VIAGRA) 25 MG tablet Take 25 mg by mouth daily as needed for erectile dysfunction.    [provider]  tamsulosin (FLOMAX) 0.4 MG CAPS capsule Take 0.4 mg by mouth daily. 05/19/19   [provider]  tiZANidine (ZANAFLEX) 4 MG tablet Take 1 tablet (4 mg total) by mouth every 6 (six) hours as needed for muscle spasms. 11/28/19   Ok Edwards, PA-C    Family  History Family History  Problem Relation Age of Onset  . Vaginal cancer Mother   . Bladder Cancer Mother   . Rectal cancer Mother   . Lung cancer Father   . Breast cancer Sister   . Colon cancer Sister   . Prostate cancer Neg Hx     Social History Social History   Tobacco Use  . Smoking status: Never Smoker  . Smokeless tobacco: Never Used  Substance Use Topics  . Alcohol use: Yes    Comment: occ  . Drug use: No     Allergies   Patient has no known allergies.   Review of Systems Review of Systems  Reason unable to perform ROS: See HPI as above.     Physical Exam Triage Vital Signs ED Triage Vitals  Enc Vitals Group     BP      Pulse      Resp      Temp      Temp src      SpO2      Weight      Height      Head Circumference      Peak Flow      Pain Score      Pain Loc      Pain Edu?      Excl. in [place]?    No data found.  Updated Vital Signs BP (!) 145/86 (BP Location: Left Arm)   Pulse 75   Temp 97.7 F (36.5 C) (Oral)   Resp 18   SpO2 98%   Physical Exam Constitutional:      General: He is not in acute distress.    Appearance: He is well-developed. He is not diaphoretic.  HENT:     Head: Normocephalic and atraumatic.  Eyes:     Conjunctiva/sclera: Conjunctivae normal.     Pupils: Pupils are equal, round, and reactive to light.  Neck:     Musculoskeletal: Normal range of motion and neck supple. Pain with movement present. No spinous process tenderness or muscular tenderness.  Cardiovascular:     Rate and Rhythm: Normal rate and regular rhythm.     Heart sounds: Normal heart sounds. No murmur. No friction rub. No gallop.   Pulmonary:     Effort: Pulmonary effort is normal. No accessory muscle usage or respiratory distress.     Breath sounds: Normal breath sounds. No stridor. No decreased breath sounds, wheezing, rhonchi or rales.  Musculoskeletal:     Comments: No tenderness on palpation of the spinous process. No tenderness to palpation  of thoracic back, bilateral shoulders, upper arms, elbow. Full range of motion of shoulder, elbow. Strength normal and equal bilaterally. Normal grip strength. Sensation intact and equal bilaterally. Radial pulses 2+ and equal bilaterally. Capillary refill less than 2 seconds.   Skin:    General: Skin is warm and dry.  Neurological:  Mental Status: He is alert and oriented to person, place, and time.      UC Treatments / Results  Labs (all labs ordered are listed, but only abnormal results are displayed) Labs Reviewed - No data to display  EKG   Radiology No results found.  Procedures Procedures (including critical care time)  Medications Ordered in UC Medications - No data to display  Initial Impression / Assessment and Plan / UC Course  I have reviewed the triage vital signs and the nursing notes.  Pertinent labs & imaging results that were available during my care of the patient were reviewed by me and considered in my medical decision making (see chart for details).    No alarming signs on exam.  Will start Mobic, tizanidine as directed.  Patient to continue to monitor symptoms.  Return precautions given.  Patient expresses understanding and agrees to plan.  Final Clinical Impressions(s) / UC Diagnoses   Final diagnoses:  Strain of neck muscle, initial encounter   ED Prescriptions    Medication Sig Dispense Auth. Provider   meloxicam (MOBIC) 7.5 MG tablet Take 1 tablet (7.5 mg total) by mouth daily. 15 tablet Amiya Escamilla V, PA-C   tiZANidine (ZANAFLEX) 4 MG tablet Take 1 tablet (4 mg total) by mouth every 6 (six) hours as needed for muscle spasms. 15 tablet Ok Edwards, PA-C     PDMP not reviewed this encounter.   Ok Edwards, PA-C 11/28/19 [phone]

## 2019-11-28 NOTE — ED Triage Notes (Signed)
Pt c/o upper back/neck/shoulder pain since last Wednesday, denies injury

## 2020-02-10 ENCOUNTER — Encounter: Payer: Self-pay | Admitting: Gastroenterology

## 2020-02-23 ENCOUNTER — Telehealth (HOSPITAL_COMMUNITY): Payer: Self-pay

## 2020-02-23 NOTE — Telephone Encounter (Signed)

## 2020-02-27 ENCOUNTER — Other Ambulatory Visit: Payer: Self-pay

## 2020-02-27 ENCOUNTER — Ambulatory Visit (HOSPITAL_COMMUNITY)
Admission: RE | Admit: 2020-02-27 | Discharge: 2020-02-27 | Disposition: A | Discharge status: Home / Self Care | Payer: 59 | Source: Ambulatory Visit | Attending: Surgery | Admitting: Surgery

## 2020-02-27 DIAGNOSIS — R609 Edema, unspecified: Secondary | ICD-10-CM

## 2020-02-28 ENCOUNTER — Telehealth (HOSPITAL_COMMUNITY): Payer: Self-pay

## 2020-02-28 NOTE — Telephone Encounter (Signed)

## 2020-02-29 ENCOUNTER — Other Ambulatory Visit: Payer: Self-pay

## 2020-02-29 ENCOUNTER — Ambulatory Visit (INDEPENDENT_AMBULATORY_CARE_PROVIDER_SITE_OTHER): Payer: 59 | Admitting: Vascular Surgery

## 2020-02-29 ENCOUNTER — Encounter: Payer: Self-pay | Admitting: Vascular Surgery

## 2020-02-29 VITALS — BP 137/82 | HR 70 | Temp 97.6°F | Resp 18 | Ht 71.5 in | Wt 296.6 lb

## 2020-02-29 DIAGNOSIS — I872 Venous insufficiency (chronic) (peripheral): Secondary | ICD-10-CM | POA: Diagnosis not present

## 2020-02-29 DIAGNOSIS — I8393 Asymptomatic varicose veins of bilateral lower extremities: Secondary | ICD-10-CM

## 2020-02-29 NOTE — Progress Notes (Signed)
Referring Physician: Dr. [name]   Patient name: Andrew [name] MRN: [id number] DOB: 1956/12/08 Sex: male  REASON FOR CONSULT: Bilateral leg swelling  HPI: Andrew [name] is a 64 y.o. male, with a 1 year history of bilateral lower extremity swelling.  The patient has also had some skin changes in both legs with brownish discoloration.  He occasionally has some leaking of fluid from his legs but has never had a frank ulcer breakdown.  He has no prior history of DVT.  He has no family history of varicose veins.  He has never worn compression stockings.  He has gained about 15 pounds over the last year.  He develops heaviness fullness and aching in both of his lower extremities.  His job requires him to stand on his feet all day long.  He states that the swelling does not really improve overnight.  He has not really tried leg elevation in the past.  He denies any lower extremity trauma in the past.  He did get treated for prostate cancer with implanted seeds but never had any pelvic radiation.  Other medical problems include diabetes hyperlipidemia hypertension all of which are currently stable.  Past Medical History:  Diagnosis Date  . Chronic venous insufficiency    right leg, swells a small bit has some discoloration seeing primary care md for has not been officiall dx yet  . Diabetes mellitus without complication ([place])   . Hyperlipemia   . Hypertension   . Pre-diabetes    on januvia for  . Prostate cancer [facility])    Past Surgical History:  Procedure Laterality Date  . CIRCUMCISION    . COLONOSCOPY    . CYSTOSCOPY N/A 09/12/2019   Procedure: CYSTOSCOPY;  Surgeon: Lucas Mallow, MD;  [facility];  Service: Urology;  Laterality: N/A;  No seeds detected in bladder per Dr. Gloriann Loan  . PROSTATE BIOPSY  06/23/2019  . RADIOACTIVE SEED IMPLANT N/A 09/12/2019   Procedure: RADIOACTIVE SEED IMPLANT/BRACHYTHERAPY IMPLANT;  Surgeon: Lucas Mallow, MD;   Location: [facility];  Service: Urology;  Laterality: N/A;  . SPACE OAR INSTILLATION N/A 09/12/2019   Procedure: SPACE OAR INSTILLATION;  Surgeon: Lucas Mallow, MD;  Location: [facility];  Service: Urology;  Laterality: N/A;    Family History  Problem Relation Age of Onset  . Vaginal cancer Mother   . Bladder Cancer Mother   . Rectal cancer Mother   . Lung cancer Father   . Breast cancer Sister   . Colon cancer Sister   . Prostate cancer Neg Hx     SOCIAL HISTORY: Social History   Socioeconomic History  . Marital status: Divorced    Spouse name: Not on file  . Number of children: 0  . Years of education: Not on file  . Highest education level: Not on file  Occupational History    Comment: full time   Tobacco Use  . Smoking status: Never Smoker  . Smokeless tobacco: Never Used  Substance and Sexual Activity  . Alcohol use: Yes    Comment: occ  . Drug use: No  . Sexual activity: Yes    Comment: with aid of sildenafil  Other Topics Concern  . Not on file  Social History Narrative  . Not on file   Social Determinants of Health   Financial Resource Strain:   . Difficulty of Paying Living Expenses: Not on file  Food Insecurity:   .  Worried About Charity fundraiser in the Last Year: Not on file  . Ran Out of Food in the Last Year: Not on file  Transportation Needs:   . Lack of Transportation (Medical): Not on file  . Lack of Transportation (Non-Medical): Not on file  Physical Activity:   . Days of Exercise per Week: Not on file  . Minutes of Exercise per Session: Not on file  Stress:   . Feeling of Stress : Not on file  Social Connections:   . Frequency of Communication with Friends and Family: Not on file  . Frequency of Social Gatherings with Friends and Family: Not on file  . Attends Religious Services: Not on file  . Active Member of Clubs or Organizations: Not on file  . Attends Archivist Meetings: Not on file   . Marital Status: Not on file  Intimate Partner Violence:   . Fear of Current or Ex-Partner: Not on file  . Emotionally Abused: Not on file  . Physically Abused: Not on file  . Sexually Abused: Not on file    No Known Allergies  Current Outpatient Medications  Medication Sig Dispense Refill  . amLODipine (NORVASC) 10 MG tablet Taking Monday wed and friday    . aspirin 81 MG tablet Take 81 mg by mouth daily.      . Azilsartan-Chlorthalidone 40-25 MG TABS Take by mouth daily.    . finasteride (PROSCAR) 5 MG tablet Take 5 mg by mouth daily.    . furosemide (LASIX) 20 MG tablet Take 1 tablet (20 mg total) by mouth daily. 90 tablet 1  . JANUVIA 50 MG tablet Take 50 mg by mouth daily.    . Multiple Vitamins-Minerals (MULTIVITAMIN WITH MINERALS) tablet Take 1 tablet by mouth daily. CENTRUM    . pravastatin (PRAVACHOL) 10 MG tablet Take 1 tablet (10 mg total) by mouth daily. (Patient taking differently: Take 10 mg by mouth every evening. ) 90 tablet 1  . sildenafil (VIAGRA) 25 MG tablet Take 25 mg by mouth daily as needed for erectile dysfunction.    . tamsulosin (FLOMAX) 0.4 MG CAPS capsule Take 0.4 mg by mouth daily.    Marland Kitchen HYDROcodone-acetaminophen (NORCO) 5-325 MG tablet Take 1 tablet by mouth every 4 (four) hours as needed for moderate pain. (Patient not taking: Reported on 10/06/2019) 8 tablet 0  . meloxicam (MOBIC) 7.5 MG tablet Take 1 tablet (7.5 mg total) by mouth daily. (Patient not taking: Reported on 02/29/2020) 15 tablet 0  . tiZANidine (ZANAFLEX) 4 MG tablet Take 1 tablet (4 mg total) by mouth every 6 (six) hours as needed for muscle spasms. (Patient not taking: Reported on 02/29/2020) 15 tablet 0   No current facility-administered medications for this visit.    ROS:   General:  No weight loss, Fever, chills  HEENT: No recent headaches, no nasal bleeding, no visual changes, no sore throat  Neurologic: No dizziness, blackouts, seizures. No recent symptoms of stroke or mini-  stroke. No recent episodes of slurred speech, or temporary blindness.  Cardiac: No recent episodes of chest pain/pressure, no shortness of breath at rest.  No shortness of breath with exertion.  Denies history of atrial fibrillation or irregular heartbeat  Vascular: No history of rest pain in feet.  No history of claudication.  No history of non-healing ulcer, No history of DVT   Pulmonary: No home oxygen, no productive cough, no hemoptysis,  No asthma or wheezing  Musculoskeletal:  [ ]  Arthritis, [ ]   Low back pain,  [ ]  Joint pain  Hematologic:No history of hypercoagulable state.  No history of easy bleeding.  No history of anemia  Gastrointestinal: No hematochezia or melena,  No gastroesophageal reflux, no trouble swallowing  Urinary: [ ]  chronic Kidney disease, [ ]  on HD - [ ]  MWF or [ ]  TTHS, [ ]  Burning with urination, [ ]  Frequent urination, [ ]  Difficulty urinating;   Skin: No rashes  Psychological: No history of anxiety,  No history of depression   Physical Examination  Vitals:   02/29/20 0939  BP: 137/82  Pulse: 70  Resp: 18  Temp: 97.6 F (36.4 C)  TempSrc: Temporal  SpO2: 99%  Weight: 296 lb 9.6 oz (134.5 kg)  Height: 5' 11.5" (1.816 m)    Body mass index is 40.79 kg/m.  General:  Alert and oriented, no acute distress HEENT: Normal Neck: No JVD Cardiac: Regular Rate and Rhythm  Abdomen: Soft, non-tender, non-distended, no mass, obese Skin: No rash, hemosiderin staining and brawny discoloration with mild erythema gaiter area bilaterally Extremity Pulses:  2+ radial, brachial, femoral, dorsalis pedis pulses bilaterally Musculoskeletal: No deformity diffuse lower extremity extending from the knee down into the foot edema this is fairly symmetric bilaterally.  There is slight pitting.  Neurologic: Upper and lower extremity motor 5/5 and symmetric  DATA:  Patient had a venous reflux exam performed on February 27, 2020.  Right greater saphenous was 5 mm in diameter  but no reflux noted.  He did have reflux in the right common femoral vein.  In the left lower extremity, he had diffuse reflux in the left greater and lesser saphenous vein.  Vein diameter was 4 to 6 mm.  ASSESSMENT: Lower extremity edema.  Definitely has a component of reflux in the left greater saphenous vein as well as the deep system.  On his initial duplex ultrasound no reflux was noted in the right leg but certainly he has symptoms suggestive of superficial and deep venous reflux on the right as well.   PLAN: Patient was counseled today in weight loss as well as elevation of his legs.  He was also given a prescription for thigh-high 20 to 30 mm compression stockings to give him symptomatic relief.  Patient will follow up with me in 3 months time to see if he has had improvement of his symptoms.  At that time if his symptoms or not improved we will consider laser ablation of his left greater saphenous vein.  I suspect at some point in the future we should probably repeat his right leg ultrasound as well as I am highly suspicious that even though his initial ultrasound did not show it he probably has reflux on the right as well.   Ruta Hinds, MD [facility] Office: [phone] Pager: [phone]

## 2020-03-12 ENCOUNTER — Ambulatory Visit (AMBULATORY_SURGERY_CENTER): Payer: Self-pay | Admitting: *Deleted

## 2020-03-12 ENCOUNTER — Other Ambulatory Visit: Payer: Self-pay

## 2020-03-12 VITALS — Temp 98.0°F | Ht 71.0 in | Wt 288.0 lb

## 2020-03-12 DIAGNOSIS — Z800 Family history of malignant neoplasm of digestive organs: Secondary | ICD-10-CM

## 2020-03-12 DIAGNOSIS — Z01818 Encounter for other preprocedural examination: Secondary | ICD-10-CM

## 2020-03-12 MED ORDER — SUPREP BOWEL PREP KIT 17.5-3.13-1.6 GM/177ML PO SOLN: 1.0000 | Freq: Once | ORAL | 0 refills | Status: AC

## 2020-03-12 NOTE — Progress Notes (Signed)
No egg or soy allergy known to patient  No issues with past sedation with any surgeries  or procedures, no intubation problems  No diet pills per patient No home 02 use per patient  No blood thinners per patient  Pt denies issues with constipation  No A fib or A flutter  EMMI video sent to pt's e mail   Due to the COVID-19 pandemic we are asking patients to follow these guidelines. Please only bring one care partner. Please be aware that your care partner may wait in the car in the parking lot or if they feel like they will be too hot to wait in the car, they may wait in the lobby on the 4th floor. All care partners are required to wear a mask the entire time (we do not have any that we can provide them), they need to practice social distancing, and we will do a Covid check for all patient's and care partners when you arrive. Also we will check their temperature and your temperature. If the care partner waits in their car they need to stay in the parking lot the entire time and we will call them on their cell phone when the patient is ready for discharge so they can bring the car to the front of the building. Also all patient's will need to wear a mask into building. Suprep code to [place] and coupon to pt.

## 2020-03-22 ENCOUNTER — Other Ambulatory Visit: Payer: Self-pay | Admitting: Gastroenterology

## 2020-03-22 ENCOUNTER — Ambulatory Visit (INDEPENDENT_AMBULATORY_CARE_PROVIDER_SITE_OTHER): Payer: 59

## 2020-03-22 DIAGNOSIS — Z1159 Encounter for screening for other viral diseases: Secondary | ICD-10-CM

## 2020-03-22 LAB — SARS CORONAVIRUS 2 (TAT 6-24 HRS): SARS Coronavirus 2: NEGATIVE

## 2020-03-26 ENCOUNTER — Ambulatory Visit (AMBULATORY_SURGERY_CENTER): Payer: 59 | Admitting: Gastroenterology

## 2020-03-26 ENCOUNTER — Encounter: Payer: Self-pay | Admitting: Gastroenterology

## 2020-03-26 ENCOUNTER — Other Ambulatory Visit: Payer: Self-pay

## 2020-03-26 VITALS — BP 123/81 | HR 68 | Temp 96.9°F | Resp 16 | Ht 71.0 in | Wt 288.0 lb

## 2020-03-26 DIAGNOSIS — D123 Benign neoplasm of transverse colon: Secondary | ICD-10-CM

## 2020-03-26 DIAGNOSIS — Z1211 Encounter for screening for malignant neoplasm of colon: Secondary | ICD-10-CM | POA: Diagnosis not present

## 2020-03-26 DIAGNOSIS — D122 Benign neoplasm of ascending colon: Secondary | ICD-10-CM

## 2020-03-26 DIAGNOSIS — Z800 Family history of malignant neoplasm of digestive organs: Secondary | ICD-10-CM | POA: Diagnosis present

## 2020-03-26 MED ORDER — SODIUM CHLORIDE 0.9 % IV SOLN: 500.0000 mL | Freq: Once | INTRAVENOUS | Status: DC

## 2020-03-26 NOTE — Op Note (Signed)
[facility] Patient Name: Andrew [name] Procedure Date: 03/26/2020 8:51 AM MRN: [id number] Endoscopist: Milus Banister , MD Age: 64 Referring MD:  Date of Birth: [birth year] Gender: Male Account #: [id number] Procedure:                Colonoscopy Indications:              Screening patient at increased risk: Family history                            of colorectal cancer in multiple 1st-degree                            relatives (mother and sister) Medicines:                Monitored Anesthesia Care Procedure:                Pre-Anesthesia Assessment:                           - Prior to the procedure, a History and Physical                            was performed, and patient medications and                            allergies were reviewed. The patient's tolerance of                            previous anesthesia was also reviewed. The risks                            and benefits of the procedure and the sedation                            options and risks were discussed with the patient.                            All questions were answered, and informed consent                            was obtained. Prior Anticoagulants: The patient has                            taken no previous anticoagulant or antiplatelet                            agents. ASA Grade Assessment: II - A patient with                            mild systemic disease. After reviewing the risks                            and benefits, the patient was deemed in  satisfactory condition to undergo the procedure.                           After obtaining informed consent, the colonoscope                            was passed under direct vision. Throughout the                            procedure, the patient's blood pressure, pulse, and                            oxygen saturations were monitored continuously. The                            Colonoscope was introduced through  the anus and                            advanced to the the cecum, identified by                            appendiceal orifice and ileocecal valve. The                            colonoscopy was performed without difficulty. The                            patient tolerated the procedure well. The quality                            of the bowel preparation was good. The ileocecal                            valve, appendiceal orifice, and rectum were                            photographed. Scope In: 9:02:50 AM Scope Out: 9:20:09 AM Scope Withdrawal Time: 0 hours 14 minutes 49 seconds  Total Procedure Duration: 0 hours 17 minutes 19 seconds  Findings:                 Four sessile polyps were found in the transverse                            colon and ascending colon. The polyps were 2 to 3                            mm in size. These polyps were removed with a cold                            snare. Resection and retrieval were complete.                           A few small-mouthed diverticula were found in the  left colon.                           External and internal hemorrhoids were found. The                            hemorrhoids were small.                           The exam was otherwise without abnormality on                            direct and retroflexion views. Complications:            No immediate complications. Estimated blood loss:                            None. Estimated Blood Loss:     Estimated blood loss: none. Impression:               - Four 2 to 3 mm polyps in the transverse colon and                            in the ascending colon, removed with a cold snare.                            Resected and retrieved.                           - Diverticulosis in the left colon.                           - External and internal hemorrhoids.                           - The examination was otherwise normal on direct                             and retroflexion views. Recommendation:           - Patient has a contact number available for                            emergencies. The signs and symptoms of potential                            delayed complications were discussed with the                            patient. Return to normal activities tomorrow.                            Written discharge instructions were provided to the                            patient.                           -  Resume previous diet.                           - Continue present medications.                           - Await pathology results. Milus Banister, MD 03/26/2020 9:23:01 AM This report has been signed electronically.

## 2020-03-26 NOTE — Progress Notes (Signed)
Called to room to assist during endoscopic procedure.  Patient ID and intended procedure confirmed with present staff. Received instructions for my participation in the procedure from the performing physician.  

## 2020-03-26 NOTE — Progress Notes (Signed)
Temp check by:JB Vital check by:DT  The patient states no changes in medical or surgical history since pre-visit screening on 03/12/2020.

## 2020-03-26 NOTE — Patient Instructions (Signed)
Handouts given:  Hemorrhoids, Diverticulosis, Polyps REsume rpevious diet Continue current medications Await pathology results Schedule first available appointment in office to discuss hemorrhoids  YOU HAD AN ENDOSCOPIC PROCEDURE TODAY AT [facility]:   Refer to the procedure report that was given to you for any specific questions about what was found during the examination.  If the procedure report does not answer your questions, please call your gastroenterologist to clarify.  If you requested that your care partner not be given the details of your procedure findings, then the procedure report has been included in a sealed envelope for you to review at your convenience later.  YOU SHOULD EXPECT: Some feelings of bloating in the abdomen. Passage of more gas than usual.  Walking can help get rid of the air that was put into your GI tract during the procedure and reduce the bloating. If you had a lower endoscopy (such as a colonoscopy or flexible sigmoidoscopy) you may notice spotting of blood in your stool or on the toilet paper. If you underwent a bowel prep for your procedure, you may not have a normal bowel movement for a few days.  Please Note:  You might notice some irritation and congestion in your nose or some drainage.  This is from the oxygen used during your procedure.  There is no need for concern and it should clear up in a day or so.  SYMPTOMS TO REPORT IMMEDIATELY:   Following lower endoscopy (colonoscopy or flexible sigmoidoscopy):  Excessive amounts of blood in the stool  Significant tenderness or worsening of abdominal pains  Swelling of the abdomen that is new, acute  Fever of 100F or higher   For urgent or emergent issues, a gastroenterologist can be reached at any hour by calling [phone]. Do not use MyChart messaging for urgent concerns.    DIET:  We do recommend a small meal at first, but then you may proceed to your regular diet.  Drink plenty  of fluids but you should avoid alcoholic beverages for 24 hours.  ACTIVITY:  You should plan to take it easy for the rest of today and you should NOT DRIVE or use heavy machinery until tomorrow (because of the sedation medicines used during the test).    FOLLOW UP: Our staff will call the number listed on your records 48-72 hours following your procedure to check on you and address any questions or concerns that you may have regarding the information given to you following your procedure. If we do not reach you, we will leave a message.  We will attempt to reach you two times.  During this call, we will ask if you have developed any symptoms of COVID 19. If you develop any symptoms (ie: fever, flu-like symptoms, shortness of breath, cough etc.) before then, please call [phone].  If you test positive for Covid 19 in the 2 weeks post procedure, please call and report this information to [place].    If any biopsies were taken you will be contacted by phone or by letter within the next 1-3 weeks.  Please call us at [phone] if you have not heard about the biopsies in 3 weeks.    SIGNATURES/CONFIDENTIALITY: You and/or your care partner have signed paperwork which will be entered into your electronic medical record.  These signatures attest to the fact that that the information above on your After Visit Summary has been reviewed and is understood.  Full responsibility of the confidentiality of this discharge  information lies with you and/or your care-partner. 

## 2020-03-26 NOTE — Progress Notes (Signed)
PT taken to PACU. Monitors in place. VSS. Report given to RN. 

## 2020-03-28 ENCOUNTER — Telehealth: Payer: Self-pay | Admitting: *Deleted

## 2020-03-28 NOTE — Telephone Encounter (Signed)
  Follow up Call-  Call back number 03/26/2020  Post procedure Call Back phone  # [phone]  Permission to leave phone message Yes  Some recent data might be hidden     Patient questions:  Do you have a fever, pain , or abdominal swelling? No. Pain Score  0 *  Have you tolerated food without any problems? Yes.    Have you been able to return to your normal activities? Yes.    Do you have any questions about your discharge instructions: Diet   No. Medications  No. Follow up visit  No.  Do you have questions or concerns about your Care? No.  Actions: * If pain score is 4 or above: No action needed, pain <4.  1. Have you developed a fever since your procedure? no  2.   Have you had an respiratory symptoms (SOB or cough) since your procedure? no  3.   Have you tested positive for COVID 19 since your procedure no  4.   Have you had any family members/close contacts diagnosed with the COVID 19 since your procedure?  no   If yes to any of these questions please route to Joylene John, RN and Erenest Rasher, RN

## 2020-03-29 ENCOUNTER — Encounter: Payer: Self-pay | Admitting: Gastroenterology

## 2020-06-06 ENCOUNTER — Ambulatory Visit: Payer: 59 | Admitting: Vascular Surgery

## 2020-06-27 ENCOUNTER — Ambulatory Visit: Payer: 59 | Admitting: Vascular Surgery

## 2020-08-22 ENCOUNTER — Ambulatory Visit (INDEPENDENT_AMBULATORY_CARE_PROVIDER_SITE_OTHER): Payer: 59 | Admitting: Vascular Surgery

## 2020-08-22 ENCOUNTER — Other Ambulatory Visit: Payer: Self-pay

## 2020-08-22 ENCOUNTER — Encounter: Payer: Self-pay | Admitting: Vascular Surgery

## 2020-08-22 VITALS — BP 130/80 | HR 68 | Temp 97.7°F | Resp 18 | Ht 72.0 in | Wt 286.0 lb

## 2020-08-22 DIAGNOSIS — I83812 Varicose veins of left lower extremities with pain: Secondary | ICD-10-CM

## 2020-08-22 DIAGNOSIS — M7989 Other specified soft tissue disorders: Secondary | ICD-10-CM

## 2020-08-22 NOTE — Progress Notes (Signed)
Patient is a 64 year old male who returns for follow-up today.  He was last seen March 2021.  At that time he was having difficulty with bilateral leg swelling.  He has also had brawny discoloration of the skin that is occurred in the leg area on both sides.  He states he has had some improvement in his skin texture with wearing compression stockings since his last office visit.  He has previously had fluid leaking from his legs but never had a frank ulcer.  He is trying to lose weight as well.  He states his legs do feel a little bit better with elevation and stockings but certainly the swelling symptoms have not completely dissipated.  He performs a job that requires him to stand all day long.  He develops heaviness fullness and aching in both lower extremities after being at work all day.  He has no prior history of DVT.  Review of systems: He has no shortness of breath.  He has no chest pain.  Past Medical History:  Diagnosis Date  . Allergy   . Chronic venous insufficiency    right leg, swells a small bit has some discoloration seeing primary care md for has not been officiall dx yet  . Diabetes mellitus without complication ([place])   . Hyperlipemia   . Hypertension   . Pre-diabetes    on januvia for  . Prostate cancer [facility])     Past Surgical History:  Procedure Laterality Date  . CIRCUMCISION    . COLONOSCOPY    . CYSTOSCOPY N/A 09/12/2019   Procedure: CYSTOSCOPY;  Surgeon: Lucas Mallow, MD;  Location: [facility];  Service: Urology;  Laterality: N/A;  No seeds detected in bladder per Dr. Gloriann Loan  . PROSTATE BIOPSY  06/23/2019  . RADIOACTIVE SEED IMPLANT N/A 09/12/2019   Procedure: RADIOACTIVE SEED IMPLANT/BRACHYTHERAPY IMPLANT;  Surgeon: Lucas Mallow, MD;  Location: [facility];  Service: Urology;  Laterality: N/A;  . SPACE OAR INSTILLATION N/A 09/12/2019   Procedure: SPACE OAR INSTILLATION;  Surgeon: Lucas Mallow, MD;  Location: [facility];  Service: Urology;  Laterality: N/A;    Current Outpatient Medications on File Prior to Visit  Medication Sig Dispense Refill  . amLODipine (NORVASC) 10 MG tablet Taking Monday wed and friday    . aspirin 81 MG tablet Take 81 mg by mouth daily.      . Azilsartan-Chlorthalidone 40-25 MG TABS Take by mouth daily.    . finasteride (PROSCAR) 5 MG tablet Take 5 mg by mouth daily.    . furosemide (LASIX) 20 MG tablet Take 1 tablet (20 mg total) by mouth daily. 90 tablet 1  . JANUVIA 50 MG tablet Take 50 mg by mouth daily.    . Multiple Vitamins-Minerals (MULTIVITAMIN WITH MINERALS) tablet Take 1 tablet by mouth daily. CENTRUM    . pravastatin (PRAVACHOL) 10 MG tablet Take 1 tablet (10 mg total) by mouth daily. (Patient taking differently: Take 10 mg by mouth every evening. ) 90 tablet 1  . sildenafil (VIAGRA) 25 MG tablet Take 25 mg by mouth daily as needed for erectile dysfunction.    . tamsulosin (FLOMAX) 0.4 MG CAPS capsule Take 0.4 mg by mouth daily.     No current facility-administered medications on file prior to visit.    Physical exam:  Vitals:   08/22/20 1259  BP: 130/80  Pulse: 68  Resp: 18  Temp: 97.7 F (36.5 C)  TempSrc:  Temporal  SpO2: 98%  Weight: 286 lb (129.7 kg)  Height: 6' (1.829 m)    Brawny discoloration right worse than left leg bilaterally, 2+ dorsalis pedis pulse No skin ulcer  Data: I reviewed the patient's prior duplex for reflux which was performed on February 27, 2020.  Right greater saphenous was 5 mm diameter but no reflux.  Left was 4-6.  I confirmed the findings of the left leg today with the SonoSite at the bedside.  Assessment: Patient has not completely alleviated his symptoms with conservative management with compression stockings alone.  He still has leg swelling in both legs.  We were unable to document reflux on the right leg on the previous exam.  It might be worth revisiting that at some point in the future.  He does have reflux in  the left leg and I believe he would benefit from laser ablation of the left greater saphenous vein for improvement of his symptoms.  We did discuss that weight loss would also continue to be part of this plan.  Risk benefits possible complications and procedure details of laser ablation were discussed with patient today including not limited to bleeding infection DVT.  He understands and agrees to proceed.  Plan: We will schedule patient for left greater saphenous laser ablation pending insurance approval.  After he has had his left leg laser procedure done we may consider repeating his right leg reflux exam since he does have significant skin changes on this leg as well.  Ruta Hinds, MD [facility] Office: [phone]

## 2020-08-24 IMAGING — US RIGHT LOWER EXTREMITY SOFT TISSUE ULTRASOUND LIMITED
1 series · 14 of 25 positions shown · non-contrast
Comparison: None.

CLINICAL DATA: Lower extremity swelling

EXAM:
ULTRASOUND RIGHT LOWER EXTREMITY LIMITED
TECHNIQUE: Ultrasound examination of the lower extremity soft tissues was
performed in the area of clinical concern.

[Series 1: right lower extremity soft tissue ultrasound limit · 14 of 35 slices shown]
[im 1/35]
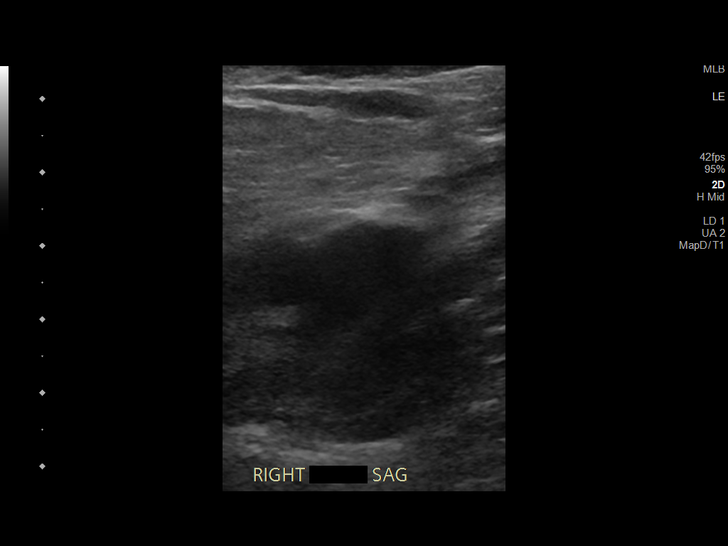
[im 3/35]
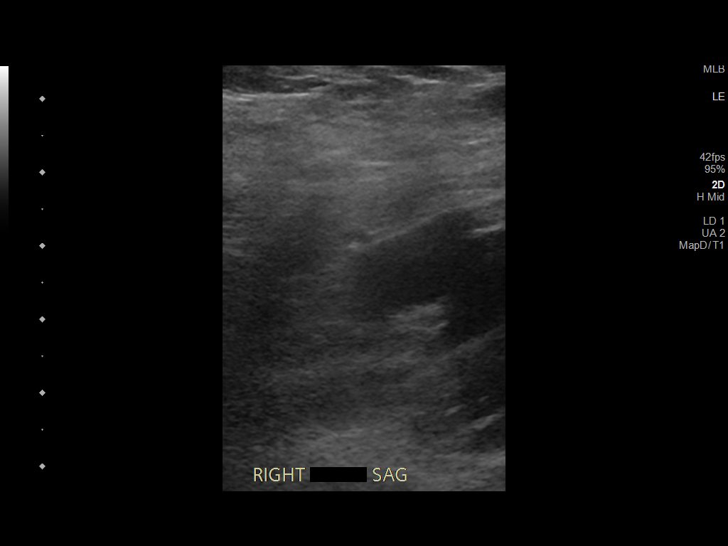
[im 6/35]
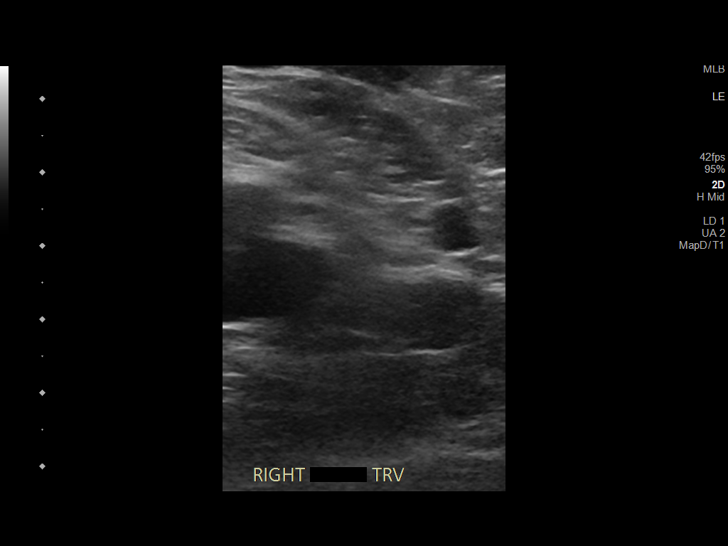
[im 9/35]
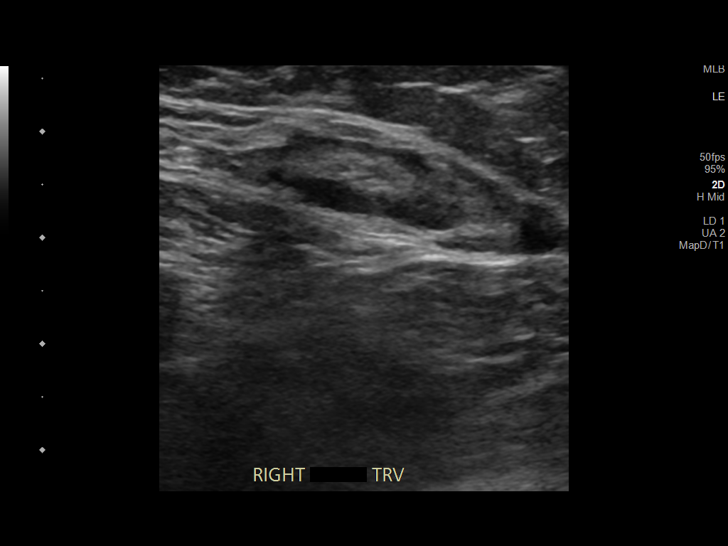
[im 12/35]
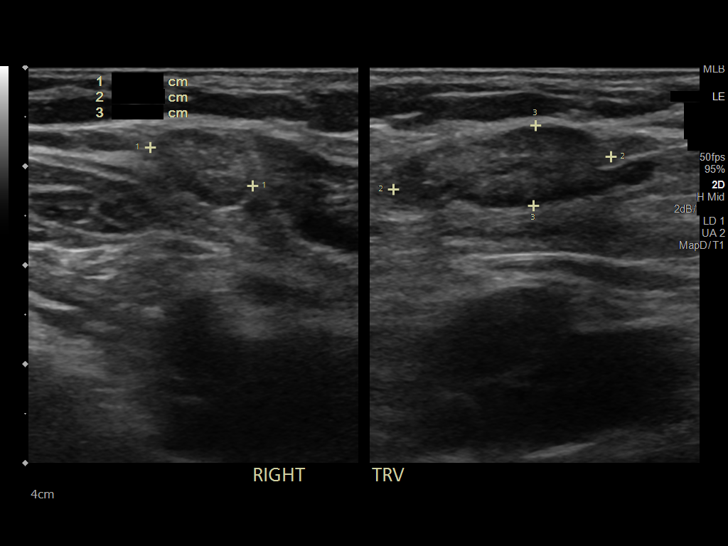
[im 13/35]
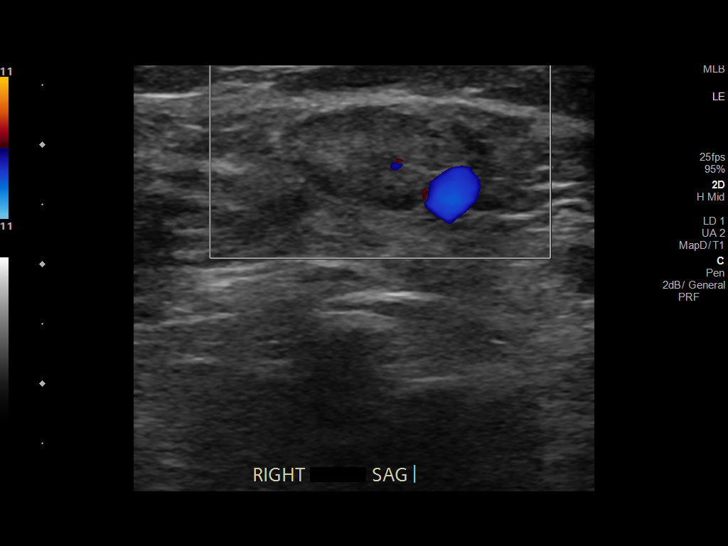
[im 16/35]
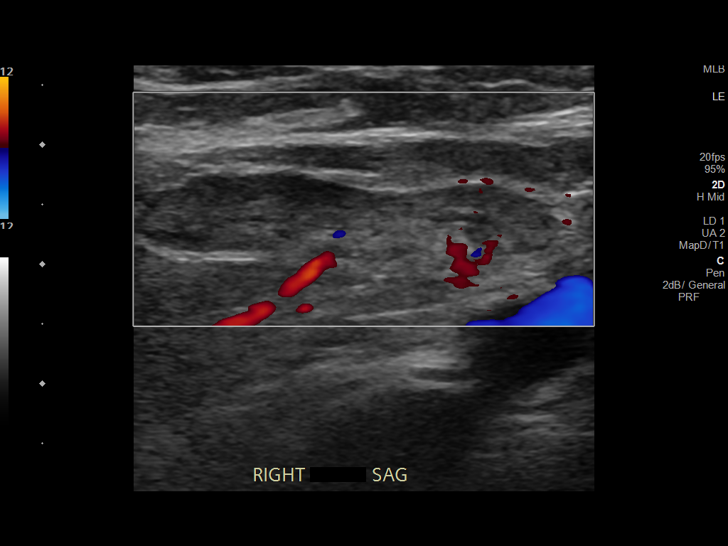
[im 19/35]
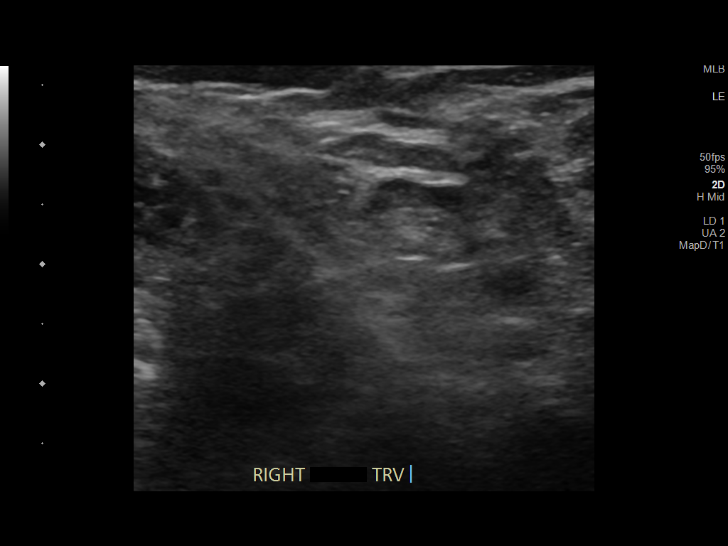
[im 22/35]
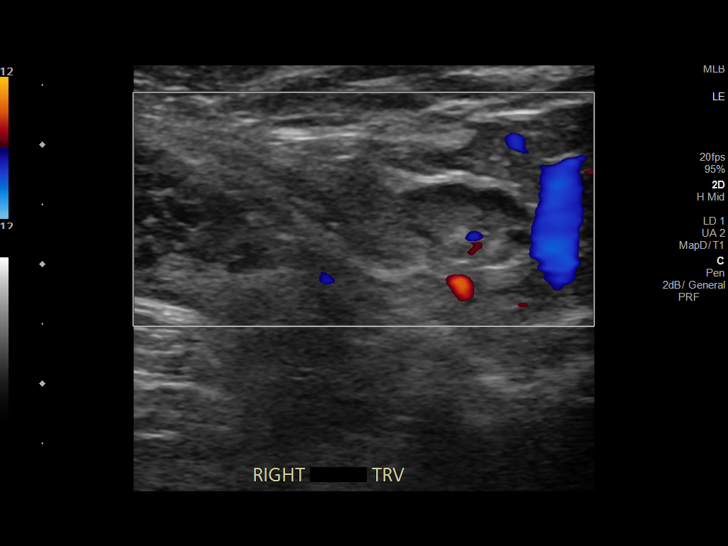
[im 23/35]
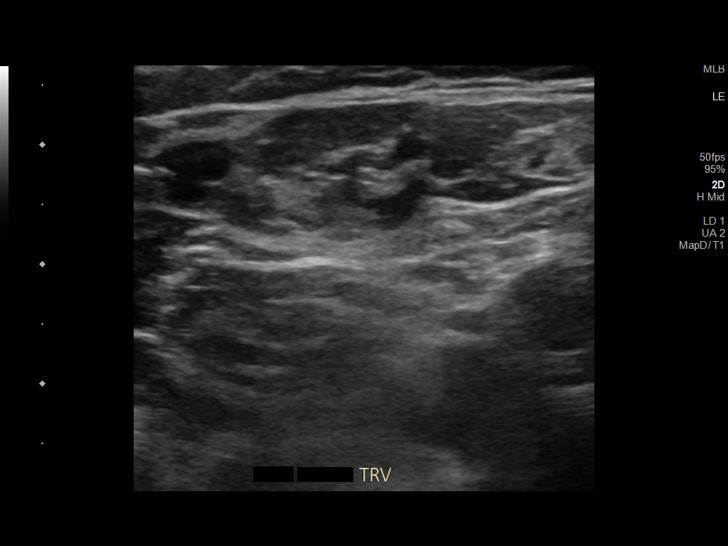
[im 26/35]
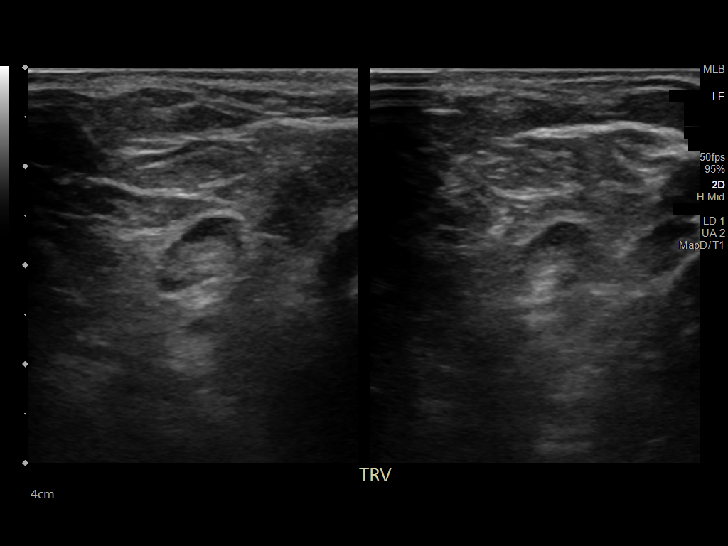
[im 29/35]
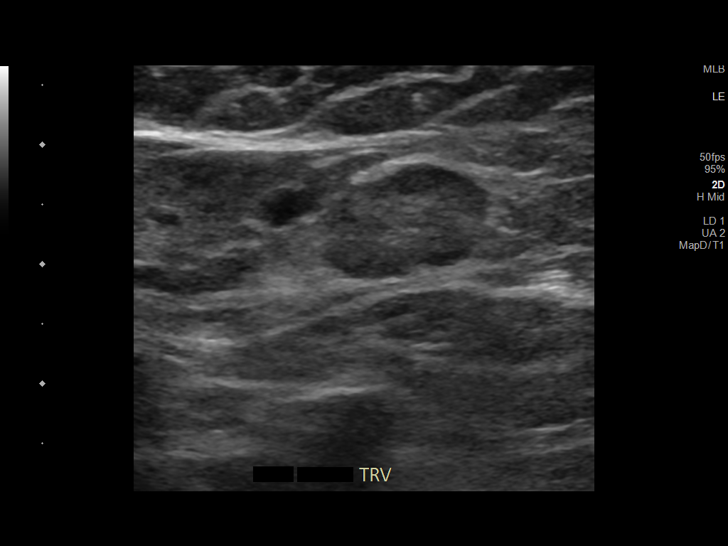
[im 32/35]
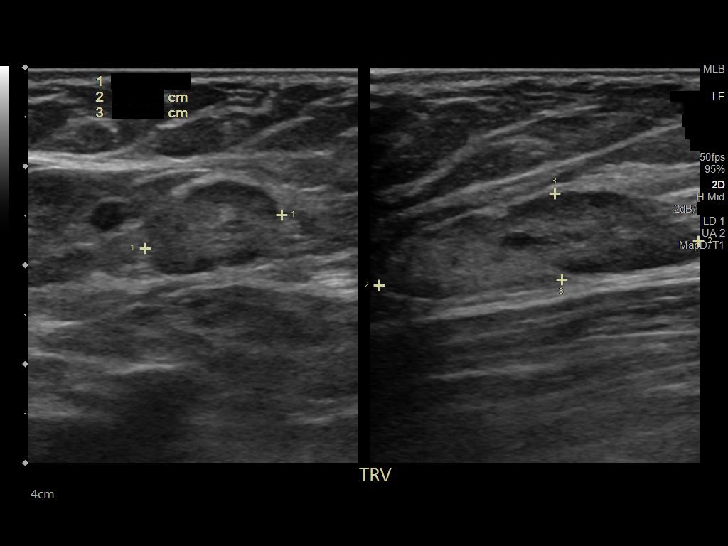
[im 35/35]
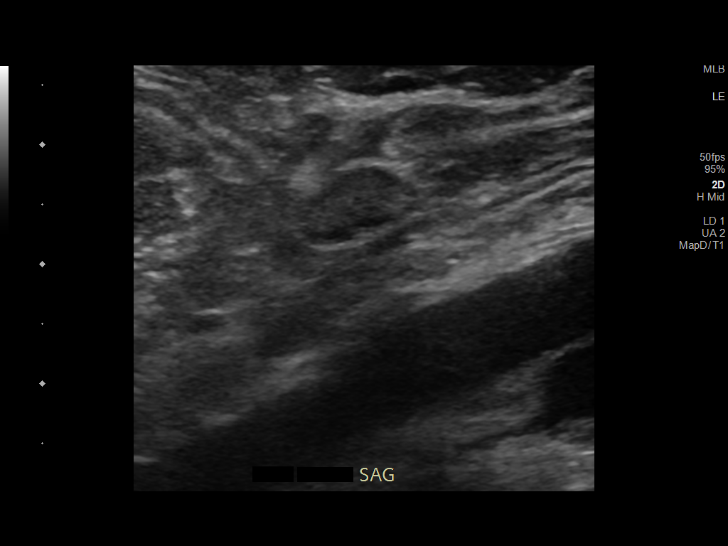

[14 of 25 positions shown; findings below may reference images not displayed]

FINDINGS: Scanning in the area of clinical concern in the right inguinal
region reveals a few small lymph nodes with normal fatty hila. The
largest of these measures 8 mm in short axis.
IMPRESSION: Scattered lymph nodes with normal fatty hila in the right inguinal
region.

## 2020-09-12 ENCOUNTER — Encounter: Payer: Self-pay | Admitting: Vascular Surgery

## 2020-09-18 ENCOUNTER — Telehealth: Payer: Self-pay | Admitting: *Deleted

## 2020-09-18 NOTE — Telephone Encounter (Signed)
Unable to speak directly with Andrew [name]. Left detailed telephone message.   Informed him that [facility] had denied pending authorization # [id number] CPT [id number] L GSV because there was no reflux documented in [facility] per venous reflux study 02-27-2020.  Recommended that Andrew [name] schedule repeat venous reflux study in 2022 and that he schedule it in the afternoon, make sure that he is well hydrated and that he not wear his compression hose the day of his ultrasound study.

## 2021-11-09 ENCOUNTER — Emergency Department (HOSPITAL_COMMUNITY)
Admission: EM | Admit: 2021-11-09 | Discharge: 2021-11-09 | Disposition: A | Discharge status: Home / Self Care | Payer: 59 | Attending: Emergency Medicine | Admitting: Emergency Medicine

## 2021-11-09 ENCOUNTER — Other Ambulatory Visit: Payer: Self-pay

## 2021-11-09 ENCOUNTER — Encounter (HOSPITAL_COMMUNITY): Payer: Self-pay | Admitting: Emergency Medicine

## 2021-11-09 DIAGNOSIS — E119 Type 2 diabetes mellitus without complications: Secondary | ICD-10-CM | POA: Insufficient documentation

## 2021-11-09 DIAGNOSIS — Z8546 Personal history of malignant neoplasm of prostate: Secondary | ICD-10-CM | POA: Diagnosis not present

## 2021-11-09 DIAGNOSIS — R310 Gross hematuria: Secondary | ICD-10-CM

## 2021-11-09 DIAGNOSIS — I10 Essential (primary) hypertension: Secondary | ICD-10-CM | POA: Insufficient documentation

## 2021-11-09 DIAGNOSIS — N3001 Acute cystitis with hematuria: Secondary | ICD-10-CM | POA: Diagnosis not present

## 2021-11-09 DIAGNOSIS — Z79899 Other long term (current) drug therapy: Secondary | ICD-10-CM | POA: Insufficient documentation

## 2021-11-09 DIAGNOSIS — Z7984 Long term (current) use of oral hypoglycemic drugs: Secondary | ICD-10-CM | POA: Diagnosis not present

## 2021-11-09 DIAGNOSIS — R319 Hematuria, unspecified: Secondary | ICD-10-CM | POA: Diagnosis present

## 2021-11-09 DIAGNOSIS — Z7982 Long term (current) use of aspirin: Secondary | ICD-10-CM | POA: Diagnosis not present

## 2021-11-09 LAB — CBC WITH DIFFERENTIAL/PLATELET
Abs Immature Granulocytes: 0.03 10*3/uL (ref 0.00–0.07)
Basophils Absolute: 0.1 10*3/uL (ref 0.0–0.1)
Basophils Relative: 1 %
Eosinophils Absolute: 0.1 10*3/uL (ref 0.0–0.5)
Eosinophils Relative: 2 %
HCT: 39.4 % (ref 39.0–52.0)
Hemoglobin: 13.4 g/dL (ref 13.0–17.0)
Immature Granulocytes: 1 %
Lymphocytes Relative: 27 %
Lymphs Abs: 1.8 10*3/uL (ref 0.7–4.0)
MCH: 30.7 pg (ref 26.0–34.0)
MCHC: 34 g/dL (ref 30.0–36.0)
MCV: 90.2 fL (ref 80.0–100.0)
Monocytes Absolute: 0.5 10*3/uL (ref 0.1–1.0)
Monocytes Relative: 8 %
Neutro Abs: 4 10*3/uL (ref 1.7–7.7)
Neutrophils Relative %: 61 %
Platelets: 232 10*3/uL (ref 150–400)
RBC: 4.37 MIL/uL (ref 4.22–5.81)
RDW: 13.5 % (ref 11.5–15.5)
WBC: 6.5 10*3/uL (ref 4.0–10.5)
nRBC: 0 % (ref 0.0–0.2)

## 2021-11-09 LAB — URINALYSIS, ROUTINE W REFLEX MICROSCOPIC
Glucose, UA: 100 mg/dL — AB
Ketones, ur: 15 mg/dL — AB
Nitrite: POSITIVE — AB
Protein, ur: >300 mg/dL — AB
Specific Gravity, Urine: 1.025 (ref 1.005–1.030)
pH: 6.5 (ref 5.0–8.0)

## 2021-11-09 LAB — URINALYSIS, MICROSCOPIC (REFLEX)
Bacteria, UA: NONE SEEN
Bacteria, UA: NONE SEEN
RBC / HPF: >50 RBC/hpf (ref 0–5)
RBC / HPF: >50 RBC/hpf (ref 0–5)

## 2021-11-09 LAB — BASIC METABOLIC PANEL
Anion gap: 13 (ref 5–15)
BUN: 29 mg/dL — ABNORMAL HIGH (ref 8–23)
CO2: 25 mmol/L (ref 22–32)
Calcium: 8.8 mg/dL — ABNORMAL LOW (ref 8.9–10.3)
Chloride: 98 mmol/L (ref 98–111)
Creatinine, Ser: 1.48 mg/dL — ABNORMAL HIGH (ref 0.61–1.24)
GFR, Estimated: 52 mL/min — ABNORMAL LOW (ref >60)
Glucose, Bld: 189 mg/dL — ABNORMAL HIGH (ref 70–99)
Potassium: 3.6 mmol/L (ref 3.5–5.1)
Sodium: 136 mmol/L (ref 135–145)

## 2021-11-09 MED ORDER — CEPHALEXIN 500 MG PO CAPS: 500.0000 mg | ORAL_CAPSULE | Freq: Four times a day (QID) | ORAL | 0 refills | Status: AC

## 2021-11-09 NOTE — ED Provider Notes (Signed)
[facility] EMERGENCY DEPARTMENT Provider Note   CSN: [id number] Arrival date & time: 11/09/21  0535     History Chief Complaint  Patient presents with   Urinary Retention    Andrew [name] is a 65 y.o. male.  Patient with history of diabetes and hypertension presents today with painless hematuria.  He states that 1 week ago he noticed trace amounts of blood in his urine 1 time followed by continual bloodless urine until last night where he had a significant amount of visibly bloody hematuria followed by several episodes through the night where he was only able to void a few drops at a time which prompted his visit today.  However, in the emergency department today prior to being seen he passed a large blood clot and has since been able to fully void.  He states that his urine continues to be bloody without visible clots.  He denies any history of this prior and states that he has never smoked. He denies fevers, flank pain, abdominal pain, dysuria, or penile discharge.  The history is provided by the patient. No language interpreter was used.      Past Medical History:  Diagnosis Date   Allergy    Chronic venous insufficiency    right leg, swells a small bit has some discoloration seeing primary care md for has not been officiall dx yet   Diabetes mellitus without complication ([place])    Hyperlipemia    Hypertension    Pre-diabetes    on januvia for   Prostate cancer [facility])     Patient Active Problem List   Diagnosis Date Noted   Malignant neoplasm of prostate ([place]) 07/12/2019    Past Surgical History:  Procedure Laterality Date   CIRCUMCISION     COLONOSCOPY     CYSTOSCOPY N/A 09/12/2019   Procedure: CYSTOSCOPY;  Surgeon: Lucas Mallow, MD;  Location: [facility];  Service: Urology;  Laterality: N/A;  No seeds detected in bladder per Dr. Gloriann Loan   PROSTATE BIOPSY  06/23/2019   RADIOACTIVE SEED IMPLANT N/A 09/12/2019   Procedure:  RADIOACTIVE SEED IMPLANT/BRACHYTHERAPY IMPLANT;  Surgeon: Lucas Mallow, MD;  Location: [place];  Service: Urology;  Laterality: N/A;   SPACE OAR INSTILLATION N/A 09/12/2019   Procedure: SPACE OAR INSTILLATION;  Surgeon: Lucas Mallow, MD;  Location: [facility];  Service: Urology;  Laterality: N/A;       Family History  Problem Relation Age of Onset   Vaginal cancer Mother    Bladder Cancer Mother    Rectal cancer Mother    Lung cancer Father    Breast cancer Sister    Colon cancer Sister    Prostate cancer Neg Hx    Colon polyps Neg Hx    Esophageal cancer Neg Hx    Stomach cancer Neg Hx     Social History   Tobacco Use   Smoking status: Never   Smokeless tobacco: Never  Vaping Use   Vaping Use: Never used  Substance Use Topics   Alcohol use: Yes    Comment: occ   Drug use: No    Home Medications Prior to Admission medications   Medication Sig Start Date End Date Taking? Authorizing Provider  amLODipine (NORVASC) 10 MG tablet Taking Monday wed and friday 10/03/19   [provider]  aspirin 81 MG tablet Take 81 mg by mouth daily.      [provider]  Azilsartan-Chlorthalidone 40-25 MG TABS Take by mouth daily.    [provider]  finasteride (PROSCAR) 5 MG tablet Take 5 mg by mouth daily. 08/12/19   [provider]  furosemide (LASIX) 20 MG tablet Take 1 tablet (20 mg total) by mouth daily. 01/03/16   Konrad Felix, PA  JANUVIA 50 MG tablet Take 50 mg by mouth daily. 03/04/19   [provider]  Multiple Vitamins-Minerals (MULTIVITAMIN WITH MINERALS) tablet Take 1 tablet by mouth daily. CENTRUM    [provider]  pravastatin (PRAVACHOL) 10 MG tablet Take 1 tablet (10 mg total) by mouth daily. Patient taking differently: Take 10 mg by mouth every evening.  01/03/16   Konrad Felix, PA  sildenafil (VIAGRA) 25 MG tablet Take 25 mg by mouth daily as needed for erectile  dysfunction.    [provider]  tamsulosin (FLOMAX) 0.4 MG CAPS capsule Take 0.4 mg by mouth daily. 05/19/19   [provider]    Allergies    Patient has no known allergies.  Review of Systems   Review of Systems  Constitutional:  Negative for chills and fever.  Gastrointestinal:  Negative for abdominal pain, blood in stool, diarrhea, nausea and vomiting.  Genitourinary:  Positive for hematuria. Negative for dysuria, flank pain, frequency, penile discharge, penile pain, penile swelling and urgency.  Musculoskeletal:  Negative for arthralgias and back pain.  Skin:  Negative for pallor.  Neurological:  Negative for dizziness, weakness, light-headedness and headaches.  Hematological:  Does not bruise/bleed easily.  Psychiatric/Behavioral:  Negative for confusion and decreased concentration.   All other systems reviewed and are negative.  Physical Exam Updated Vital Signs BP 128/76   Pulse 95   Temp 97.8 F (36.6 C) (Oral)   Resp 16   SpO2 96%   Physical Exam Vitals and nursing note reviewed.  Constitutional:      General: He is not in acute distress.    Appearance: Normal appearance. He is normal weight. He is not ill-appearing, toxic-appearing or diaphoretic.  HENT:     Head: Normocephalic and atraumatic.  Cardiovascular:     Rate and Rhythm: Normal rate and regular rhythm.     Heart sounds: Normal heart sounds.  Pulmonary:     Effort: Pulmonary effort is normal. No respiratory distress.     Breath sounds: Normal breath sounds.  Abdominal:     General: Abdomen is flat. Bowel sounds are normal.     Palpations: Abdomen is soft.     Tenderness: There is no right CVA tenderness or left CVA tenderness.  Musculoskeletal:        General: Normal range of motion.     Cervical back: Normal range of motion.  Skin:    General: Skin is warm and dry.  Neurological:     General: No focal deficit present.     Mental Status: He is alert.  Psychiatric:        Mood  and Affect: Mood normal.        Behavior: Behavior normal.    ED Results / Procedures / Treatments   Labs (all labs ordered are listed, but only abnormal results are displayed) Labs Reviewed  BASIC METABOLIC PANEL - Abnormal; Notable for the following components:      Result Value   Glucose, Bld 189 (*)    BUN 29 (*)    Creatinine, Ser 1.48 (*)    Calcium 8.8 (*)    GFR, Estimated 52 (*)    All  other components within normal limits  URINALYSIS, ROUTINE W REFLEX MICROSCOPIC - Abnormal; Notable for the following components:   Color, Urine RED (*)    APPearance TURBID (*)    Glucose, UA   (*)    Value: TEST NOT REPORTED DUE TO COLOR INTERFERENCE OF URINE PIGMENT   Hgb urine dipstick   (*)    Value: TEST NOT REPORTED DUE TO COLOR INTERFERENCE OF URINE PIGMENT   Bilirubin Urine   (*)    Value: TEST NOT REPORTED DUE TO COLOR INTERFERENCE OF URINE PIGMENT   Ketones, ur   (*)    Value: TEST NOT REPORTED DUE TO COLOR INTERFERENCE OF URINE PIGMENT   Protein, ur   (*)    Value: TEST NOT REPORTED DUE TO COLOR INTERFERENCE OF URINE PIGMENT   Nitrite   (*)    Value: TEST NOT REPORTED DUE TO COLOR INTERFERENCE OF URINE PIGMENT   Leukocytes,Ua   (*)    Value: TEST NOT REPORTED DUE TO COLOR INTERFERENCE OF URINE PIGMENT   All other components within normal limits  CBC WITH DIFFERENTIAL/PLATELET  URINALYSIS, MICROSCOPIC (REFLEX)    EKG None  Radiology No results found.  Procedures Procedures   Medications Ordered in ED Medications - No data to display  ED Course  I have reviewed the triage vital signs and the nursing notes.  Pertinent labs & imaging results that were available during my care of the patient were reviewed by me and considered in my medical decision making (see chart for details).    MDM Rules/Calculators/A&P                         Patient presents today with painless hematuria. Also states that he was experiencing urinary retention until earlier today when he  passed a large blood clot. He has been able to void without difficulty since passing clot.   BMP without acute change to kidney function, Creatinine noted to be 1.48 which is same as 2 years ago. No anemia or leukocytosis. UA unable to be fully analyzed due to color interference of urine pigment. Will send for culture.  Patient is hemodynamically stable, afebrile, nontoxic-appearing, and in no acute distress.  Low suspicion for kidney pathology in the presence of unchanged BUN and creatinine.  Additionally, patient is able to void without difficulty and therefore I do not feel that patient is currently obstructed.  Given patients presentation of 24 hours of painless hematuria without anemia or change in kidney function, I feel that patient is cleared for discharge with antibiotics for possible UTI at this time.  Will give urology referral for further evaluation of his symptoms.  Patient amenable with plan of discharge, educated on red flag symptoms that would prompt immediate return such as signs of obstruction.  Patient discharged in stable condition.  Findings and plan of care discussed with supervising physician Dr. Rogene Houston who is in agreement.     Final Clinical Impression(s) / ED Diagnoses Final diagnoses:  Gross hematuria  Acute cystitis with hematuria    Rx / DC Orders ED Discharge Orders          Ordered    cephALEXin (KEFLEX) 500 MG capsule  4 times daily        11/09/21 1324          An After Visit Summary was printed and given to the patient.    Nestor Lewandowsky 11/09/21 1328    Fredia Sorrow, MD  11/10/21 0735  

## 2021-11-09 NOTE — Discharge Instructions (Addendum)
You were seen today for evaluation of your bloody urine. As we discussed, a urinary tract infection can cause your symptoms and therefore I have prescribed you an antibiotic for this. Please take your full course of antibiotics as prescribed.  Additionally I have given you a referral to urology for further management, please call them within the next few days to schedule an appointment.  Return if development of new or worsening symptoms such as inability to pee, development of fevers or chills, lightheadedness, dizziness, or shortness of breath.

## 2021-11-09 NOTE — ED Triage Notes (Signed)
Patient here with hematuria.  Patient states that he has the urge to urinate, but only a few drops comes out and he states that it is bloody.  Patient denies any pain at this time.  Patient states that he does not feel full at this time.

## 2022-01-13 ENCOUNTER — Other Ambulatory Visit: Payer: Self-pay

## 2022-01-13 ENCOUNTER — Encounter (HOSPITAL_BASED_OUTPATIENT_CLINIC_OR_DEPARTMENT_OTHER): Payer: 59 | Attending: Internal Medicine | Admitting: Internal Medicine

## 2022-01-13 ENCOUNTER — Ambulatory Visit (HOSPITAL_COMMUNITY)
Admission: RE | Admit: 2022-01-13 | Discharge: 2022-01-13 | Disposition: A | Discharge status: Home / Self Care | Payer: 59 | Source: Ambulatory Visit | Attending: Internal Medicine | Admitting: Internal Medicine

## 2022-01-13 ENCOUNTER — Other Ambulatory Visit (HOSPITAL_COMMUNITY): Payer: Self-pay | Admitting: Internal Medicine

## 2022-01-13 DIAGNOSIS — N3041 Irradiation cystitis with hematuria: Secondary | ICD-10-CM

## 2022-01-13 DIAGNOSIS — C61 Malignant neoplasm of prostate: Secondary | ICD-10-CM

## 2022-01-13 DIAGNOSIS — E1169 Type 2 diabetes mellitus with other specified complication: Secondary | ICD-10-CM | POA: Diagnosis not present

## 2022-01-13 DIAGNOSIS — Y842 Radiological procedure and radiotherapy as the cause of abnormal reaction of the patient, or of later complication, without mention of misadventure at the time of the procedure: Secondary | ICD-10-CM | POA: Insufficient documentation

## 2022-01-13 DIAGNOSIS — Z9289 Personal history of other medical treatment: Secondary | ICD-10-CM

## 2022-01-13 NOTE — Progress Notes (Addendum)
ZYIR, GASSERT ([id number]) Visit Report for 01/13/2022 Chief Complaint Document Details Patient Name: Date of Service: Andrew [name], Andrew [name] [place] LD E. 01/13/2022 7:45 A M Medical Record Number: [id number] Patient Account Number: [id number] Date of Birth/Sex: Treating RN: 1956/07/26 (66 y.o. Andrew [name] Primary Care Provider: Vincente [name] Other Clinician: Referring Provider: Treating Provider/Extender: Andrew [name]: 0 Information Obtained from: Patient Chief Complaint Discussed possible HBO [name] for Hematuria secondary to radiation cystitis Electronic Signature(s) Signed: 01/13/2022 11:51:48 AM By: Andrew [name] Andrew [name] Entered By: Andrew [name] on 01/13/2022 11:23:47 -------------------------------------------------------------------------------- HPI Details Patient Name: Date of Service: Andrew [name], Andrew [name] NA LD E. 01/13/2022 7:45 A M Medical Record Number: [id number] Patient Account Number: [id number] Date of Birth/Sex: Treating RN: 07-30-1956 (66 y.o. Andrew [name] Primary Care Provider: Vincente [name] Other Clinician: Referring Provider: Treating Provider/Extender: Andrew [name]: 0 History of Present Illness HPI Description: Mr. Andrew [name] is a 66 year old male with a past medical history of prostate cancer, hypertension and type 2 diabetes that presents to the clinic to discuss HBO therapy for hematuria secondary to radiation cystitis. He was referred to our clinic by his urologist Dr. Link [name]. On 09/12/2019 he underwent brachytherapy seed implant and spaceOAR placement with Dr. Gloriann [name] and Dr. Tammi [name]. He had been doing well up until November 2022 when he developed painless gross hematuria With clots. He had a cystoscopy He states that the symptoms have waxed and waned over the past several months. He reports no improvement in his symptoms. He currently denies signs of infection. Electronic  Signature(s) Signed: 01/13/2022 11:51:48 AM By: Andrew [name] Andrew [name] Entered By: Andrew [name] on 01/13/2022 11:37:38 -------------------------------------------------------------------------------- Physical Exam Details Patient Name: Date of Service: Andrew [name], Andrew [name] NA LD E. 01/13/2022 7:45 A M Medical Record Number: [id number] Patient Account Number: [id number] Date of Birth/Sex: Treating RN: 1956/02/10 (66 y.o. Andrew [name] Primary Care Provider: Vincente [name] Other Clinician: Referring Provider: Treating Provider/Extender: Andrew [name]: 0 Constitutional respirations regular, non-labored and within target range for patient.Marland Kitchen Respiratory clear to auscultation bilaterally. Cardiovascular regular rate and rhythm with normal S1, S2. Psychiatric pleasant and cooperative. Electronic Signature(s) Signed: 01/13/2022 11:51:48 AM By: Andrew [name] Andrew [name] Entered By: Andrew [name] on 01/13/2022 11:31:27 -------------------------------------------------------------------------------- Physician Orders Details Patient Name: Date of Service: Andrew [name], Andrew [name] NA LD E. 01/13/2022 7:45 A M Medical Record Number: [id number] Patient Account Number: [id number] Date of Birth/Sex: Treating RN: Apr 30, 1956 (66 y.o. Andrew [name] Primary Care Provider: Vincente [name] Other Clinician: Referring Provider: Treating Provider/Extender: Andrew [name]: 0 Verbal / Phone Orders: No Diagnosis Coding Follow-up Appointments Other: - We will call you to set day and time to start Hyperbarics once we receive approval from your insurance. Hyperbaric Oxygen Therapy Evaluate for HBO Therapy Indication: - Radiation Cystitis If appropriate for [name], begin HBOT per protocol: 2.5 ATA for 90 Minutes with 2 Five (5) Minute A Breaks ir Total Number of Treatments: - 40 One treatments per day (delivered Monday through Friday  unless otherwise specified in Special Instructions below): Finger stick Blood Glucose Pre- and Post- HBOT [name]. Follow Hyperbaric Oxygen Glycemia Protocol A frin (Oxymetazoline HCL) 0.05% nasal spray - 1 spray in both nostrils daily as needed prior to HBO [name] for difficulty clearing ears Radiology X-ray, Chest, PA and lateral - Evaluation for Hyperbaric Oxygen Therapy [employer] Electrocardiogram (EKG) - Evaluation for Hyperbaric Oxygen Therapy GLYCEMIA INTERVENTIONS PROTOCOL PRE-HBO GLYCEMIA INTERVENTIONS ACTION INTERVENTION  Obtain pre-HBO capillary blood glucose (ensure 1 physician order is in chart). A. Notify HBO physician and await physician orders. 2 If result is 70 mg/dl or below: B. If the result meets the hospital definition of a critical result, follow hospital policy. A. Give patient an 8 ounce Glucerna Shake, an 8 ounce Ensure, or 8 ounces of a Glucerna/Ensure equivalent dietary supplement*. B. Wait 30 minutes. If result is 71 mg/dl to 130 mg/dl: C. Retest patients capillary blood glucose (CBG). D. If result greater than or equal to 110 mg/dl, proceed with HBO. If result less than 110 mg/dl, notify HBO physician and consider holding HBO. If result is 131 mg/dl to 249 mg/dl: A. Proceed with HBO. A. Notify HBO physician and await physician orders. B. It is recommended to hold HBO and Andrew [name] If result is 250 mg/dl or greater: blood/urine ketone testing. C. If the result meets the hospital definition of a critical result, follow hospital policy. POST-HBO GLYCEMIA INTERVENTIONS ACTION INTERVENTION Obtain post HBO capillary blood glucose (ensure 1 physician order is in chart). A. Notify HBO physician and await physician orders. 2 If result is 70 mg/dl or below: B. If the result meets the hospital definition of a critical result, follow hospital policy. A. Give patient an 8 ounce Glucerna Shake, an 8 ounce Ensure, or 8 ounces of  a Glucerna/Ensure equivalent dietary supplement*. B. Wait 15 minutes for symptoms of If result is 71 mg/dl to 100 mg/dl: hypoglycemia (i.e. nervousness, anxiety, sweating, chills, clamminess, irritability, confusion, tachycardia or dizziness). C. If patient asymptomatic, discharge patient. If patient symptomatic, repeat capillary blood glucose (CBG) and notify HBO physician. If result is 101 mg/dl to 249 mg/dl: A. Discharge patient. A. Notify HBO physician and await physician orders. B. It is recommended to Andrew [name] blood/urine ketone If result is 250 mg/dl or greater: testing. C. If the result meets the hospital definition of a critical result, follow hospital policy. *Juice or candies are NOT equivalent products. If patient refuses the Glucerna or Ensure, please consult the hospital dietitian for an appropriate substitute. Electronic Signature(s) Signed: 01/13/2022 11:51:48 AM By: Andrew [name] Andrew [name] Entered By: Andrew [name] on 01/13/2022 11:32:27 Prescription 01/13/2022 -------------------------------------------------------------------------------- Lynann Bologna E. Andrew [name] Andrew [name] Patient Name: Provider: Feb 15, 1956 [id number] Date of Birth: NPI#: Jerilynn Mages [id number] Sex: DEA #: [id number] [phone] Phone #: License #: [place] Patient Address: [address] [place], [place] [address] [place], [place] [address] [phone] Allergies No Known Drug Allergies Provider's Orders X-ray, Chest, PA and lateral - Evaluation for Hyperbaric Oxygen Therapy Hand Signature: Date(s): Prescription 01/13/2022 Abril, Harwood E. Andrew [name] Andrew [name] Patient Name: Provider: Apr 29, 1956 [id number] Date of Birth: NPI#: Jerilynn Mages [id number] Sex: DEA #: [phone] Phone #: License #: [place] Patient Address: [address] [place], [place] [address] [place],  [phone] Allergies No Known Drug Allergies Provider's Orders Electrocardiogram (EKG) - Evaluation for Hyperbaric Oxygen Therapy Hand Signature: Date(s): Electronic Signature(s) Signed: 01/13/2022 11:51:48 AM By: Andrew [name] Andrew [name] Entered By: Andrew [name] on 01/13/2022 11:32:27 -------------------------------------------------------------------------------- Problem List Details Patient Name: Date of Service: Andrew [name], Andrew [name] NA LD E. 01/13/2022 7:45 A M Medical Record Number: [id number] Patient Account Number: [id number] Date of Birth/Sex: Treating RN: 06/13/1956 (66 y.o. Andrew [name] Primary Care Provider: Vincente [name] Other Clinician: Referring Provider: Treating Provider/Extender: Andrew [name]: 0 Active Problems ICD-10 Encounter Code Description  Active Date MDM Diagnosis C61 Malignant neoplasm of prostate 01/13/2022 No Yes N30.41 Irradiation cystitis with hematuria 01/13/2022 No Yes 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 1/91/4782 No Yes I10 Essential (primary) hypertension 01/13/2022 No Yes Inactive Problems Resolved Problems Electronic Signature(s) Signed: 01/13/2022 11:51:48 AM By: Andrew [name] Andrew [name] Entered By: Andrew [name] on 01/13/2022 11:23:07 -------------------------------------------------------------------------------- Progress Note Details Patient Name: Date of Service: Andrew [name], Andrew [name] NA LD E. 01/13/2022 7:45 A M Medical Record Number: [id number] Patient Account Number: [id number] Date of Birth/Sex: Treating RN: October 17, 1956 (66 y.o. Andrew [name] Primary Care Provider: Vincente [name] Other Clinician: Referring Provider: Treating Provider/Extender: Andrew [name]: 0 Subjective Chief Complaint Information obtained from Patient Discussed possible HBO [name] for Hematuria secondary to  radiation cystitis History of Present Illness (HPI) Mr. Bernal Luhman is a 66 year old male with a past medical history of prostate cancer, hypertension and type 2 diabetes that presents to the clinic to discuss HBO therapy for hematuria secondary to radiation cystitis. He was referred to our clinic by his urologist Dr. Link [name]. On 09/12/2019 he underwent brachytherapy seed implant and spaceOAR placement with Dr. Gloriann [name] and Dr. Tammi [name]. He had been doing well up until November 2022 when he developed painless gross hematuria With clots. He had a cystoscopy He states that the symptoms have waxed and waned over the past several months. He reports no improvement in his symptoms. He currently denies signs of infection. Patient History Information obtained from Patient. Allergies No Known Drug Allergies Family History Unknown History. Social History Never smoker, Marital Status - Divorced, Alcohol Use - Rarely, Drug Use - No History, Caffeine Use - Daily - Coffee. Medical History Cardiovascular Patient has history of Hypertension Oncologic Patient has history of Received Radiation - seed implants Medical A Surgical History Notes nd Endocrine Pre-diabetic Review of Systems (ROS) Constitutional Symptoms (General Health) Denies complaints or symptoms of Fatigue, Fever, Chills, Marked Weight Change. Eyes Denies complaints or symptoms of Dry Eyes, Vision Changes, Glasses / Contacts. Ear/Nose/Mouth/Throat Denies complaints or symptoms of Chronic sinus problems or rhinitis. Respiratory Denies complaints or symptoms of Chronic or frequent coughs, Shortness of Breath. Gastrointestinal Denies complaints or symptoms of Frequent diarrhea, Nausea, Vomiting. Endocrine Denies complaints or symptoms of Heat/cold intolerance. Genitourinary Complains or has symptoms of Frequent urination, Hematuria with clots Integumentary (Skin) Denies complaints or symptoms of Wounds. Musculoskeletal Denies  complaints or symptoms of Muscle Pain, Muscle Weakness. Neurologic Denies complaints or symptoms of Numbness/parasthesias. Psychiatric Denies complaints or symptoms of Claustrophobia, Suicidal. Objective Constitutional respirations regular, non-labored and within target range for patient.. Vitals Time Taken: 7:49 AM, Height: 72 in, Source: Stated, Weight: 260 lbs, Source: Stated, BMI: 35.3, Temperature: 98.3 F, Pulse: 67 bpm, Respiratory Rate: 18 breaths/min, Blood Pressure: 159/91 mmHg. Respiratory clear to auscultation bilaterally. Cardiovascular regular rate and rhythm with normal S1, S2. Psychiatric pleasant and cooperative. Assessment Active Problems ICD-10 Malignant neoplasm of prostate Irradiation cystitis with hematuria Type 2 diabetes mellitus with other circulatory complications Essential (primary) hypertension Patient presents today to discuss HBO therapy for gross hematuria secondary to radiation cystitis. He had brachytherapy seed implant and spaceOAR placement on 09/12/2019. He developed gross hematuria in November 2022. He had a cystoscopy on 01/03/2022 that showed a prostate with borderline obstruction and mild hyperplasia. The bladder showed no trabeculation, tumors or stones with normal mucosa except for some hypervascularity consistent with radiation change. Dr. Gloriann [name] thinks that his symptoms are most likely related to radiation cystitis/prostatitis. We discussed the risks  and benefits of HBO therapy. He would like to proceed with this. We will order a chest x-ray and EKG today. 27 Minutes was spent on the encounter including face-to-face, EMR review and coronation of care Plan Follow-up Appointments: Other: - We will call you to set day and time to start Hyperbarics once we receive approval from your insurance. Hyperbaric Oxygen Therapy: Evaluate for HBO Therapy Indication: - Radiation Cystitis If appropriate for [name], begin HBOT per protocol: 2.5 ATA for  90 Minutes with 2 Five (5) Minute Air Breaks T Number of Treatments: - 40 otal One treatments per day (delivered Monday through Friday unless otherwise specified in Special Instructions below): Finger stick Blood Glucose Pre- and Post- HBOT [name]. Follow Hyperbaric Oxygen Glycemia Protocol Afrin (Oxymetazoline HCL) 0.05% nasal spray - 1 spray in both nostrils daily as needed prior to HBO [name] for difficulty clearing ears Radiology ordered were: X-ray, Chest, PA and lateral - Evaluation for Hyperbaric Oxygen Therapy ordered were: Electrocardiogram (EKG) - Evaluation for Hyperbaric Oxygen Therapy 1. Submit for HBO approval 2. Chest x-ray and EKG Electronic Signature(s) Signed: 01/14/2022 2:44:45 PM By: Andrew [name] Andrew [name] Previous Signature: 01/13/2022 11:51:48 AM Version By: Andrew [name] Andrew [name] Entered By: Andrew [name] on 01/14/2022 14:43:41 -------------------------------------------------------------------------------- HxROS Details Patient Name: Date of Service: Andrew [name], Andrew [name] NA LD E. 01/13/2022 7:45 A M Medical Record Number: [id number] Patient Account Number: [id number] Date of Birth/Sex: Treating RN: [birth year] (66 y.o. Andrew [name] Primary Care Provider: Vincente [name] Other Clinician: Referring Provider: Treating Provider/Extender: Andrew [name]: 0 Information Obtained From Patient Constitutional Symptoms (General Health) Complaints and Symptoms: Negative for: Fatigue; Fever; Chills; Marked Weight Change Eyes Complaints and Symptoms: Negative for: Dry Eyes; Vision Changes; Glasses / Contacts Ear/Nose/Mouth/Throat Complaints and Symptoms: Negative for: Chronic sinus problems or rhinitis Respiratory Complaints and Symptoms: Negative for: Chronic or frequent coughs; Shortness of Breath Gastrointestinal Complaints and Symptoms: Negative for: Frequent diarrhea; Nausea; Vomiting Endocrine Complaints and  Symptoms: Negative for: Heat/cold intolerance Medical History: Past Medical History Notes: Pre-diabetic Genitourinary Complaints and Symptoms: Positive for: Frequent urination Review of System Notes: Hematuria with clots Integumentary (Skin) Complaints and Symptoms: Negative for: Wounds Musculoskeletal Complaints and Symptoms: Negative for: Muscle Pain; Muscle Weakness Neurologic Complaints and Symptoms: Negative for: Numbness/parasthesias Psychiatric Complaints and Symptoms: Negative for: Claustrophobia; Suicidal Hematologic/Lymphatic Cardiovascular Medical History: Positive for: Hypertension Immunological Oncologic Medical History: Positive for: Received Radiation - seed implants Immunizations Pneumococcal Vaccine: Received Pneumococcal Vaccination: No Implantable Devices None Family and Social History Unknown History: Yes; Never smoker; Marital Status - Divorced; Alcohol Use: Rarely; Drug Use: No History; Caffeine Use: Daily - Coffee; Financial Concerns: No; Food, Clothing or Shelter Needs: No; Support System Lacking: No; Transportation Concerns: No Electronic Signature(s) Signed: 01/13/2022 11:51:48 AM By: Andrew [name] Andrew [name] Signed: 01/13/2022 5:11:23 PM By: Levan Hurst RN, BSN Entered By: Levan Hurst on 01/13/2022 08:02:05 -------------------------------------------------------------------------------- [place] Details Patient Name: Date of Service: Andrew [name], Andrew [name] NA LD E. 01/13/2022 Medical Record Number: [id number] Patient Account Number: [id number] Date of Birth/Sex: Treating RN: 10-29-1956 (66 y.o. Andrew [name] Primary Care Provider: Vincente [name] Other Clinician: Referring Provider: Treating Provider/Extender: Andrew [name]: 0 Diagnosis Coding ICD-10 Codes Code Description C61 Malignant neoplasm of prostate N30.41 Irradiation cystitis with hematuria E11.69 Type 2 diabetes mellitus with other  specified complication Facility Procedures CPT4 Code: [id number] Description: 38466 - WOUND CARE VISIT-LEV 3 EST PT Modifier: Quantity: 1 Physician Procedures : CPT4 Code Description Modifier [id number] - WC  PHYS LEVEL 4 - NEW PT ICD-10 Diagnosis Description C61 Malignant neoplasm of prostate N30.41 Irradiation cystitis with hematuria E11.69 Type 2 diabetes mellitus with other specified complication Quantity: 1 Electronic Signature(s) Signed: 01/13/2022 11:51:48 AM By: Andrew [name] Andrew [name] Entered By: Andrew [name] on 01/13/2022 11:51:04

## 2022-01-13 NOTE — Progress Notes (Signed)
CHRISTIPHER, RIEGER ([id number]) Visit Report for 01/13/2022 Abuse Risk Screen Details Patient Name: Date of Service: Andrew Estimable, DO [place] LD E. 01/13/2022 7:45 A M Medical Record Number: [id number] Patient Account Number: [id number] Date of Birth/Sex: Treating RN: [birth year] (66 y.o. [name] Primary Care Gaelan Glennon: [name] Other Clinician: Referring Iman Reinertsen: Treating Jazzlene Huot/Extender: [name] in Treatment: 0 Abuse Risk Screen Items Answer ABUSE RISK SCREEN: Has anyone close to you tried to hurt or harm you recentlyo No Do you feel uncomfortable with anyone in your familyo No Has anyone forced you do things that you didnt want to doo No Electronic Signature(s) Signed: 01/13/2022 5:11:23 PM By: Levan Hurst RN, BSN Entered By: Levan Hurst on 01/13/2022 08:02:10 -------------------------------------------------------------------------------- Activities of Daily Living Details Patient Name: Date of Service: Andrew Estimable, [name] 01/13/2022 7:45 A M Medical Record Number: [id number] Patient Account Number: [id number] Date of Birth/Sex: Treating RN: 1956/02/04 (66 y.o. [name] Primary Care Kartier Bennison: [name] Other Clinician: Referring Reema Chick: Treating Glenis Musolf/Extender: [name] in Treatment: 0 Activities of Daily Living Items Answer Activities of Daily Living (Please select one for each item) Drive Automobile Completely Able T Medications ake Completely Able Use T elephone Completely Able Care for Appearance Completely Able Use T oilet Completely Able Bath / Shower Completely Able Dress Self Completely Able Feed Self Completely Able Walk Completely Able Get In / Out Bed Completely Able Housework Completely Able Prepare Meals Completely [place] for Self Completely Able Electronic Signature(s) Signed: 01/13/2022 5:11:23 PM By: Levan Hurst RN,  BSN Entered By: Levan Hurst on 01/13/2022 08:02:26 -------------------------------------------------------------------------------- Education Screening Details Patient Name: Date of Service: Andrew Estimable, [name] 01/13/2022 7:45 A M Medical Record Number: [id number] Patient Account Number: [id number] Date of Birth/Sex: Treating RN: 21-Oct-1956 (66 y.o. [name] Primary Care Judea Fennimore: [name] Other Clinician: Referring Radha Coggins: Treating Brogan England/Extender: [name] in Treatment: 0 Primary Learner Assessed: Patient Learning Preferences/Education Level/Primary Language Learning Preference: Explanation, Demonstration, Printed Material Highest Education Level: College or Above Preferred Language: English Cognitive Barrier Language Barrier: No Translator Needed: No Memory Deficit: No Emotional Barrier: No Cultural/Religious Beliefs Affecting Medical Care: No Physical Barrier Impaired Vision: No Impaired Hearing: No Decreased Hand dexterity: No Knowledge/Comprehension Knowledge Level: High Comprehension Level: High Ability to understand written instructions: High Ability to understand verbal instructions: High Motivation Anxiety Level: Calm Cooperation: Cooperative Education Importance: Acknowledges Need Interest in Health Problems: Asks Questions Perception: Coherent Willingness to Engage in Self-Management High Activities: Readiness to Engage in Self-Management High Activities: Electronic Signature(s) Signed: 01/13/2022 5:11:23 PM By: Levan Hurst RN, BSN Entered By: Levan Hurst on 01/13/2022 08:02:43 -------------------------------------------------------------------------------- Fall Risk Assessment Details Patient Name: Date of Service: Andrew Estimable, [name] 01/13/2022 7:45 A M Medical Record Number: [id number] Patient Account Number: [id number] Date of Birth/Sex: Treating RN: 09/07/1956 (66 y.o. [name] Primary Care Gimena Buick: [name] Other Clinician: Referring Sreshta Cressler: Treating Levell Tavano/Extender: [name] in Treatment: 0 Fall Risk Assessment Items Have you had 2 or more falls in the last 12 monthso 0 No Have you had any fall that resulted in injury in the last 12 monthso 0 No FALLS RISK SCREEN History of falling - immediate or within 3 months 0 No Secondary diagnosis (Do you have 2 or more medical diagnoseso) 0 No Ambulatory aid None/bed rest/wheelchair/nurse 0 Yes Crutches/cane/walker 0 No Furniture 0 No Intravenous therapy Access/Saline/Heparin [place] Clap  0 No Gait/Transferring Normal/ bed rest/ wheelchair 0 Yes Weak (short steps with or without shuffle, stooped but able to lift head while walking, may seek 0 No support from furniture) Impaired (short steps with shuffle, may have difficulty arising from chair, head down, impaired 0 No balance) Mental Status Oriented to own ability 0 No Electronic Signature(s) Signed: 01/13/2022 5:11:23 PM By: Levan Hurst RN, BSN Entered By: Levan Hurst on 01/13/2022 08:02:58 -------------------------------------------------------------------------------- Nutrition Risk Screening Details Patient Name: Date of Service: Andrew Estimable, [name] 01/13/2022 7:45 A M Medical Record Number: [id number] Patient Account Number: [id number] Date of Birth/Sex: Treating RN: 01/19/1956 (66 y.o. [name] Primary Care Kemauri Musa: [name] Other Clinician: Referring Koltin Wehmeyer: Treating Elery Cadenhead/Extender: [name] in Treatment: 0 Height (in): 72 Weight (lbs): 260 Body Mass Index (BMI): 35.3 Nutrition Risk Screening Items Score Screening NUTRITION RISK SCREEN: I have an illness or condition that made me change the kind and/or amount of food I eat 0 No I eat fewer than two meals per day 0 No I eat few fruits and vegetables, or milk products 0 No I have  three or more drinks of beer, liquor or wine almost every day 0 No I have tooth or mouth problems that make it hard for me to eat 0 No I don't always have enough money to buy the food I need 0 No I eat alone most of the time 0 No I take three or more different prescribed or over-the-counter drugs a day 1 Yes Without wanting to, I have lost or gained 10 pounds in the last six months 0 No I am not always physically able to shop, cook and/or feed myself 0 No Nutrition Protocols Good Risk Protocol Moderate Risk Protocol 0 Provide education on nutrition High Risk Proctocol Risk Level: Good Risk Score: 1 Electronic Signature(s) Signed: 01/13/2022 5:11:23 PM By: Levan Hurst RN, BSN Entered By: Levan Hurst on 01/13/2022 08:03:38

## 2022-01-14 ENCOUNTER — Ambulatory Visit (HOSPITAL_COMMUNITY)
Admission: RE | Admit: 2022-01-14 | Discharge: 2022-01-14 | Disposition: A | Discharge status: Home / Self Care | Payer: 59 | Source: Ambulatory Visit | Attending: Internal Medicine | Admitting: Internal Medicine

## 2022-01-14 DIAGNOSIS — Z9289 Personal history of other medical treatment: Secondary | ICD-10-CM | POA: Diagnosis not present

## 2022-01-14 DIAGNOSIS — I4891 Unspecified atrial fibrillation: Secondary | ICD-10-CM | POA: Diagnosis present

## 2022-01-16 NOTE — Progress Notes (Signed)
[name], [name] ([id number]) Visit Report for 01/13/2022 Allergy List Details Patient Name: Date of Service: Andrew Estimable, DO [place] LD E. 01/13/2022 7:45 A M Medical Record Number: [id number] Patient Account Number: [id number] Date of Birth/Sex: Treating RN: April 10, 1956 (66 y.o. [name] Primary Care [name]: [name] Other Clinician: Referring [name]: Treating [name]/Extender: [name] in Treatment: 0 Allergies Active Allergies No Known Drug Allergies Allergy Notes Electronic Signature(s) Signed: 01/13/2022 5:11:23 PM By: Levan Hurst RN, BSN Entered By: Levan Hurst on 01/13/2022 07:57:01 -------------------------------------------------------------------------------- Arrival Information Details Patient Name: Date of Service: Andrew Estimable, [name] 01/13/2022 7:45 A M Medical Record Number: [id number] Patient Account Number: [id number] Date of Birth/Sex: Treating RN: 1956-02-09 (66 y.o. [name] Primary Care Caileb Rhue: [name] Other Clinician: Referring [name]: Treating [name]/Extender: [name] in Treatment: 0 Visit Information Patient Arrived: Ambulatory Arrival Time: 07:49 Accompanied By: self Transfer Assistance: None Patient Identification Verified: Yes Secondary Verification Process Completed: Yes Patient Requires Transmission-Based Precautions: No Patient Has Alerts: No Electronic Signature(s) Signed: 01/13/2022 5:11:23 PM By: Levan Hurst RN, BSN Entered By: Levan Hurst on 01/13/2022 07:55:47 -------------------------------------------------------------------------------- Clinic Level of Care Assessment Details Patient Name: Date of Service: Andrew Estimable, [name] 01/13/2022 7:45 A M Medical Record Number: [id number] Patient Account Number: [id number] Date of Birth/Sex: Treating RN: 1956/06/23 (66 y.o. [name] Primary Care [name]: [name]  Other Clinician: Referring [name]: Treating [name]/Extender: [name] in Treatment: 0 Clinic Level of Care Assessment Items TOOL 2 Quantity Score X- 1 0 Use when only an EandM is performed on the INITIAL visit ASSESSMENTS - Nursing Assessment / Reassessment X- 1 20 General Physical Exam (combine w/ comprehensive assessment (listed just below) when performed on new pt. evals) X- 1 25 Comprehensive Assessment (HX, ROS, Risk Assessments, Wounds Hx, etc.) ASSESSMENTS - Wound and Skin A ssessment / Reassessment []  - 0 Simple Wound Assessment / Reassessment - one wound []  - 0 Complex Wound Assessment / Reassessment - multiple wounds []  - 0 Dermatologic / Skin Assessment (not related to wound area) ASSESSMENTS - Ostomy and/or Continence Assessment and Care []  - 0 Incontinence Assessment and Management []  - 0 Ostomy Care Assessment and Management (repouching, etc.) PROCESS - Coordination of Care X - Simple Patient / Family Education for ongoing care 1 15 []  - 0 Complex (extensive) Patient / Family Education for ongoing care X- 1 10 Staff obtains Programmer, systems, Records, T Results / Process Orders est []  - 0 Staff telephones HHA, Nursing Homes / Clarify orders / etc []  - 0 Routine Transfer to another Facility (non-emergent condition) []  - 0 Routine Hospital Admission (non-emergent condition) X- 1 15 New Admissions / Biomedical engineer / Ordering NPWT Apligraf, etc. , []  - 0 Emergency Hospital Admission (emergent condition) X- 1 10 Simple Discharge Coordination []  - 0 Complex (extensive) Discharge Coordination PROCESS - Special Needs []  - 0 Pediatric / Minor Patient Management []  - 0 Isolation Patient Management []  - 0 Hearing / Language / Visual special needs []  - 0 Assessment of Community assistance (transportation, D/C planning, etc.) []  - 0 Additional assistance / Altered mentation []  - 0 Support Surface(s) Assessment (bed,  cushion, seat, etc.) INTERVENTIONS - Wound Cleansing / Measurement []  - 0 Wound Imaging (photographs - any number of wounds) []  - 0 Wound Tracing (instead of photographs) []  - 0 Simple Wound Measurement - one wound []  - 0 Complex Wound Measurement - multiple wounds []  - 0  Simple Wound Cleansing - one wound []  - 0 Complex Wound Cleansing - multiple wounds INTERVENTIONS - Wound Dressings []  - 0 Small Wound Dressing one or multiple wounds []  - 0 Medium Wound Dressing one or multiple wounds []  - 0 Large Wound Dressing one or multiple wounds []  - 0 Application of Medications - injection INTERVENTIONS - Miscellaneous []  - 0 External ear exam []  - 0 Specimen Collection (cultures, biopsies, blood, body fluids, etc.) []  - 0 Specimen(s) / Culture(s) sent or taken to Lab for analysis []  - 0 Patient Transfer (multiple staff / Harrel Lemon Lift / Similar devices) []  - 0 Simple Staple / Suture removal (25 or less) []  - 0 Complex Staple / Suture removal (26 or more) []  - 0 Hypo / Hyperglycemic Management (close monitor of Blood Glucose) []  - 0 Ankle / Brachial Index (ABI) - do not check if billed separately Has the patient been seen at the hospital within the last three years: Yes Total Score: 95 Level Of Care: New/Established - Level 3 Electronic Signature(s) Signed: 01/13/2022 5:11:23 PM By: Levan Hurst RN, BSN Entered By: Levan Hurst on 01/13/2022 09:01:34 -------------------------------------------------------------------------------- Multi Wound Chart Details Patient Name: Date of Service: Andrew Estimable, [name] 01/13/2022 7:45 A M Medical Record Number: [id number] Patient Account Number: [id number] Date of Birth/Sex: Treating RN: 1956-10-07 (66 y.o. [name] Primary Care [name]: [name] Other Clinician: Referring Jakyrie Totherow: Treating Brigett Estell/Extender: [name] in Treatment: 0 Vital Signs Height(in): 72 Pulse(bpm):  67 Weight(lbs): 260 Blood Pressure(mmHg): 159/91 Body Mass Index(BMI): 35.3 Temperature(F): 98.3 Respiratory Rate(breaths/min): 18 Wound Assessments Treatment Notes Electronic Signature(s) Signed: 01/13/2022 11:51:48 AM By: [name] DO Signed: 01/13/2022 3:38:08 PM By: Lorrin Jackson Entered By: [name] on 01/13/2022 11:23:13 -------------------------------------------------------------------------------- Multi-Disciplinary Care Plan Details Patient Name: Date of Service: Andrew Estimable, [name] 01/13/2022 7:45 A M Medical Record Number: [id number] Patient Account Number: [id number] Date of Birth/Sex: Treating RN: 04/29/1956 (66 y.o. [name] Primary Care Reka Wist: [name] Other Clinician: Referring Aerilyn Slee: Treating Rotunda Worden/Extender: [name] in Treatment: 0 Multidisciplinary Care Plan reviewed with physician Active Inactive HBO Nursing Diagnoses: Anxiety related to feelings of confinement associated with the hyperbaric oxygen chamber Anxiety related to knowledge deficit of hyperbaric oxygen therapy and treatment procedures Discomfort related to temperature and humidity changes inside hyperbaric chamber Potential for barotraumas to ears, sinuses, teeth, and lungs or cerebral gas embolism related to changes in atmospheric pressure inside hyperbaric oxygen chamber Potential for oxygen toxicity seizures related to delivery of 100% oxygen at an increased atmospheric pressure Potential for pulmonary oxygen toxicity related to delivery of 100% oxygen at an increased atmospheric pressure Goals: Barotrauma will be prevented during HBO2 Date Initiated: 01/13/2022 T arget Resolution Date: 03/14/2022 Goal Status: Active Patient and/or family will be able to state/discuss factors appropriate to the management of their disease process during treatment Date Initiated: 01/13/2022 T arget Resolution Date: 03/14/2022 Goal Status:  Active Patient will tolerate the hyperbaric oxygen therapy treatment Date Initiated: 01/13/2022 T arget Resolution Date: 03/14/2022 Goal Status: Active Patient will tolerate the internal climate of the chamber Date Initiated: 01/13/2022 T arget Resolution Date: 03/14/2022 Goal Status: Active Patient/caregiver will verbalize understanding of HBO goals, rationale, procedures and potential hazards Date Initiated: 01/13/2022 T arget Resolution Date: 03/14/2022 Goal Status: Active Signs and symptoms of pulmonary oxygen toxicity will be recognized and promptly addressed Date Initiated: 01/13/2022 T arget Resolution Date: 03/14/2022 Goal Status: Active Signs and symptoms of seizure will  be recognized and promptly addressed ; seizing patients will suffer no harm Date Initiated: 01/13/2022 T arget Resolution Date: 03/14/2022 Goal Status: Active Interventions: Administer a five (5) minute air break for patient if signs and symptoms of seizure appear and notify the hyperbaric physician Administer decongestants, per physician orders, prior to HBO2 Administer the correct therapeutic gas delivery based on the patients needs and limitations, per physician order Assess and provide for patients comfort related to the hyperbaric environment and equalization of middle ear Assess for signs and symptoms related to adverse events, including but not limited to confinement anxiety, pneumothorax, oxygen toxicity and baurotrauma Assess patient for any history of confinement anxiety Assess patient's knowledge and expectations regarding hyperbaric medicine and provide education related to the hyperbaric environment, goals of treatment and prevention of adverse events Implement protocols to decrease risk of pneumothorax in high risk patients Notes: Electronic Signature(s) Signed: 01/13/2022 5:11:23 PM By: Levan Hurst RN, BSN Entered By: Levan Hurst on 01/13/2022  09:00:22 -------------------------------------------------------------------------------- Pain Assessment Details Patient Name: Date of Service: Andrew Estimable, [name] 01/13/2022 7:45 A M Medical Record Number: [id number] Patient Account Number: [id number] Date of Birth/Sex: Treating RN: [birth year] (66 y.o. [name] Primary Care Merrill Villarruel: [name] Other Clinician: Referring Jayvyn Haselton: Treating Vasiliki Smaldone/Extender: [name] in Treatment: 0 Active Problems Location of Pain Severity and Description of Pain Patient Has Paino No Site Locations Pain Management and Medication Current Pain Management: Electronic Signature(s) Signed: 01/13/2022 5:11:23 PM By: Levan Hurst RN, BSN Entered By: Levan Hurst on 01/13/2022 08:06:09 -------------------------------------------------------------------------------- Patient/Caregiver Education Details Patient Name: Date of Service: Andrew Estimable, [name] 1/23/2023andnbsp7:45 A M Medical Record Number: [id number] Patient Account Number: [id number] Date of Birth/Gender: Treating RN: 1956/06/10 (66 y.o. [name] Primary Care Physician: [name] Other Clinician: Referring Physician: Treating Physician/Extender: [name] in Treatment: 0 Education Assessment Education Provided To: Patient Education Topics Provided Hyperbaric Oxygenation: Methods: Explain/Verbal Responses: State content correctly Electronic Signature(s) Signed: 01/13/2022 5:11:23 PM By: Levan Hurst RN, BSN Entered By: Levan Hurst on 01/13/2022 09:00:44 -------------------------------------------------------------------------------- [place] Details Patient Name: Date of Service: Andrew Estimable, [name] 01/13/2022 7:45 A M Medical Record Number: [id number] Patient Account Number: [id number] Date of Birth/Sex: Treating RN: July 09, 1956 (66 y.o. [name] Primary Care  Billye Pickerel: [name] Other Clinician: Referring Nicholis Stepanek: Treating Jordyn Hofacker/Extender: [name] in Treatment: 0 Vital Signs Time Taken: 07:49 Temperature (F): 98.3 Height (in): 72 Pulse (bpm): 67 Source: Stated Respiratory Rate (breaths/min): 18 Weight (lbs): 260 Blood Pressure (mmHg): 159/91 Source: Stated Reference Range: 80 - 120 mg / dl Body Mass Index (BMI): 35.3 Electronic Signature(s) Signed: 01/16/2022 3:23:49 PM By: Sandre Kitty Entered By: Sandre Kitty on 01/13/2022 07:49:45

## 2022-01-20 ENCOUNTER — Other Ambulatory Visit: Payer: Self-pay

## 2022-01-20 ENCOUNTER — Encounter (HOSPITAL_BASED_OUTPATIENT_CLINIC_OR_DEPARTMENT_OTHER): Payer: 59 | Admitting: Internal Medicine

## 2022-01-20 DIAGNOSIS — E1169 Type 2 diabetes mellitus with other specified complication: Secondary | ICD-10-CM | POA: Diagnosis not present

## 2022-01-20 DIAGNOSIS — Y842 Radiological procedure and radiotherapy as the cause of abnormal reaction of the patient, or of later complication, without mention of misadventure at the time of the procedure: Secondary | ICD-10-CM | POA: Diagnosis not present

## 2022-01-20 DIAGNOSIS — N3041 Irradiation cystitis with hematuria: Secondary | ICD-10-CM | POA: Diagnosis not present

## 2022-01-20 DIAGNOSIS — C61 Malignant neoplasm of prostate: Secondary | ICD-10-CM | POA: Diagnosis present

## 2022-01-20 LAB — GLUCOSE, CAPILLARY
Glucose-Capillary: 249 mg/dL — ABNORMAL HIGH (ref 70–99)
Glucose-Capillary: 159 mg/dL — ABNORMAL HIGH (ref 70–99)

## 2022-01-20 NOTE — Progress Notes (Addendum)
Andrew [name], Andrew [name] ([id number]) Visit Report for 01/20/2022 HBO Details Patient Name: Date of Service: [name], DO [place] LD E. 01/20/2022 8:00 A M Medical Record Number: [id number] Patient Account Number: [id number] Date of Birth/Sex: Treating RN: 09/01/1956 (66 y.o. [name] Primary Care Sabien Umland: [name] Other Clinician: [name] Referring Glennice Marcos: Treating Obrian Bulson/Extender: [name] in Treatment: 1 HBO Treatment Course Details Treatment Course Number: 1 Ordering Jesua Tamblyn: [name] T Treatments Ordered: otal 40 HBO Treatment Start Date: 01/20/2022 HBO Indication: Late Effect of Radiation HBO Treatment Details Treatment Number: 1 Patient Type: Outpatient Chamber Type: Monoplace Chamber Serial #: [id number] Treatment Protocol: 2.5 ATA with 90 minutes oxygen, with two 5 minute air breaks Treatment Details Compression Rate Down: 1.0 psi / minute De-Compression Rate Up: A breaks and breathing ir Compress Tx Pressure periods Decompress Decompress Begins Reached (leave unused spaces Begins Ends blank) Chamber Pressure (ATA 1 2.5 2.5 2.5 2.5 2.5 - - 2.5 1 ) Clock Time (24 hr) 08:39 09:21 09:51 09:56 10:26 10:31 - - 11:01 11:23 Treatment Length: 164 (minutes) Treatment Segments: 5 Vital Signs Capillary Blood Glucose Reference Range: 80 - 120 mg / dl HBO Diabetic Blood Glucose Intervention Range: <131 mg/dl or >249 mg/dl Time Vitals Blood Respiratory Capillary Blood Glucose Pulse Action Type: Pulse: Temperature: Taken: Pressure: Rate: Glucose (mg/dl): Meter #: Oximetry (%) Taken: Pre 08:13 135/92 94 18 98.5 249 2 Post 11:26 122/86 60 18 98 159 2 Treatment Response Treatment Toleration: Well Treatment Completion Status: Treatment Completed without Adverse Event Treatment Notes Placed patient in chamber compressing at a rate of 1.0 psi/min with a few stops for ear equalization. 0900 at approximately 7 psi patient  stated that his ears were not clearing. Reduced pressure until reaching 6 psi, patient was able to equalize pressure. Pressure was increased until reaching 12 psi at which time patient stated his ears weren't clearing again. Pressure was reduced and upon reaching 11 psi patient stated he was able to equilibrate pressure in his ears. Continued pressurization without further issues to treatment depth of 2.5 ATA. During decompression patient stated that his ears were not clearing at 5 psi and 2 psi. Pressure was increase in both instances by 1 psi allowing patient time to equalize pressure in his left ear. Patient states that ears feel stuffy. After changing into clothes, before departure, patient stated that his ears felt better. Physician HBO Attestation: I certify that I supervised this HBO treatment in accordance with Medicare guidelines. A trained emergency response team is readily available per Yes hospital policies and procedures. Continue HBOT as ordered. Yes Electronic Signature(s) Signed: 01/20/2022 2:10:38 PM By: [name] DO Previous Signature: 01/20/2022 11:40:27 AM Version By: [name] CHT EMT BS , , Previous Signature: 01/20/2022 9:43:12 AM Version By: [name] CHT EMT BS , , Entered By: [name] on 01/20/2022 13:57:00 -------------------------------------------------------------------------------- HBO Safety Checklist Details Patient Name: Date of Service: [name], [name] 01/20/2022 8:00 A M Medical Record Number: [id number] Patient Account Number: [id number] Date of Birth/Sex: Treating RN: 1956/01/17 (66 y.o. [name] Primary Care Aadyn Buchheit: [name] Other Clinician: [name] Referring Shandie Bertz: Treating Traven Davids/Extender: [name] in Treatment: 1 HBO Safety Checklist Items Safety Checklist Consent Form Signed Patient voided / foley secured and emptied When did you last eato  Breakfast Last dose of injectable or oral agent Yesterday Ostomy pouch emptied and vented if applicable NA All implantable devices assessed, documented and approved NA Intravenous access  site secured and place NA Valuables secured Linens and cotton and cotton/polyester blend (less than 51% polyester) Personal oil-based products / skin lotions / body lotions removed Wigs or hairpieces removed NA Smoking or tobacco materials removed NA Books / newspapers / magazines / loose paper removed Cologne, aftershave, perfume and deodorant removed Jewelry removed (may wrap wedding band) Make-up removed NA Hair care products removed Battery operated devices (external) removed Heating patches and chemical warmers removed NA Titanium eyewear removed Not wearing eyewear in chamber (titanium) Nail polish cured greater than 10 hours NA Casting material cured greater than 10 hours NA Hearing aids removed NA Loose dentures or partials removed NA Prosthetics have been removed NA Patient demonstrates correct use of air break device (if applicable) Patient concerns have been addressed Patient grounding bracelet on and cord attached to chamber Specifics for Inpatients (complete in addition to above) Medication sheet sent with patient NA Intravenous medications needed or due during therapy sent with patient NA Drainage tubes (e.g. nasogastric tube or chest tube secured and vented) NA Endotracheal or Tracheotomy tube secured NA Cuff deflated of air and inflated with saline NA Airway suctioned NA Notes Paper version used prior to initiating treatment. Electronic Signature(s) Signed: 01/20/2022 9:44:01 AM By: [name] CHT EMT BS , , Previous Signature: 01/20/2022 9:35:27 AM Version By: [name] CHT EMT BS , , Entered By: [name] on 01/20/2022 09:44:00

## 2022-01-20 NOTE — Progress Notes (Addendum)
Andrew [name], Andrew [name] ([id number]) Visit Report for 01/20/2022 SuperBill Details Patient Name: Date of Service: [name], DO [place] LD E. 01/20/2022 Medical Record Number: [id number] Patient Account Number: [id number] Date of Birth/Sex: Treating RN: 12-26-1955 (66 y.o. [name] Primary Care Provider: [name] Other Clinician: [name] Referring Provider: Treating Provider/Extender: [name] in Treatment: 1 Diagnosis Coding ICD-10 Codes Code Description N30.41 Irradiation cystitis with hematuria C61 Malignant neoplasm of prostate E11.69 Type 2 diabetes mellitus with other specified complication Facility Procedures CPT4 Code Description Modifier Quantity [id number] G0277-(Facility Use Only) HBOT full body chamber, 53min , 5 ICD-10 Diagnosis Description N30.41 Irradiation cystitis with hematuria C61 Malignant neoplasm of prostate E11.69 Type 2 diabetes mellitus with other specified complication Physician Procedures Quantity CPT4 Code Description Modifier [id number] - WC PHYS HYPERBARIC OXYGEN THERAPY 1 ICD-10 Diagnosis Description N30.41 Irradiation cystitis with hematuria C61 Malignant neoplasm of prostate E11.69 Type 2 diabetes mellitus with other specified complication Electronic Signature(s) Signed: 01/31/2022 12:29:46 PM By: [name] DO Signed: 02/10/2022 4:29:19 PM By: [name] CHT EMT BS , , Previous Signature: 01/20/2022 11:40:51 AM Version By: [name] CHT EMT BS , , Previous Signature: 01/20/2022 2:10:38 PM Version By: [name] DO Entered By: [name] on 01/28/2022 12:02:59

## 2022-01-21 ENCOUNTER — Encounter (HOSPITAL_BASED_OUTPATIENT_CLINIC_OR_DEPARTMENT_OTHER): Payer: 59 | Admitting: Internal Medicine

## 2022-01-21 DIAGNOSIS — C61 Malignant neoplasm of prostate: Secondary | ICD-10-CM | POA: Diagnosis not present

## 2022-01-21 LAB — GLUCOSE, CAPILLARY
Glucose-Capillary: 153 mg/dL — ABNORMAL HIGH (ref 70–99)
Glucose-Capillary: 143 mg/dL — ABNORMAL HIGH (ref 70–99)

## 2022-01-21 NOTE — Progress Notes (Signed)
CORTEZ, FLIPPEN ([id number]) Visit Report for 01/21/2022 SuperBill Details Patient Name: Date of Service: Andrew Estimable, DO [place] LD E. 01/21/2022 Medical Record Number: [id number] Patient Account Number: [id number] Date of Birth/Sex: Treating RN: 11-21-1956 (66 y.o. Andrew [name] Primary Care Provider: [name] Other Clinician: Donavan [name] Referring Provider: Treating Provider/Extender: [name] in Treatment: 1 Diagnosis Coding ICD-10 Codes Code Description C61 Malignant neoplasm of prostate N30.41 Irradiation cystitis with hematuria E11.69 Type 2 diabetes mellitus with other specified complication Facility Procedures CPT4 Code Description Modifier Quantity [id number] G0277-(Facility Use Only) HBOT full body chamber, 45min , 5 ICD-10 Diagnosis Description C61 Malignant neoplasm of prostate N30.41 Irradiation cystitis with hematuria E11.69 Type 2 diabetes mellitus with other specified complication Physician Procedures Quantity CPT4 Code Description Modifier [id number] 17471 - WC PHYS HYPERBARIC OXYGEN THERAPY 1 ICD-10 Diagnosis Description C61 Malignant neoplasm of prostate N30.41 Irradiation cystitis with hematuria E11.69 Type 2 diabetes mellitus with other specified complication Electronic Signature(s) Signed: 01/21/2022 10:45:26 AM By: Andrew [name] CHT EMT BS , , Signed: 01/21/2022 4:28:46 PM By: Andrew Ham MD Entered By: Andrew [name] on 01/21/2022 10:45:25

## 2022-01-21 NOTE — Progress Notes (Addendum)
Andrew [name], Andrew [name] ([id number]) Visit Report for 01/21/2022 Arrival Information Details Patient Name: Date of Service: [name], DO [place] LD E. 01/21/2022 8:00 A M Medical Record Number: [id number] Patient Account Number: [id number] Date of Birth/Sex: Treating RN: [birth year] (66 y.o. Andrew [name], Andrew [name] Primary Care Elfego Giammarino: [name] Other Clinician: [name] Referring Shifra Swartzentruber: Treating Jahmeir Geisen/Extender: [name] in Treatment: 1 Visit Information History Since Last Visit All ordered tests and consults were completed: Yes Patient Arrived: Ambulatory Added or deleted any medications: No Arrival Time: 07:49 Any new allergies or adverse reactions: No Accompanied By: self Had a fall or experienced change in No Transfer Assistance: None activities of daily living that may affect Patient Identification Verified: Yes risk of falls: Secondary Verification Process Completed: Yes Signs or symptoms of abuse/neglect since last visito No Patient Requires Transmission-Based Precautions: No Hospitalized since last visit: No Patient Has Alerts: No Implantable device outside of the clinic excluding No cellular tissue based products placed in the center since last visit: Pain Present Now: No Electronic Signature(s) Signed: 01/21/2022 10:35:57 AM By: [name] CHT EMT BS , , Entered By: [name] on 01/21/2022 10:35:57 -------------------------------------------------------------------------------- Encounter Discharge Information Details Patient Name: Date of Service: [name], [name] 01/21/2022 8:00 A M Medical Record Number: [id number] Patient Account Number: [id number] Date of Birth/Sex: Treating RN: 1956/01/26 (66 y.o. Andrew [name] Primary Care Bula Cavalieri: [name] Other Clinician: [name] Referring Kalden Wanke: Treating Alison Kubicki/Extender: [name] in Treatment: 1 Encounter  Discharge Information Items Discharge Condition: Stable Ambulatory Status: Ambulatory Discharge Destination: Home Transportation: Private Auto Accompanied By: self Schedule Follow-up Appointment: No Clinical Summary of Care: Electronic Signature(s) Signed: 01/21/2022 10:45:59 AM By: [name] CHT EMT BS , , Entered By: [name] on 01/21/2022 10:45:58 -------------------------------------------------------------------------------- Vitals Details Patient Name: Date of Service: [name], [name] 01/21/2022 8:00 A M Medical Record Number: [id number] Patient Account Number: [id number] Date of Birth/Sex: Treating RN: 01-14-1956 (66 y.o. Andrew [name] Primary Care Takyra Cantrall: [name] Other Clinician: [name] Referring Cherilynn Schomburg: Treating Azai Gaffin/Extender: [name] in Treatment: 1 Vital Signs Time Taken: 08:01 Temperature (F): 98.2 Height (in): 72 Pulse (bpm): 75 Weight (lbs): 260 Respiratory Rate (breaths/min): 20 Body Mass Index (BMI): 35.3 Blood Pressure (mmHg): 126/64 Capillary Blood Glucose (mg/dl): 153 Reference Range: 80 - 120 mg / dl Electronic Signature(s) Signed: 01/21/2022 10:36:27 AM By: [name] CHT EMT BS , , Entered By: [name] on 01/21/2022 10:36:27

## 2022-01-21 NOTE — Progress Notes (Addendum)
Andrew [name], LIVERS ([id number]) Visit Report for 01/21/2022 HBO Details Patient Name: Date of Service: Andrew Estimable, [name] 01/21/2022 8:00 A M Medical Record Number: [id number] Patient Account Number: [id number] Date of Birth/Sex: Treating RN: 1956/01/01 (66 y.o. Andrew [name] Primary Care Jerritt Cardoza: [name] Other Clinician: [name] Referring Axavier Pressley: Treating Ismeal Heider/Extender: [name] in Treatment: 1 HBO Treatment Course Details Treatment Course Number: 1 Ordering Adeleine Pask: [name] T Treatments Ordered: otal 40 HBO Treatment Start Date: 01/20/2022 HBO Indication: Late Effect of Radiation HBO Treatment Details Treatment Number: 2 Patient Type: Outpatient Chamber Type: Monoplace Chamber Serial #: [id number] Treatment Protocol: 2.5 ATA with 90 minutes oxygen, with two 5 minute air breaks Treatment Details Compression Rate Down: 1.0 psi / minute De-Compression Rate Up: 1.0 psi / minute A breaks and breathing ir Compress Tx Pressure periods Decompress Decompress Begins Reached (leave unused spaces Begins Ends blank) Chamber Pressure (ATA 1 2.5 2.5 2.5 2.5 2.5 - - 2.5 1 ) Clock Time (24 hr) 08:07 08:27 08:57 09:02 09:32 09:37 - - 10:07 10:29 Treatment Length: 142 (minutes) Treatment Segments: 5 Vital Signs Capillary Blood Glucose Reference Range: 80 - 120 mg / dl HBO Diabetic Blood Glucose Intervention Range: <131 mg/dl or >249 mg/dl Time Vitals Blood Respiratory Capillary Blood Glucose Pulse Action Type: Pulse: Temperature: Taken: Pressure: Rate: Glucose (mg/dl): Meter #: Oximetry (%) Taken: Pre 08:01 126/64 75 20 98.2 153 2 Post 10:29 122/80 59 18 98 143 2 Treatment Response Treatment Toleration: Well Treatment Completion Status: Treatment Completed without Adverse Event Treatment Notes Patient placed in the chamber, compressing at a rate of 1.0 psi/min with no needs for stops. Patient tolerated travel well  and appears to have an understanding of ear equalization techniques and communication with the technician if the travel rate needs to be altered. Patient tolerated decompression of the chamber as well. Patient states that treatment went better than yesterday. Gilberto Streck Notes No concerns with treatment given Physician HBO Attestation: I certify that I supervised this HBO treatment in accordance with Medicare guidelines. A trained emergency response team is readily available per Yes hospital policies and procedures. Continue HBOT as ordered. Yes Electronic Signature(s) Signed: 01/21/2022 4:28:46 PM By: Linton Ham MD Previous Signature: 01/21/2022 10:45:01 AM Version By: [name] CHT EMT BS , , Previous Signature: 01/21/2022 10:43:04 AM Version By: [name] CHT EMT BS , , Entered By: Linton Ham on 01/21/2022 16:27:36 -------------------------------------------------------------------------------- HBO Safety Checklist Details Patient Name: Date of Service: Andrew Estimable, [name] 01/21/2022 8:00 A M Medical Record Number: [id number] Patient Account Number: [id number] Date of Birth/Sex: Treating RN: [birth year] (66 y.o. Andrew [name] Primary Care Drevon Plog: [name] Other Clinician: [name] Referring Kally Cadden: Treating Rilan Eiland/Extender: [name] in Treatment: 1 HBO Safety Checklist Items Safety Checklist Consent Form Signed Patient voided / foley secured and emptied When did you last eato 0630 Last dose of injectable or oral agent Yesterday after HBO Tx Ostomy pouch emptied and vented if applicable NA All implantable devices assessed, documented and approved NA Intravenous access site secured and place NA Valuables secured Linens and cotton and cotton/polyester blend (less than 51% polyester) Personal oil-based products / skin lotions / body lotions removed Wigs or hairpieces removed NA Smoking or  tobacco materials removed NA Books / newspapers / magazines / loose paper removed Cologne, aftershave, perfume and deodorant removed Jewelry removed (may wrap wedding band) Make-up removed NA Hair care products removed NA Battery operated devices (  external) removed Heating patches and chemical warmers removed Titanium eyewear removed Not wearing eyewear in chamber (titanium) Nail polish cured greater than 10 hours NA Casting material cured greater than 10 hours NA Hearing aids removed NA Loose dentures or partials removed NA Prosthetics have been removed NA Patient demonstrates correct use of air break device (if applicable) Patient concerns have been addressed Patient grounding bracelet on and cord attached to chamber Specifics for Inpatients (complete in addition to above) Medication sheet sent with patient NA Intravenous medications needed or due during therapy sent with patient NA Drainage tubes (e.g. nasogastric tube or chest tube secured and vented) NA Endotracheal or Tracheotomy tube secured NA Cuff deflated of air and inflated with saline NA Airway suctioned NA Electronic Signature(s) Signed: 01/21/2022 10:38:31 AM By: [name] CHT EMT BS , , Entered By: [name] on 01/21/2022 10:38:31

## 2022-01-21 NOTE — Progress Notes (Addendum)
CHEVEYO, VIRGINIA ([id number]) Visit Report for 01/20/2022 Arrival Information Details Patient Name: Date of Service: [name], DO [place] LD E. 01/20/2022 8:00 A M Medical Record Number: [id number] Patient Account Number: [id number] Date of Birth/Sex: Treating RN: [birth year] (66 y.o. [name] Primary Care Elani Delph: [name] Other Clinician: [name] Referring Kimyah Frein: Treating Adar Rase/Extender: [name] in Treatment: 1 Visit Information History Since Last Visit All ordered tests and consults were completed: Yes Patient Arrived: Ambulatory Added or deleted any medications: No Arrival Time: 08:02 Any new allergies or adverse reactions: No Accompanied By: self Had a fall or experienced change in No Transfer Assistance: None activities of daily living that may affect Patient Identification Verified: Yes risk of falls: Secondary Verification Process Completed: Yes Signs or symptoms of abuse/neglect since last visito No Patient Requires Transmission-Based Precautions: No Hospitalized since last visit: No Patient Has Alerts: No Implantable device outside of the clinic excluding No cellular tissue based products placed in the center since last visit: Pain Present Now: No Electronic Signature(s) Signed: 01/20/2022 9:31:14 AM By: [name] CHT EMT BS , , Entered By: [name] on 01/20/2022 09:31:13 -------------------------------------------------------------------------------- Encounter Discharge Information Details Patient Name: Date of Service: [name], [name] 01/20/2022 8:00 A M Medical Record Number: [id number] Patient Account Number: [id number] Date of Birth/Sex: Treating RN: [birth year] (66 y.o. [name] Primary Care Heidi Maclin: [name] Other Clinician: [name] Referring Darrie Macmillan: Treating Tiffannie Sloss/Extender: [name] in Treatment: 1 Encounter  Discharge Information Items Discharge Condition: Stable Ambulatory Status: Ambulatory Discharge Destination: Home Transportation: Private Auto Accompanied By: self Schedule Follow-up Appointment: No Clinical Summary of Care: Electronic Signature(s) Signed: 01/20/2022 11:41:25 AM By: [name] CHT EMT BS , , Entered By: [name] on 01/20/2022 11:41:25 -------------------------------------------------------------------------------- Vitals Details Patient Name: Date of Service: [name], [name] 01/20/2022 8:00 A M Medical Record Number: [id number] Patient Account Number: [id number] Date of Birth/Sex: Treating RN: April 09, 1956 (66 y.o. [name] Primary Care Jaydin Boniface: [name] Other Clinician: [name] Referring Rian Koon: Treating Monesha Monreal/Extender: [name] in Treatment: 1 Vital Signs Time Taken: 08:13 Temperature (F): 98.5 Height (in): 72 Pulse (bpm): 94 Weight (lbs): 260 Respiratory Rate (breaths/min): 18 Body Mass Index (BMI): 35.3 Blood Pressure (mmHg): 135/92 Capillary Blood Glucose (mg/dl): 249 Reference Range: 80 - 120 mg / dl Electronic Signature(s) Signed: 01/20/2022 9:32:18 AM By: [name] CHT EMT BS , , Entered By: [name] on 01/20/2022 09:32:18

## 2022-01-22 ENCOUNTER — Encounter (HOSPITAL_BASED_OUTPATIENT_CLINIC_OR_DEPARTMENT_OTHER): Payer: 59 | Admitting: Physician Assistant

## 2022-01-22 ENCOUNTER — Encounter (HOSPITAL_BASED_OUTPATIENT_CLINIC_OR_DEPARTMENT_OTHER): Payer: 59 | Attending: Physician Assistant | Admitting: Physician Assistant

## 2022-01-22 ENCOUNTER — Other Ambulatory Visit: Payer: Self-pay

## 2022-01-22 DIAGNOSIS — I10 Essential (primary) hypertension: Secondary | ICD-10-CM | POA: Diagnosis not present

## 2022-01-22 DIAGNOSIS — E1159 Type 2 diabetes mellitus with other circulatory complications: Secondary | ICD-10-CM | POA: Diagnosis not present

## 2022-01-22 DIAGNOSIS — N3041 Irradiation cystitis with hematuria: Secondary | ICD-10-CM | POA: Insufficient documentation

## 2022-01-22 DIAGNOSIS — Y842 Radiological procedure and radiotherapy as the cause of abnormal reaction of the patient, or of later complication, without mention of misadventure at the time of the procedure: Secondary | ICD-10-CM | POA: Diagnosis not present

## 2022-01-22 DIAGNOSIS — C61 Malignant neoplasm of prostate: Secondary | ICD-10-CM | POA: Diagnosis present

## 2022-01-22 DIAGNOSIS — Z8546 Personal history of malignant neoplasm of prostate: Secondary | ICD-10-CM | POA: Insufficient documentation

## 2022-01-22 DIAGNOSIS — Z923 Personal history of irradiation: Secondary | ICD-10-CM | POA: Diagnosis not present

## 2022-01-22 LAB — GLUCOSE, CAPILLARY
Glucose-Capillary: 171 mg/dL — ABNORMAL HIGH (ref 70–99)
Glucose-Capillary: 142 mg/dL — ABNORMAL HIGH (ref 70–99)

## 2022-01-22 NOTE — Progress Notes (Signed)
Andrew [name], Andrew [name] ([id number]) Visit Report for 01/22/2022 HBO Details Patient Name: Date of Service: [name], [name] 01/22/2022 8:00 A M Medical Record Number: [id number] Patient Account Number: [id number] Date of Birth/Sex: Treating RN: Jun 22, 1956 (66 y.o. [name] Primary Care Angelic Schnelle: [name] Other Clinician: [name] Referring Phinley Schall: Treating Mylie Mccurley/Extender: Marrianne Mood in Treatment: 1 HBO Treatment Course Details Treatment Course Number: 1 Ordering Keylen Uzelac: [name] T Treatments Ordered: otal 40 HBO Treatment Start Date: 01/20/2022 HBO Indication: Late Effect of Radiation HBO Treatment Details Treatment Number: 3 Patient Type: Outpatient Chamber Type: Monoplace Chamber Serial #: [id number] Treatment Protocol: 2.5 ATA with 90 minutes oxygen, with two 5 minute air breaks Treatment Details Compression Rate Down: 1.0 psi / minute De-Compression Rate Up: A breaks and breathing ir Compress Tx Pressure periods Decompress Decompress Begins Reached (leave unused spaces Begins Ends blank) Chamber Pressure (ATA 1 2.5 2.5 2.5 2.5 2.5 - - 2.5 1 ) Clock Time (24 hr) 08:14 08:38 09:08 09:13 09:43 09:48 - - 10:18 10:39 Treatment Length: 145 (minutes) Treatment Segments: 5 Vital Signs Capillary Blood Glucose Reference Range: 80 - 120 mg / dl HBO Diabetic Blood Glucose Intervention Range: <131 mg/dl or >249 mg/dl Time Vitals Blood Respiratory Capillary Blood Glucose Pulse Action Type: Pulse: Temperature: Taken: Pressure: Rate: Glucose (mg/dl): Meter #: Oximetry (%) Taken: Pre 08:08 136/84 69 20 98.2 171 2 Post 10:43 120/81 57 18 97.7 142 2 Treatment Response Treatment Toleration: Well Treatment Completion Status: Treatment Completed without Adverse Event Treatment Notes Patient placed in chamber. Traveled at 1 psi/min to accommodate ear equalization. Patient had no issues with ear equalization during travel  today. Physician HBO Attestation: I certify that I supervised this HBO treatment in accordance with Medicare guidelines. A trained emergency response team is readily available per Yes hospital policies and procedures. Continue HBOT as ordered. Yes Electronic Signature(s) Signed: 01/22/2022 3:55:31 PM By: Worthy Keeler PA-C Previous Signature: 01/22/2022 11:14:37 AM Version By: [name] CHT EMT BS , , Entered By: Worthy Keeler on 01/22/2022 15:55:31 -------------------------------------------------------------------------------- HBO Safety Checklist Details Patient Name: Date of Service: [name], [name] 01/22/2022 8:00 A M Medical Record Number: [id number] Patient Account Number: [id number] Date of Birth/Sex: Treating RN: 1956-04-21 (66 y.o. [name] Primary Care Charma Mocarski: [name] Other Clinician: [name] Referring Bailey Faiella: Treating Kirandeep Fariss/Extender: Marrianne Mood in Treatment: 1 HBO Safety Checklist Items Safety Checklist Consent Form Signed Patient voided / foley secured and emptied When did you last eato Breakfast Last dose of injectable or oral agent Yesterday after HBO Tx Ostomy pouch emptied and vented if applicable NA All implantable devices assessed, documented and approved NA Intravenous access site secured and place NA Valuables secured Linens and cotton and cotton/polyester blend (less than 51% polyester) Personal oil-based products / skin lotions / body lotions removed Wigs or hairpieces removed NA Smoking or tobacco materials removed NA Books / newspapers / magazines / loose paper removed Cologne, aftershave, perfume and deodorant removed Jewelry removed (may wrap wedding band) Make-up removed NA Hair care products removed Battery operated devices (external) removed Heating patches and chemical warmers removed Titanium eyewear removed Not wearing eyewear in chamber (titanium) Nail  polish cured greater than 10 hours NA Casting material cured greater than 10 hours NA Hearing aids removed NA Loose dentures or partials removed NA Prosthetics have been removed NA Patient demonstrates correct use of air break device (if applicable) Patient concerns  have been addressed Patient grounding bracelet on and cord attached to chamber Specifics for Inpatients (complete in addition to above) Medication sheet sent with patient NA Intravenous medications needed or due during therapy sent with patient NA Drainage tubes (e.g. nasogastric tube or chest tube secured and vented) NA Endotracheal or Tracheotomy tube secured NA Cuff deflated of air and inflated with saline NA Airway suctioned NA Notes Paper version used prior to treatment start. Electronic Signature(s) Signed: 01/22/2022 11:14:37 AM By: [name] CHT EMT BS , , Entered By: [name] on 01/22/2022 08:41:41

## 2022-01-22 NOTE — Progress Notes (Signed)
TREON, Andrew [name] ([id number]) Visit Report for 01/22/2022 SuperBill Details Patient Name: Date of Service: [name], DO [place] LD E. 01/22/2022 Medical Record Number: [id number] Patient Account Number: [id number] Date of Birth/Sex: Treating RN: 10/26/1956 (66 y.o. [name] Primary Care Provider: [name] Other Clinician: [name] Referring Provider: Treating Provider/Extender: Marrianne Mood in Treatment: 1 Diagnosis Coding ICD-10 Codes Code Description C61 Malignant neoplasm of prostate N30.41 Irradiation cystitis with hematuria E11.69 Type 2 diabetes mellitus with other specified complication Facility Procedures CPT4 Code Description Modifier Quantity [id number] G0277-(Facility Use Only) HBOT full body chamber, 33min , 5 ICD-10 Diagnosis Description C61 Malignant neoplasm of prostate N30.41 Irradiation cystitis with hematuria E11.69 Type 2 diabetes mellitus with other specified complication Physician Procedures Quantity CPT4 Code Description Modifier [id number] 75643 - WC PHYS HYPERBARIC OXYGEN THERAPY 1 ICD-10 Diagnosis Description C61 Malignant neoplasm of prostate N30.41 Irradiation cystitis with hematuria E11.69 Type 2 diabetes mellitus with other specified complication Electronic Signature(s) Signed: 01/22/2022 11:14:37 AM By: [name] CHT EMT BS , , Signed: 01/22/2022 3:56:32 PM By: Worthy Keeler PA-C Entered By: [name] on 01/22/2022 11:04:55

## 2022-01-22 NOTE — Progress Notes (Addendum)
ORRIE, SCHUBERT ([id number]) Visit Report for 01/22/2022 Arrival Information Details Patient Name: Date of Service: [name], DO [place] LD E. 01/22/2022 8:00 A M Medical Record Number: [id number] Patient Account Number: [id number] Date of Birth/Sex: Treating RN: 21-Jan-1956 (66 y.o. [name], Vaughan Basta Primary Care Aemon Koeller: [name] Other Clinician: [name] Referring Suri Tafolla: Treating Osa Campoli/Extender: Marrianne Mood in Treatment: 1 Visit Information History Since Last Visit All ordered tests and consults were completed: Yes Patient Arrived: Ambulatory Added or deleted any medications: No Arrival Time: 07:53 Any new allergies or adverse reactions: No Accompanied By: self Had a fall or experienced change in No Transfer Assistance: None activities of daily living that may affect Patient Identification Verified: Yes risk of falls: Secondary Verification Process Completed: Yes Signs or symptoms of abuse/neglect since last visito No Patient Requires Transmission-Based Precautions: No Hospitalized since last visit: No Patient Has Alerts: No Implantable device outside of the clinic excluding No cellular tissue based products placed in the center since last visit: Pain Present Now: No Electronic Signature(s) Signed: 01/22/2022 8:39:10 AM By: [name] CHT EMT BS , , Entered By: [name] on 01/22/2022 08:39:10 -------------------------------------------------------------------------------- Encounter Discharge Information Details Patient Name: Date of Service: [name], [name] 01/22/2022 8:00 A M Medical Record Number: [id number] Patient Account Number: [id number] Date of Birth/Sex: Treating RN: 03-29-1956 (66 y.o. [name] Primary Care Amanda Steuart: [name] Other Clinician: [name] Referring Geffrey Michaelsen: Treating Karon Heckendorn/Extender: Marrianne Mood in Treatment: 1 Encounter  Discharge Information Items Discharge Condition: Stable Ambulatory Status: Ambulatory Discharge Destination: Home Transportation: Private Auto Accompanied By: self Schedule Follow-up Appointment: No Clinical Summary of Care: Electronic Signature(s) Signed: 01/22/2022 11:14:37 AM By: [name] CHT EMT BS , , Entered By: [name] on 01/22/2022 11:14:03 -------------------------------------------------------------------------------- Vitals Details Patient Name: Date of Service: [name], [name] 01/22/2022 8:00 A M Medical Record Number: [id number] Patient Account Number: [id number] Date of Birth/Sex: Treating RN: [birth year] (66 y.o. [name] Primary Care Rashawn Rolon: [name] Other Clinician: [name] Referring Adrien Shankar: Treating Eugenie Harewood/Extender: Marrianne Mood in Treatment: 1 Vital Signs Time Taken: 08:08 Temperature (F): 98.2 Height (in): 72 Pulse (bpm): 69 Weight (lbs): 260 Respiratory Rate (breaths/min): 20 Body Mass Index (BMI): 35.3 Blood Pressure (mmHg): 136/84 Capillary Blood Glucose (mg/dl): 171 Reference Range: 80 - 120 mg / dl Electronic Signature(s) Signed: 01/22/2022 11:14:37 AM By: [name] CHT EMT BS , , Entered By: [name] on 01/22/2022 08:39:52

## 2022-01-23 ENCOUNTER — Encounter (HOSPITAL_BASED_OUTPATIENT_CLINIC_OR_DEPARTMENT_OTHER): Payer: 59 | Admitting: Internal Medicine

## 2022-01-23 DIAGNOSIS — N3041 Irradiation cystitis with hematuria: Secondary | ICD-10-CM | POA: Diagnosis not present

## 2022-01-23 LAB — GLUCOSE, CAPILLARY
Glucose-Capillary: 151 mg/dL — ABNORMAL HIGH (ref 70–99)
Glucose-Capillary: 128 mg/dL — ABNORMAL HIGH (ref 70–99)

## 2022-01-23 NOTE — Progress Notes (Signed)
Andrew [name], Andrew [name] ([id number]) Visit Report for 01/23/2022 HBO Details Patient Name: Date of Service: Andrew Estimable, [name] 01/23/2022 8:00 A M Medical Record Number: [id number] Patient Account Number: [id number] Date of Birth/Sex: Treating RN: 03-Aug-1956 (66 y.o. Andrew [name], Andrew [name] Primary Care Andrew [name]: Andrew [name] Other Clinician: Donavan [name] Referring Andrew [name]: Treating Andrew [name]/Extender: Andrew [name] in Treatment: 1 HBO Treatment Course Details Treatment Course Number: 1 Ordering Andrew [name]: Andrew [name] T Treatments Ordered: otal 40 HBO Treatment Start Date: 01/20/2022 HBO Indication: Late Effect of Radiation HBO Treatment Details Treatment Number: 4 Patient Type: Outpatient Chamber Type: Monoplace Chamber Serial #: [id number] Treatment Protocol: 2.5 ATA with 90 minutes oxygen, with two 5 minute air breaks Treatment Details Compression Rate Down: 1.0 psi / minute De-Compression Rate Up: 1.0 psi / minute A breaks and breathing ir Compress Tx Pressure periods Decompress Decompress Begins Reached (leave unused spaces Begins Ends blank) Chamber Pressure (ATA 1 2.5 2.5 2.5 2.5 2.5 - - 2.5 1 ) Clock Time (24 hr) 08:06 08:30 09:00 09:05 09:35 09:40 - - 10:10 10:29 Treatment Length: 143 (minutes) Treatment Segments: 5 Vital Signs Capillary Blood Glucose Reference Range: 80 - 120 mg / dl HBO Diabetic Blood Glucose Intervention Range: <131 mg/dl or >249 mg/dl Time Vitals Blood Respiratory Capillary Blood Glucose Pulse Action Type: Pulse: Temperature: Taken: Pressure: Rate: Glucose (mg/dl): Meter #: Oximetry (%) Taken: Pre 08:02 135/85 78 20 98.4 151 Post 10:31 113/76 56 18 98 128 Treatment Response Treatment Toleration: Well Treatment Completion Status: Treatment Completed without Adverse Event Andrew [name] Notes No concerns with treatment given Physician HBO Attestation: I certify that I supervised this HBO treatment in accordance with  Medicare guidelines. A trained emergency response team is readily available per Yes hospital policies and procedures. Continue HBOT as ordered. Yes Electronic Signature(s) Signed: 01/23/2022 4:50:25 PM By: Andrew Ham MD Previous Signature: 01/23/2022 10:51:36 AM Version By: Andrew [name] CHT EMT BS , , Entered By: Andrew [name] on 01/23/2022 16:38:14 -------------------------------------------------------------------------------- HBO Safety Checklist Details Patient Name: Date of Service: Andrew Estimable, [name] 01/23/2022 8:00 A M Medical Record Number: [id number] Patient Account Number: [id number] Date of Birth/Sex: Treating RN: 1956/06/03 (66 y.o. Andrew [name], Andrew [name] Primary Care Janson Lamar: Andrew [name] Other Clinician: Donavan [name] Referring Andrew [name]: Treating Andrew [name]/Extender: Andrew [name] in Treatment: 1 HBO Safety Checklist Items Safety Checklist Consent Form Signed Patient voided / foley secured and emptied When did you last eato 0630 Last dose of injectable or oral agent Yesterday after HBO Tx Ostomy pouch emptied and vented if applicable NA All implantable devices assessed, documented and approved NA Intravenous access site secured and place NA Valuables secured Linens and cotton and cotton/polyester blend (less than 51% polyester) Personal oil-based products / skin lotions / body lotions removed Wigs or hairpieces removed NA Smoking or tobacco materials removed NA Books / newspapers / magazines / loose paper removed Cologne, aftershave, perfume and deodorant removed Jewelry removed (may wrap wedding band) Make-up removed NA Hair care products removed Battery operated devices (external) removed Heating patches and chemical warmers removed Titanium eyewear removed Not wearing eyewear in chamber (titanium) Nail polish cured greater than 10 hours NA Casting material cured greater than 10 hours NA Hearing aids  removed NA Loose dentures or partials removed NA Prosthetics have been removed NA Patient demonstrates correct use of air break device (if applicable) Patient concerns have been addressed Patient grounding bracelet on and cord attached to chamber Specifics for Inpatients (complete in addition to  above) Medication sheet sent with patient NA Intravenous medications needed or due during therapy sent with patient NA Drainage tubes (e.g. nasogastric tube or chest tube secured and vented) NA Endotracheal or Tracheotomy tube secured NA Cuff deflated of air and inflated with saline NA Airway suctioned NA Notes Paper version used prior to treatment start. Electronic Signature(s) Signed: 01/23/2022 10:51:36 AM By: Andrew [name] CHT EMT BS , , Entered By: Andrew [name] on 01/23/2022 08:18:45

## 2022-01-23 NOTE — Progress Notes (Signed)
MAYNOR, MWANGI ([id number]) Visit Report for 01/23/2022 Arrival Information Details Patient Name: Date of Service: [name], DO [place] LD E. 01/23/2022 8:00 A M Medical Record Number: [id number] Patient Account Number: [id number] Date of Birth/Sex: Treating RN: 03-31-1956 (66 y.o. [name], Meta.Reding Primary Care Kahlee Metivier: [name] Other Clinician: [name] Referring Akiyah Eppolito: Treating Caedin Mogan/Extender: [name] in Treatment: 1 Visit Information History Since Last Visit All ordered tests and consults were completed: Yes Patient Arrived: Ambulatory Added or deleted any medications: No Arrival Time: 07:50 Any new allergies or adverse reactions: No Accompanied By: self Had a fall or experienced change in No Transfer Assistance: None activities of daily living that may affect Patient Identification Verified: Yes risk of falls: Secondary Verification Process Completed: Yes Signs or symptoms of abuse/neglect since last visito No Patient Requires Transmission-Based Precautions: No Hospitalized since last visit: No Patient Has Alerts: No Implantable device outside of the clinic excluding No cellular tissue based products placed in the center since last visit: Pain Present Now: No Electronic Signature(s) Signed: 01/23/2022 10:51:36 AM By: [name] CHT EMT BS , , Entered By: [name] on 01/23/2022 07:58:24 -------------------------------------------------------------------------------- Encounter Discharge Information Details Patient Name: Date of Service: [name], [name] 01/23/2022 8:00 A M Medical Record Number: [id number] Patient Account Number: [id number] Date of Birth/Sex: Treating RN: February 11, 1956 (67 y.o. [name] Primary Care Neely Cecena: [name] Other Clinician: [name] Referring Christal Lagerstrom: Treating Neymar Dowe/Extender: [name] in Treatment: 1 Encounter  Discharge Information Items Discharge Condition: Stable Ambulatory Status: Ambulatory Discharge Destination: Home Transportation: Private Auto Accompanied By: self Schedule Follow-up Appointment: No Clinical Summary of Care: Electronic Signature(s) Signed: 01/23/2022 10:53:44 AM By: [name] CHT EMT BS , , Entered By: [name] on 01/23/2022 10:53:44 -------------------------------------------------------------------------------- Vitals Details Patient Name: Date of Service: [name], [name] 01/23/2022 8:00 A M Medical Record Number: [id number] Patient Account Number: [id number] Date of Birth/Sex: Treating RN: 04/21/1956 (66 y.o. [name], Meta.Reding Primary Care Yoali Conry: [name] Other Clinician: [name] Referring Imad Shostak: Treating Jahmya Onofrio/Extender: [name] in Treatment: 1 Vital Signs Time Taken: 08:02 Temperature (F): 98.4 Height (in): 72 Pulse (bpm): 78 Weight (lbs): 260 Respiratory Rate (breaths/min): 20 Body Mass Index (BMI): 35.3 Blood Pressure (mmHg): 135/85 Capillary Blood Glucose (mg/dl): 151 Reference Range: 80 - 120 mg / dl Electronic Signature(s) Signed: 01/23/2022 10:51:36 AM By: [name] CHT EMT BS , , Previous Signature: 01/23/2022 8:10:04 AM Version By: [name] CHT EMT BS , , Entered By: [name] on 01/23/2022 08:10:24

## 2022-01-23 NOTE — Progress Notes (Addendum)
ELICK, AGUILERA ([id number]) Visit Report for 01/23/2022 SuperBill Details Patient Name: Date of Service: [name], [name] 01/23/2022 Medical Record Number: [id number] Patient Account Number: [id number] Date of Birth/Sex: Treating RN: [birth year] (66 y.o. [name] Primary Care Provider: [name] Other Clinician: [name] Referring Provider: Treating Provider/Extender: [name] in Treatment: 1 Diagnosis Coding ICD-10 Codes Code Description C61 Malignant neoplasm of prostate N30.41 Irradiation cystitis with hematuria E11.69 Type 2 diabetes mellitus with other specified complication Facility Procedures CPT4 Code Description Modifier Quantity [id number] G0277-(Facility Use Only) HBOT full body chamber, 31min , 5 ICD-10 Diagnosis Description N30.41 Irradiation cystitis with hematuria C61 Malignant neoplasm of prostate E11.69 Type 2 diabetes mellitus with other specified complication Physician Procedures Quantity CPT4 Code Description Modifier [id number] - WC PHYS HYPERBARIC OXYGEN THERAPY 1 ICD-10 Diagnosis Description N30.41 Irradiation cystitis with hematuria C61 Malignant neoplasm of prostate E11.69 Type 2 diabetes mellitus with other specified complication Electronic Signature(s) Signed: 01/28/2022 2:14:56 PM By: Linton Ham MD Signed: 01/28/2022 3:12:59 PM By: [name] CHT EMT BS , , Previous Signature: 01/23/2022 10:51:36 AM Version By: [name] CHT EMT BS , , Previous Signature: 01/23/2022 4:50:25 PM Version By: Linton Ham MD Entered By: [name] on 01/28/2022 12:09:08

## 2022-01-24 ENCOUNTER — Other Ambulatory Visit: Payer: Self-pay

## 2022-01-24 ENCOUNTER — Encounter (HOSPITAL_BASED_OUTPATIENT_CLINIC_OR_DEPARTMENT_OTHER): Payer: 59 | Admitting: Internal Medicine

## 2022-01-24 DIAGNOSIS — C61 Malignant neoplasm of prostate: Secondary | ICD-10-CM | POA: Diagnosis not present

## 2022-01-24 DIAGNOSIS — N3041 Irradiation cystitis with hematuria: Secondary | ICD-10-CM | POA: Diagnosis not present

## 2022-01-24 DIAGNOSIS — E1169 Type 2 diabetes mellitus with other specified complication: Secondary | ICD-10-CM | POA: Diagnosis not present

## 2022-01-24 LAB — GLUCOSE, CAPILLARY
Glucose-Capillary: 134 mg/dL — ABNORMAL HIGH (ref 70–99)
Glucose-Capillary: 128 mg/dL — ABNORMAL HIGH (ref 70–99)

## 2022-01-24 NOTE — Progress Notes (Addendum)
TOMISLAV, MICALE ([id number]) Visit Report for 01/24/2022 Arrival Information Details Patient Name: Date of Service: [name], DO [place] LD E. 01/24/2022 8:00 A M Medical Record Number: [id number] Patient Account Number: [id number] Date of Birth/Sex: Treating RN: Sep 22, 1956 (66 y.o. [name], Vaughan Basta Primary Care Malaysha Arlen: [name] Other Clinician: [name] Referring Tylicia Sherman: Treating Anastasiya Gowin/Extender: [name] in Treatment: 1 Visit Information History Since Last Visit All ordered tests and consults were completed: Yes Patient Arrived: Ambulatory Added or deleted any medications: No Arrival Time: 07:49 Any new allergies or adverse reactions: No Accompanied By: self Had a fall or experienced change in No Transfer Assistance: None activities of daily living that may affect Patient Identification Verified: Yes risk of falls: Secondary Verification Process Completed: Yes Signs or symptoms of abuse/neglect since last visito No Patient Requires Transmission-Based Precautions: No Hospitalized since last visit: No Patient Has Alerts: No Implantable device outside of the clinic excluding No cellular tissue based products placed in the center since last visit: Pain Present Now: No Electronic Signature(s) Signed: 01/24/2022 9:50:17 AM By: [name] CHT EMT BS , , Entered By: [name] on 01/24/2022 09:45:17 -------------------------------------------------------------------------------- Encounter Discharge Information Details Patient Name: Date of Service: [name], [name] 01/24/2022 8:00 A M Medical Record Number: [id number] Patient Account Number: [id number] Date of Birth/Sex: Treating RN: [birth year] (66 y.o. [name] Primary Care Marco Raper: [name] Other Clinician: [name] Referring Laterrance Nauta: Treating Khushbu Pippen/Extender: [name] in Treatment: 1 Encounter  Discharge Information Items Discharge Condition: Stable Ambulatory Status: Wheelchair Discharge Destination: Home Transportation: Private Auto Accompanied By: self Schedule Follow-up Appointment: No Clinical Summary of Care: Electronic Signature(s) Signed: 01/24/2022 1:01:44 PM By: [name] CHT EMT BS , , Entered By: [name] on 01/24/2022 13:01:44 -------------------------------------------------------------------------------- Vitals Details Patient Name: Date of Service: [name], [name] 01/24/2022 8:00 A M Medical Record Number: [id number] Patient Account Number: [id number] Date of Birth/Sex: Treating RN: 1956/09/25 (66 y.o. [name] Primary Care Deby Adger: [name] Other Clinician: [name] Referring Shelia Magallon: Treating Khristie Sak/Extender: [name] in Treatment: 1 Vital Signs Time Taken: 08:12 Temperature (F): 98.2 Height (in): 72 Pulse (bpm): 66 Weight (lbs): 260 Respiratory Rate (breaths/min): 18 Body Mass Index (BMI): 35.3 Blood Pressure (mmHg): 123/68 Capillary Blood Glucose (mg/dl): 134 Reference Range: 80 - 120 mg / dl Electronic Signature(s) Signed: 01/24/2022 9:50:17 AM By: [name] CHT EMT BS , , Entered By: [name] on 01/24/2022 09:45:47

## 2022-01-24 NOTE — Progress Notes (Addendum)
DEJOUR, VOS ([id number]) Visit Report for 01/24/2022 HBO Details Patient Name: Date of Service: [name], [name] 01/24/2022 8:00 A M Medical Record Number: [id number] Patient Account Number: [id number] Date of Birth/Sex: Treating RN: [birth year] (66 y.o. Andrew [name] Primary Care Andrew [name]: [name] Other Clinician: [name] Referring Andrew [name]: Treating Andrew [name]/Extender: [name] in Treatment: 1 HBO Treatment Course Details Treatment Course Number: 1 Ordering Darwyn Ponzo: [name] T Treatments Ordered: otal 40 HBO Treatment Start Date: 01/20/2022 HBO Indication: Late Effect of Radiation HBO Treatment Details Treatment Number: 5 Patient Type: Outpatient Chamber Type: Monoplace Chamber Serial #: [id number] Treatment Protocol: 2.5 ATA with 90 minutes oxygen, with two 5 minute air breaks Treatment Details Compression Rate Down: 1.0 psi / minute De-Compression Rate Up: 1.0 psi / minute A breaks and breathing ir Compress Tx Pressure periods Decompress Decompress Begins Reached (leave unused spaces Begins Ends blank) Chamber Pressure (ATA 1 2.5 2.5 2.5 2.5 2.5 - - 2.5 1 ) Clock Time (24 hr) 08:19 08:44 09:14 09:19 09:49 09:54 - - 10:24 10:44 Treatment Length: 145 (minutes) Treatment Segments: 5 Vital Signs Capillary Blood Glucose Reference Range: 80 - 120 mg / dl HBO Diabetic Blood Glucose Intervention Range: <131 mg/dl or >249 mg/dl Time Vitals Blood Respiratory Capillary Blood Glucose Pulse Action Type: Pulse: Temperature: Taken: Pressure: Rate: Glucose (mg/dl): Meter #: Oximetry (%) Taken: Pre 08:12 123/68 66 18 98.2 134 Post 10:50 117/86 55 18 97.6 128 Treatment Response Treatment Toleration: Well Treatment Completion Status: Treatment Completed without Adverse Event Physician HBO Attestation: I certify that I supervised this HBO treatment in accordance with Medicare guidelines. A trained emergency  response team is readily available per Yes hospital policies and procedures. Continue HBOT as ordered. Yes Electronic Signature(s) Signed: 01/24/2022 12:29:41 PM By: [name] DO Entered By: [name] on 01/24/2022 12:23:48 -------------------------------------------------------------------------------- HBO Safety Checklist Details Patient Name: Date of Service: [name], [name] 01/24/2022 8:00 A M Medical Record Number: [id number] Patient Account Number: [id number] Date of Birth/Sex: Treating RN: 01/17/1956 (66 y.o. Andrew [name] Primary Care Andrew [name]: [name] Other Clinician: [name] Referring Andrew [name]: Treating Andrew [name]/Extender: [name] in Treatment: 1 HBO Safety Checklist Items Safety Checklist Consent Form Signed Patient voided / foley secured and emptied When did you last eato 0630 Last dose of injectable or oral agent Yesterday after HBO Tx Ostomy pouch emptied and vented if applicable NA All implantable devices assessed, documented and approved NA Intravenous access site secured and place NA Valuables secured Linens and cotton and cotton/polyester blend (less than 51% polyester) Personal oil-based products / skin lotions / body lotions removed Wigs or hairpieces removed NA Smoking or tobacco materials removed NA Books / newspapers / magazines / loose paper removed Cologne, aftershave, perfume and deodorant removed Jewelry removed (may wrap wedding band) Make-up removed NA Hair care products removed NA Battery operated devices (external) removed Heating patches and chemical warmers removed Titanium eyewear removed Not wearing eyewear in chamber (titanium) Nail polish cured greater than 10 hours NA Casting material cured greater than 10 hours NA Hearing aids removed NA Loose dentures or partials removed NA Prosthetics have been removed NA Patient demonstrates correct use of air  break device (if applicable) Patient concerns have been addressed Patient grounding bracelet on and cord attached to chamber Specifics for Inpatients (complete in addition to above) Medication sheet sent with patient NA Intravenous medications needed or due during therapy sent with patient NA Drainage tubes (  e.g. nasogastric tube or chest tube secured and vented) NA Endotracheal or Tracheotomy tube secured NA Cuff deflated of air and inflated with saline NA Airway suctioned NA Notes Paper version used prior to treatment start. Electronic Signature(s) Signed: 01/24/2022 9:50:17 AM By: [name] CHT EMT BS , , Entered By: [name] on 01/24/2022 09:48:54

## 2022-01-27 ENCOUNTER — Other Ambulatory Visit: Payer: Self-pay

## 2022-01-27 ENCOUNTER — Encounter (HOSPITAL_BASED_OUTPATIENT_CLINIC_OR_DEPARTMENT_OTHER): Payer: 59 | Admitting: Internal Medicine

## 2022-01-27 DIAGNOSIS — E1169 Type 2 diabetes mellitus with other specified complication: Secondary | ICD-10-CM | POA: Diagnosis not present

## 2022-01-27 DIAGNOSIS — N3041 Irradiation cystitis with hematuria: Secondary | ICD-10-CM

## 2022-01-27 DIAGNOSIS — C61 Malignant neoplasm of prostate: Secondary | ICD-10-CM

## 2022-01-27 LAB — GLUCOSE, CAPILLARY
Glucose-Capillary: 128 mg/dL — ABNORMAL HIGH (ref 70–99)
Glucose-Capillary: 159 mg/dL — ABNORMAL HIGH (ref 70–99)
Glucose-Capillary: 158 mg/dL — ABNORMAL HIGH (ref 70–99)

## 2022-01-27 NOTE — Progress Notes (Addendum)
BENFORD, ASCH ([id number]) Visit Report for 01/24/2022 SuperBill Details Patient Name: Date of Service: Andrew Estimable, DO [place] LD E. 01/24/2022 Medical Record Number: [id number] Patient Account Number: [id number] Date of Birth/Sex: Treating RN: [birth year] (66 y.o. [name] Primary Care Provider: [name] Other Clinician: [name] Referring Provider: Treating Provider/Extender: [name] in Treatment: 1 Diagnosis Coding ICD-10 Codes Code Description C61 Malignant neoplasm of prostate N30.41 Irradiation cystitis with hematuria E11.69 Type 2 diabetes mellitus with other specified complication Facility Procedures CPT4 Code Description Modifier Quantity [id number] G0277-(Facility Use Only) HBOT full body chamber, 62min , 5 ICD-10 Diagnosis Description N30.41 Irradiation cystitis with hematuria C61 Malignant neoplasm of prostate E11.69 Type 2 diabetes mellitus with other specified complication Physician Procedures Quantity CPT4 Code Description Modifier [id number] - WC PHYS HYPERBARIC OXYGEN THERAPY 1 ICD-10 Diagnosis Description N30.41 Irradiation cystitis with hematuria C61 Malignant neoplasm of prostate E11.69 Type 2 diabetes mellitus with other specified complication Electronic Signature(s) Signed: 01/28/2022 2:05:20 PM By: [name] DO Signed: 01/28/2022 3:12:59 PM By: [name] CHT EMT BS , , Previous Signature: 01/24/2022 12:29:41 PM Version By: [name] DO Previous Signature: 01/27/2022 11:58:20 AM Version By: [name] CHT EMT BS , , Entered By: [name] on 01/28/2022 12:22:48

## 2022-01-27 NOTE — Progress Notes (Signed)
DAEJON, LICH ([id number]) Visit Report for 01/27/2022 Arrival Information Details Patient Name: Date of Service: [name], DO [place] LD E. 01/27/2022 8:00 A M Medical Record Number: [id number] Patient Account Number: [id number] Date of Birth/Sex: Treating RN: 19-Jul-1956 (66 y.o. [name] Primary Care Dandra Velardi: [name] Other Clinician: [name] Referring Ruth Tully: Treating Shanyn Preisler/Extender: [name] in Treatment: 2 Visit Information History Since Last Visit All ordered tests and consults were completed: Yes Patient Arrived: Ambulatory Added or deleted any medications: No Arrival Time: 07:50 Any new allergies or adverse reactions: No Accompanied By: self Had a fall or experienced change in No Transfer Assistance: None activities of daily living that may affect Patient Identification Verified: Yes risk of falls: Secondary Verification Process Completed: Yes Signs or symptoms of abuse/neglect since last visito No Patient Requires Transmission-Based Precautions: No Hospitalized since last visit: No Patient Has Alerts: No Implantable device outside of the clinic excluding No cellular tissue based products placed in the center since last visit: Pain Present Now: No Electronic Signature(s) Signed: 01/27/2022 11:58:20 AM By: [name] CHT EMT BS , , Entered By: [name] on 01/27/2022 11:18:11 -------------------------------------------------------------------------------- Encounter Discharge Information Details Patient Name: Date of Service: [name], [name] 01/27/2022 8:00 A M Medical Record Number: [id number] Patient Account Number: [id number] Date of Birth/Sex: Treating RN: Apr 30, 1956 (66 y.o. [name] Primary Care Easter Schinke: [name] Other Clinician: [name] Referring Jisela Merlino: Treating Margurette Brener/Extender: [name] in Treatment: 2 Encounter  Discharge Information Items Discharge Condition: Stable Ambulatory Status: Ambulatory Discharge Destination: Home Transportation: Private Auto Accompanied By: self Schedule Follow-up Appointment: No Clinical Summary of Care: Electronic Signature(s) Signed: 01/27/2022 11:58:20 AM By: [name] CHT EMT BS , , Entered By: [name] on 01/27/2022 11:57:47 -------------------------------------------------------------------------------- Vitals Details Patient Name: Date of Service: [name], [name] 01/27/2022 8:00 A M Medical Record Number: [id number] Patient Account Number: [id number] Date of Birth/Sex: Treating RN: 01-23-1956 (66 y.o. [name] Primary Care Ashvik Grundman: [name] Other Clinician: [name] Referring Katianne Barre: Treating Talley Kreiser/Extender: [name] in Treatment: 2 Vital Signs Time Taken: 11:02 Temperature (F): 97.8 Height (in): 72 Pulse (bpm): 51 Weight (lbs): 260 Respiratory Rate (breaths/min): 16 Body Mass Index (BMI): 35.3 Blood Pressure (mmHg): 118/84 Capillary Blood Glucose (mg/dl): 128 Reference Range: 80 - 120 mg / dl Electronic Signature(s) Signed: 01/27/2022 11:58:20 AM By: [name] CHT EMT BS , , Entered By: [name] on 01/27/2022 11:57:23

## 2022-01-27 NOTE — Progress Notes (Signed)
EMERSEN, CARROLL ([id number]) Visit Report for 01/27/2022 HBO Details Patient Name: Date of Service: Andrew Estimable, [name] 01/27/2022 8:00 A M Medical Record Number: [id number] Patient Account Number: [id number] Date of Birth/Sex: Treating RN: 03-Aug-1956 (66 y.o. [name] Primary Care Stephanieann Popescu: [name] Other Clinician: [name] Referring Jameika Kinn: Treating Daquan Crapps/Extender: [name] in Treatment: 2 HBO Treatment Course Details Treatment Course Number: 1 Ordering Nyrah Demos: [name] T Treatments Ordered: otal 40 HBO Treatment Start Date: 01/20/2022 HBO Indication: Late Effect of Radiation HBO Treatment Details Treatment Number: 6 Patient Type: Outpatient Chamber Type: Monoplace Chamber Serial #: [id number] Treatment Protocol: 2.5 ATA with 90 minutes oxygen, with two 5 minute air breaks Treatment Details Compression Rate Down: 1.0 psi / minute De-Compression Rate Up: 1.5 psi / minute A breaks and breathing ir Compress Tx Pressure periods Decompress Decompress Begins Reached (leave unused spaces Begins Ends blank) Chamber Pressure (ATA 1 2.5 2.5 2.5 2.5 2.5 - - 2.5 1 ) Clock Time (24 hr) 08:39 09:02 09:32 09:37 10:07 10:12 - - 10:42 10:59 Treatment Length: 140 (minutes) Treatment Segments: 5 Vital Signs Capillary Blood Glucose Reference Range: 80 - 120 mg / dl HBO Diabetic Blood Glucose Intervention Range: <131 mg/dl or >249 mg/dl Time Vitals Blood Respiratory Capillary Blood Glucose Pulse Action Type: Pulse: Temperature: Taken: Pressure: Rate: Glucose (mg/dl): Meter #: Oximetry (%) Taken: Pre 11:02 118/84 51 16 97.8 128 2 Post 11:02 118/84 51 16 97.8 158 2 Pre 08:34 159 2 Treatment Response Treatment Toleration: Well Treatment Completion Status: Treatment Completed without Adverse Event Treatment Notes Patient placed in chamber after completing safety check. Compressed chamber at 1 psi/min until reaching  2.0 ATA at which time compression increased to 1.25 psi/min. Post-treatment, chamber was decompressed at a rate of 1.25 psi/min until reaching 7 psi at which time decompression rate was decreased to 1 psi/min. Patient stated he felt good after treatment. Physician HBO Attestation: I certify that I supervised this HBO treatment in accordance with Medicare guidelines. A trained emergency response team is readily available per Yes hospital policies and procedures. Continue HBOT as ordered. Yes Electronic Signature(s) Signed: 01/27/2022 3:57:27 PM By: [name] DO Previous Signature: 01/27/2022 11:58:20 AM Version By: [name] CHT EMT BS , , Entered By: [name] on 01/27/2022 15:54:40 -------------------------------------------------------------------------------- HBO Safety Checklist Details Patient Name: Date of Service: Andrew Estimable, [name] 01/27/2022 8:00 A M Medical Record Number: [id number] Patient Account Number: [id number] Date of Birth/Sex: Treating RN: 09/04/1956 (66 y.o. [name] Primary Care Nataliee Shurtz: [name] Other Clinician: [name] Referring Gevin Perea: Treating Deion Forgue/Extender: [name] in Treatment: 2 HBO Safety Checklist Items Safety Checklist Consent Form Signed Patient voided / foley secured and emptied When did you last eato 0630 Last dose of injectable or oral agent Yesterday Ostomy pouch emptied and vented if applicable NA All implantable devices assessed, documented and approved NA Intravenous access site secured and place NA Valuables secured Linens and cotton and cotton/polyester blend (less than 51% polyester) Personal oil-based products / skin lotions / body lotions removed NA Wigs or hairpieces removed NA Smoking or tobacco materials removed NA Books / newspapers / magazines / loose paper removed Cologne, aftershave, perfume and deodorant removed Jewelry removed (may  wrap wedding band) Make-up removed NA Hair care products removed Battery operated devices (external) removed Heating patches and chemical warmers removed Titanium eyewear removed Not wearing eyewear in chamber (titanium) Nail polish cured greater than 10 hours  NA Casting material cured greater than 10 hours NA Hearing aids removed NA Loose dentures or partials removed NA Prosthetics have been removed NA Patient demonstrates correct use of air break device (if applicable) Patient concerns have been addressed Patient grounding bracelet on and cord attached to chamber Specifics for Inpatients (complete in addition to above) Medication sheet sent with patient NA Intravenous medications needed or due during therapy sent with patient NA Drainage tubes (e.g. nasogastric tube or chest tube secured and vented) NA Endotracheal or Tracheotomy tube secured NA Cuff deflated of air and inflated with saline NA Airway suctioned NA Notes Paper version used prior to treatment start. Electronic Signature(s) Signed: 01/27/2022 11:58:20 AM By: [name] CHT EMT BS , , Entered By: [name] on 01/27/2022 11:52:51

## 2022-01-28 ENCOUNTER — Encounter (HOSPITAL_BASED_OUTPATIENT_CLINIC_OR_DEPARTMENT_OTHER): Payer: 59 | Admitting: Internal Medicine

## 2022-01-28 DIAGNOSIS — N3041 Irradiation cystitis with hematuria: Secondary | ICD-10-CM | POA: Diagnosis not present

## 2022-01-28 LAB — GLUCOSE, CAPILLARY
Glucose-Capillary: 141 mg/dL — ABNORMAL HIGH (ref 70–99)
Glucose-Capillary: 142 mg/dL — ABNORMAL HIGH (ref 70–99)

## 2022-01-28 NOTE — Progress Notes (Signed)
Andrew [name], Andrew [name] ([id number]) Visit Report for 01/27/2022 SuperBill Details Patient Name: Date of Service: [name], DO [place] LD E. 01/27/2022 Medical Record Number: [id number] Patient Account Number: [id number] Date of Birth/Sex: Treating RN: 01/23/1956 (66 y.o. 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 E11.69 Type 2 diabetes mellitus with other specified complication Facility Procedures CPT4 Code Description Modifier Quantity [id number] G0277-(Facility Use Only) HBOT full body chamber, 64min , 5 ICD-10 Diagnosis Description C61 Malignant neoplasm of prostate N30.41 Irradiation cystitis with hematuria E11.69 Type 2 diabetes mellitus with other specified complication Physician Procedures Quantity CPT4 Code Description Modifier [id number] [place] - WC PHYS HYPERBARIC OXYGEN THERAPY 1 ICD-10 Diagnosis Description C61 Malignant neoplasm of prostate N30.41 Irradiation cystitis with hematuria E11.69 Type 2 diabetes mellitus with other specified complication Electronic Signature(s) Signed: 01/28/2022 11:38:53 AM By: [name] DO Signed: 01/28/2022 11:46:36 AM By: [name] CHT EMT BS , , Entered By: [name] on 01/28/2022 11:04:20

## 2022-01-28 NOTE — Progress Notes (Signed)
SAN, LOHMEYER ([id number]) Visit Report for 01/28/2022 SuperBill Details Patient Name: Date of Service: [name], DO [place] LD E. 01/28/2022 Medical Record Number: [id number] Patient Account Number: [id number] Date of Birth/Sex: Treating RN: 16-Aug-1956 (66 y.o. [name] Primary Care Provider: [name] Other Clinician: [name] Referring Provider: Treating Provider/Extender: [name] in Treatment: 2 Diagnosis Coding ICD-10 Codes Code Description C61 Malignant neoplasm of prostate N30.41 Irradiation cystitis with hematuria E11.69 Type 2 diabetes mellitus with other specified complication Facility Procedures CPT4 Code Description Modifier Quantity [id number] G0277-(Facility Use Only) HBOT full body chamber, 80min , 5 ICD-10 Diagnosis Description C61 Malignant neoplasm of prostate N30.41 Irradiation cystitis with hematuria E11.69 Type 2 diabetes mellitus with other specified complication Physician Procedures Quantity CPT4 Code Description Modifier [id number] 85885 - WC PHYS HYPERBARIC OXYGEN THERAPY 1 ICD-10 Diagnosis Description C61 Malignant neoplasm of prostate N30.41 Irradiation cystitis with hematuria E11.69 Type 2 diabetes mellitus with other specified complication Electronic Signature(s) Signed: 01/28/2022 11:46:36 AM By: [name] CHT EMT BS , , Signed: 01/28/2022 5:02:53 PM By: Linton Ham MD Entered By: [name] on 01/28/2022 11:09:56

## 2022-01-28 NOTE — Progress Notes (Signed)
DYMOND, SPREEN ([id number]) Visit Report for 01/28/2022 Arrival Information Details Patient Name: Date of Service: Andrew Estimable, DO [place] LD E. 01/28/2022 8:00 A M Medical Record Number: [id number] Patient Account Number: [id number] Date of Birth/Sex: Treating RN: 1956/02/08 (66 y.o. [name], [name] Primary Care Sie Formisano: [name] Other Clinician: [name] Referring Ameliyah Sarno: Treating Cherisa Brucker/Extender: [name] in Treatment: 2 Visit Information History Since Last Visit All ordered tests and consults were completed: Yes Patient Arrived: Ambulatory Added or deleted any medications: No Arrival Time: 07:50 Any new allergies or adverse reactions: No Accompanied By: self Had a fall or experienced change in No Transfer Assistance: None activities of daily living that may affect Patient Identification Verified: Yes risk of falls: Secondary Verification Process Completed: Yes Signs or symptoms of abuse/neglect since last visito No Patient Requires Transmission-Based Precautions: No Hospitalized since last visit: No Patient Has Alerts: No Implantable device outside of the clinic excluding No cellular tissue based products placed in the center since last visit: Pain Present Now: No Electronic Signature(s) Signed: 01/28/2022 11:46:36 AM By: [name] CHT EMT BS , , Entered By: [name] on 01/28/2022 85:88:50 -------------------------------------------------------------------------------- Encounter Discharge Information Details Patient Name: Date of Service: Andrew Estimable, [name] 01/28/2022 8:00 A M Medical Record Number: [id number] Patient Account Number: [id number] Date of Birth/Sex: Treating RN: February 18, 1956 (66 y.o. [name] Primary Care Samantha Olivera: [name] Other Clinician: [name] Referring Abdel Effinger: Treating Leven Hoel/Extender: [name] in Treatment: 2 Encounter  Discharge Information Items Discharge Condition: Stable Ambulatory Status: Ambulatory Discharge Destination: Home Transportation: Private Auto Accompanied By: self Schedule Follow-up Appointment: No Clinical Summary of Care: Electronic Signature(s) Signed: 01/28/2022 11:46:36 AM By: [name] CHT EMT BS , , Entered By: [name] on 01/28/2022 11:13:57 -------------------------------------------------------------------------------- Vitals Details Patient Name: Date of Service: Andrew Estimable, [name] 01/28/2022 8:00 A M Medical Record Number: [id number] Patient Account Number: [id number] Date of Birth/Sex: Treating RN: [birth year] (66 y.o. [name] Primary Care Sandon Yoho: [name] Other Clinician: [name] Referring Omar Gayden: Treating Kylle Lall/Extender: [name] in Treatment: 2 Vital Signs Time Taken: 08:00 Temperature (F): 98.4 Height (in): 72 Pulse (bpm): 79 Weight (lbs): 260 Respiratory Rate (breaths/min): 18 Body Mass Index (BMI): 35.3 Blood Pressure (mmHg): 142/85 Capillary Blood Glucose (mg/dl): 141 Reference Range: 80 - 120 mg / dl Electronic Signature(s) Signed: 01/28/2022 11:46:36 AM By: [name] CHT EMT BS , , Entered By: [name] on 01/28/2022 08:30:49

## 2022-01-28 NOTE — Progress Notes (Signed)
KEIDAN, AUMILLER ([id number]) Visit Report for 01/28/2022 HBO Details Patient Name: Date of Service: [name], [name] 01/28/2022 8:00 A M Medical Record Number: [id number] Patient Account Number: [id number] Date of Birth/Sex: Treating RN: May 27, 1956 (66 y.o. Andrew [name] Primary Care [name]: [name] Other Clinician: [name] Referring [name]: Treating [name] in Treatment: 2 HBO Treatment Course Details Treatment Course Number: 1 Ordering Shardae Kleinman: [name] T Treatments Ordered: otal 40 HBO Treatment Start Date: 01/20/2022 HBO Indication: Late Effect of Radiation HBO Treatment Details Treatment Number: 7 Patient Type: Outpatient Chamber Type: Monoplace Chamber Serial #: [id number] Treatment Protocol: 2.5 ATA with 90 minutes oxygen, with two 5 minute air breaks Treatment Details Compression Rate Down: 1.0 psi / minute De-Compression Rate Up: 1.5 psi / minute A breaks and breathing ir Compress Tx Pressure periods Decompress Decompress Begins Reached (leave unused spaces Begins Ends blank) Chamber Pressure (ATA 1 2.5 2.5 2.5 2.5 2.5 - - 2.5 1 ) Clock Time (24 hr) 08:07 08:28 08:58 09:03 09:37 09:42 - - 10:08 10:23 Treatment Length: 136 (minutes) Treatment Segments: 5 Vital Signs Capillary Blood Glucose Reference Range: 80 - 120 mg / dl HBO Diabetic Blood Glucose Intervention Range: <131 mg/dl or >249 mg/dl Time Vitals Blood Respiratory Capillary Blood Glucose Pulse Action Type: Pulse: Temperature: Taken: Pressure: Rate: Glucose (mg/dl): Meter #: Oximetry (%) Taken: Pre 08:00 142/85 79 18 98.4 141 2 Post 10:27 116/83 57 18 97.9 142 2 Treatment Response Treatment Toleration: Well Treatment Completion Status: Treatment Completed without Adverse Event Treatment Notes Patient placed in chamber after completing safety check. Compressed chamber at 1 psi/min until reaching 7 psi at which  point compression rate was increased to 1.25 psi/min. Upon reaching 15.5 psi compression rate was increased to 1.5 psi/min. Patient was monitored during the entirety of the compression of the chamber. Patient appears to understand to have the technician stop travel if he cannot equalize pressure in middle ear(s). Average compression rate was 1.05 psi/min. During decompression, travel rate was 1.5 until reaching 3 psi at which point travel rate was lowered to 1.0 psi/min as patient has experienced difficulty equalizing pressure in ears at that level. Patient had no problems, stated that treatment went fine. Chae Oommen Notes No concerns with treatment given Physician HBO Attestation: I certify that I supervised this HBO treatment in accordance with Medicare guidelines. A trained emergency response team is readily available per Yes hospital policies and procedures. Continue HBOT as ordered. Yes Electronic Signature(s) Signed: 01/28/2022 5:02:53 PM By: Linton Ham MD Previous Signature: 01/28/2022 11:46:36 AM Version By: [name] CHT EMT BS , , Entered By: Linton Ham on 01/28/2022 16:59:00 -------------------------------------------------------------------------------- HBO Safety Checklist Details Patient Name: Date of Service: [name], [name] 01/28/2022 8:00 A M Medical Record Number: [id number] Patient Account Number: [id number] Date of Birth/Sex: Treating RN: [birth year] (66 y.o. Andrew [name], Andrew [name] Primary Care [name]: [name] Other Clinician: [name] Referring [name]: Treating [name]/Extender: [name] in Treatment: 2 HBO Safety Checklist Items Safety Checklist Consent Form Signed Patient voided / foley secured and emptied When did you last eato 0630 Last dose of injectable or oral agent Yesterday after HBO Tx Ostomy pouch emptied and vented if applicable NA All implantable devices assessed, documented and  approved NA Intravenous access site secured and place NA Valuables secured Linens and cotton and cotton/polyester blend (less than 51% polyester) Personal oil-based products / skin lotions / body lotions removed Wigs or  hairpieces removed NA Smoking or tobacco materials removed NA Books / newspapers / magazines / loose paper removed Cologne, aftershave, perfume and deodorant removed Jewelry removed (may wrap wedding band) Make-up removed NA Hair care products removed Battery operated devices (external) removed Heating patches and chemical warmers removed Titanium eyewear removed Not wearing eyewear in chamber (titanium) Nail polish cured greater than 10 hours NA Casting material cured greater than 10 hours NA Hearing aids removed NA Loose dentures or partials removed NA Prosthetics have been removed NA Patient demonstrates correct use of air break device (if applicable) Patient concerns have been addressed Patient grounding bracelet on and cord attached to chamber Specifics for Inpatients (complete in addition to above) Medication sheet sent with patient NA Intravenous medications needed or due during therapy sent with patient NA Drainage tubes (e.g. nasogastric tube or chest tube secured and vented) NA Endotracheal or Tracheotomy tube secured NA Cuff deflated of air and inflated with saline NA Airway suctioned NA Notes Paper version used prior to treatment. Electronic Signature(s) Signed: 01/28/2022 11:46:36 AM By: [name] CHT EMT BS , , Entered By: [name] on 01/28/2022 08:32:09

## 2022-01-29 ENCOUNTER — Encounter (HOSPITAL_BASED_OUTPATIENT_CLINIC_OR_DEPARTMENT_OTHER): Payer: 59 | Admitting: Physician Assistant

## 2022-01-29 ENCOUNTER — Other Ambulatory Visit: Payer: Self-pay

## 2022-01-29 DIAGNOSIS — N3041 Irradiation cystitis with hematuria: Secondary | ICD-10-CM | POA: Diagnosis not present

## 2022-01-29 LAB — GLUCOSE, CAPILLARY
Glucose-Capillary: 145 mg/dL — ABNORMAL HIGH (ref 70–99)
Glucose-Capillary: 159 mg/dL — ABNORMAL HIGH (ref 70–99)

## 2022-01-29 NOTE — Progress Notes (Signed)
EZRI, LANDERS ([id number]) Visit Report for 01/29/2022 SuperBill Details Patient Name: Date of Service: [name], [name] 01/29/2022 Medical Record Number: [id number] Patient Account Number: [id number] Date of Birth/Sex: Treating RN: [birth year] (66 y.o. [name] Primary Care Provider: [name] Other Clinician: [name] Referring Provider: Treating Provider/Extender: Marrianne Mood in Treatment: 2 Diagnosis Coding ICD-10 Codes Code Description N30.41 Irradiation cystitis with hematuria C61 Malignant neoplasm of prostate E11.69 Type 2 diabetes mellitus with other specified complication Facility Procedures CPT4 Code Description Modifier Quantity [id number] G0277-(Facility Use Only) HBOT full body chamber, 39min , 4 ICD-10 Diagnosis Description N30.41 Irradiation cystitis with hematuria C61 Malignant neoplasm of prostate Physician Procedures Quantity CPT4 Code Description Modifier [id number] 00349 - WC PHYS HYPERBARIC OXYGEN THERAPY 1 ICD-10 Diagnosis Description N30.41 Irradiation cystitis with hematuria C61 Malignant neoplasm of prostate Electronic Signature(s) Signed: 01/29/2022 5:11:08 PM By: Worthy Keeler PA-C Signed: 01/29/2022 6:19:37 PM By: [name] CHT EMT BS , , Entered By: [name] on 01/29/2022 10:37:44

## 2022-01-29 NOTE — Progress Notes (Signed)
Andrew [name] ([id number]) Visit Report for 01/29/2022 HBO Details Patient Name: Date of Service: Andrew Estimable, [name] 01/29/2022 8:00 A M Medical Record Number: [id number] Patient Account Number: [id number] Date of Birth/Sex: Treating RN: 11/12/1956 (66 y.o. Andrew [name] Primary Care Andrew [name]: Andrew [name] Other Clinician: Donavan [name] Referring Andrew [name]: Treating Andrew [name]/Extender: Andrew [name] in Treatment: 2 HBO Treatment Course Details Treatment Course Number: 1 Ordering Andrew [name]: Andrew [name] T Treatments Ordered: otal 40 HBO Treatment Start Date: 01/20/2022 HBO Indication: Late Effect of Radiation HBO Treatment Details Treatment Number: 8 Patient Type: Outpatient Chamber Type: Monoplace Chamber Serial #: [id number] Treatment Protocol: 2.5 ATA with 90 minutes oxygen, with two 5 minute air breaks Treatment Details Compression Rate Down: 1.5 psi / minute De-Compression Rate Up: 1.5 psi / minute A breaks and breathing ir Compress Tx Pressure periods Decompress Decompress Begins Reached (leave unused spaces Begins Ends blank) Chamber Pressure (ATA 1 2.5 2.5 2.5 2.5 2.5 - - 2.5 1 ) Clock Time (24 hr) 08:06 08:23 08:53 08:58 09:28 09:33 - - 10:03 10:20 Treatment Length: 134 (minutes) Treatment Segments: 4 Vital Signs Capillary Blood Glucose Reference Range: 80 - 120 mg / dl HBO Diabetic Blood Glucose Intervention Range: <131 mg/dl or >249 mg/dl Time Vitals Blood Respiratory Capillary Blood Glucose Pulse Action Type: Pulse: Temperature: Taken: Pressure: Rate: Glucose (mg/dl): Meter #: Oximetry (%) Taken: Pre 08:00 126/82 66 18 98 145 2 Post 10:23 114/69 56 18 97.9 159 2 Treatment Response Treatment Toleration: Well Treatment Completion Status: Treatment Completed without Adverse Event Treatment Notes Patient placed in chamber after performing safety check. Patient traveled at 1.5 psi/min during compression and  decompression of chamber. Patient alerted to stop travel at 13 psi and 3 psi. No issues reported. Patient understands to stop travel if ears don't equalize. Physician HBO Attestation: I certify that I supervised this HBO treatment in accordance with Medicare guidelines. A trained emergency response team is readily available per Yes hospital policies and procedures. Continue HBOT as ordered. Yes Electronic Signature(s) Signed: 01/29/2022 5:08:18 PM By: Worthy Keeler PA-C Entered By: Worthy Keeler on 01/29/2022 17:08:18 -------------------------------------------------------------------------------- HBO Safety Checklist Details Patient Name: Date of Service: Andrew Estimable, [name] 01/29/2022 8:00 A M Medical Record Number: [id number] Patient Account Number: [id number] Date of Birth/Sex: Treating RN: Nov 28, 1956 (66 y.o. Andrew [name] Primary Care [name]: Andrew [name] Other Clinician: Donavan [name] Referring Kaitlin Alcindor: Treating Elize Pinon/Extender: Andrew [name] in Treatment: 2 HBO Safety Checklist Items Safety Checklist Consent Form Signed Patient voided / foley secured and emptied When did you last eato 0630 Last dose of injectable or oral agent Yesterday after HBO Tx Ostomy pouch emptied and vented if applicable NA All implantable devices assessed, documented and approved NA Intravenous access site secured and place NA Valuables secured Linens and cotton and cotton/polyester blend (less than 51% polyester) Personal oil-based products / skin lotions / body lotions removed Wigs or hairpieces removed NA Smoking or tobacco materials removed NA Books / newspapers / magazines / loose paper removed Cologne, aftershave, perfume and deodorant removed Jewelry removed (may wrap wedding band) Make-up removed Hair care products removed Battery operated devices (external) removed Heating patches and chemical warmers removed Titanium eyewear  removed NA Nail polish cured greater than 10 hours NA Casting material cured greater than 10 hours NA Hearing aids removed NA Loose dentures or partials removed NA Prosthetics have been removed NA Patient demonstrates correct use of air break device (  if applicable) Patient concerns have been addressed Patient grounding bracelet on and cord attached to chamber Specifics for Inpatients (complete in addition to above) Medication sheet sent with patient NA Intravenous medications needed or due during therapy sent with patient NA Drainage tubes (e.g. nasogastric tube or chest tube secured and vented) NA Endotracheal or Tracheotomy tube secured NA Cuff deflated of air and inflated with saline NA Airway suctioned NA Notes Paper version used prior to treatment. Electronic Signature(s) Signed: 01/29/2022 6:19:37 PM By: Andrew [name] CHT EMT BS , , Entered By: Andrew [name] on 01/29/2022 10:42:48

## 2022-01-29 NOTE — Progress Notes (Addendum)
CAVION, FAIOLA ([id number]) Visit Report for 01/29/2022 Arrival Information Details Patient Name: Date of Service: [name], DO [place] LD E. 01/29/2022 8:00 A M Medical Record Number: [id number] Patient Account Number: [id number] Date of Birth/Sex: Treating RN: 02-12-1956 (66 y.o. [name] Primary Care Shaquala Broeker: [name] Other Clinician: [name] Referring Treyshawn Muldrew: Treating Kit Mollett/Extender: Marrianne Mood in Treatment: 2 Visit Information History Since Last Visit All ordered tests and consults were completed: Yes Patient Arrived: Ambulatory Added or deleted any medications: No Arrival Time: 08:00 Any new allergies or adverse reactions: No Accompanied By: self Had a fall or experienced change in No Transfer Assistance: None activities of daily living that may affect Patient Identification Verified: Yes risk of falls: Secondary Verification Process Completed: Yes Signs or symptoms of abuse/neglect since last visito No Patient Requires Transmission-Based Precautions: No Hospitalized since last visit: No Patient Has Alerts: No Implantable device outside of the clinic excluding No cellular tissue based products placed in the center since last visit: Pain Present Now: No Electronic Signature(s) Signed: 01/29/2022 6:19:37 PM By: [name] CHT EMT BS , , Previous Signature: 01/29/2022 8:58:44 AM Version By: [name] CHT EMT BS , , Entered By: [name] on 01/29/2022 10:33:49 -------------------------------------------------------------------------------- Encounter Discharge Information Details Patient Name: Date of Service: [name], [name] 01/29/2022 8:00 A M Medical Record Number: [id number] Patient Account Number: [id number] Date of Birth/Sex: Treating RN: Aug 28, 1956 (66 y.o. [name] Primary Care Reco Shonk: [name] Other Clinician: [name] Referring Alyze Lauf: Treating  Nehal Shives/Extender: Marrianne Mood in Treatment: 2 Encounter Discharge Information Items Discharge Condition: Stable Ambulatory Status: Ambulatory Discharge Destination: Home Transportation: Private Auto Accompanied By: self Schedule Follow-up Appointment: No Clinical Summary of Care: Electronic Signature(s) Signed: 01/29/2022 6:19:37 PM By: [name] CHT EMT BS , , Entered By: [name] on 01/29/2022 10:38:13 -------------------------------------------------------------------------------- Vitals Details Patient Name: Date of Service: [name], [name] 01/29/2022 8:00 A M Medical Record Number: [id number] Patient Account Number: [id number] Date of Birth/Sex: Treating RN: 1956/01/23 (66 y.o. [name] Primary Care Trenna Kiely: [name] Other Clinician: [name] Referring Joscelynn Brutus: Treating Carrine Kroboth/Extender: Marrianne Mood in Treatment: 2 Vital Signs Time Taken: 08:00 Temperature (F): 98.0 Height (in): 72 Pulse (bpm): 66 Weight (lbs): 260 Respiratory Rate (breaths/min): 18 Body Mass Index (BMI): 35.3 Blood Pressure (mmHg): 126/82 Capillary Blood Glucose (mg/dl): 145 Reference Range: 80 - 120 mg / dl Electronic Signature(s) Signed: 01/29/2022 8:58:44 AM By: [name] CHT EMT BS , , Entered By: [name] on 01/29/2022 08:27:17

## 2022-01-30 ENCOUNTER — Encounter (HOSPITAL_BASED_OUTPATIENT_CLINIC_OR_DEPARTMENT_OTHER): Payer: 59 | Admitting: Internal Medicine

## 2022-01-30 DIAGNOSIS — N3041 Irradiation cystitis with hematuria: Secondary | ICD-10-CM | POA: Diagnosis not present

## 2022-01-30 LAB — GLUCOSE, CAPILLARY
Glucose-Capillary: 127 mg/dL — ABNORMAL HIGH (ref 70–99)
Glucose-Capillary: 156 mg/dL — ABNORMAL HIGH (ref 70–99)
Glucose-Capillary: 148 mg/dL — ABNORMAL HIGH (ref 70–99)

## 2022-01-30 NOTE — Progress Notes (Addendum)
Andrew [name], HUN ([id number]) Visit Report for 01/30/2022 HBO Details Patient Name: Date of Service: Andrew Estimable, [name] 01/30/2022 8:00 A M Medical Record Number: [id number] Patient Account Number: [id number] Date of Birth/Sex: Treating RN: January 18, 1956 (66 y.o. Andrew [name] Primary Care Keryl Gholson: [name] Other Clinician: [name] Referring Gerald Honea: Treating Teyana Pierron/Extender: [name] in Treatment: 2 HBO Treatment Course Details Treatment Course Number: 1 Ordering Aideliz Garmany: [name] T Treatments Ordered: otal 40 HBO Treatment Start Date: 01/20/2022 HBO Indication: Late Effect of Radiation HBO Treatment Details Treatment Number: 9 Patient Type: Outpatient Chamber Type: Monoplace Chamber Serial #: [id number] Treatment Protocol: 2.5 ATA with 90 minutes oxygen, with two 5 minute air breaks Treatment Details Compression Rate Down: 1.0 psi / minute De-Compression Rate Up: 1.0 psi / minute A breaks and breathing ir Compress Tx Pressure periods Decompress Decompress Begins Reached (leave unused spaces Begins Ends blank) Chamber Pressure (ATA 1 2.5 2.5 2.5 2.5 2.5 - - 2.5 1 ) Clock Time (24 hr) 08:41 09:01 09:31 09:36 10:06 10:11 - - 10:41 11:08 Treatment Length: 147 (minutes) Treatment Segments: 5 Vital Signs Capillary Blood Glucose Reference Range: 80 - 120 mg / dl HBO Diabetic Blood Glucose Intervention Range: <131 mg/dl or >249 mg/dl Type: Time Vitals Blood Pulse: Respiratory Temperature: Capillary Blood Glucose Pulse Action Taken: Pressure: Rate: Glucose (mg/dl): Meter #: Oximetry (%) Taken: Pre 08:41 125/78 62 16 98.3 127 Patient giver 8oz of Glucerna to drink Post 11:12 105/80 57 16 97.8 148 Pre 156 30 min after drinking Glucerna Treatment Response Treatment Toleration: Well Treatment Completion Status: Treatment Completed without Adverse Event Treatment Notes Before treatment the patient CBG was 127, and was given  8oz of Glucerna to drink. His CBG was rechecked 30 min latter. His CBG was 156, and treatment was started. Dr. Claudie Fisherman was informed. Compression rate travel rate of 1.0 PSI/min, travel stopped at 5 PSI to clear his ears. He was able to clear his ears and compression to 2.5ATA with no other problems. No issues during treatment. Decompression rate of 1.0 PSI/Min he complained of ear pain @ 5 PSI, travel stopped until he was able to clear his ears. The patient had no complaints of ear pain after treatment and decompression. I asked him after changing if he had any ear pain. He stated no. Paper version used prior to treatment start. Thomos Domine Notes No concerns with treatment given Physician HBO Attestation: I certify that I supervised this HBO treatment in accordance with Medicare guidelines. A trained emergency response team is readily available per Yes hospital policies and procedures. Continue HBOT as ordered. Yes Electronic Signature(s) Signed: 01/30/2022 5:29:10 PM By: Linton Ham MD Previous Signature: 01/30/2022 1:17:53 PM Version By: [name] EMT Previous Signature: 01/30/2022 1:13:34 PM Version By: [name] EMT Entered By: Linton Ham on 01/30/2022 15:23:06 -------------------------------------------------------------------------------- HBO Safety Checklist Details Patient Name: Date of Service: Andrew Estimable, [name] 01/30/2022 8:00 A M Medical Record Number: [id number] Patient Account Number: [id number] Date of Birth/Sex: Treating RN: 09-10-1956 (66 y.o. Andrew [name] Primary Care Tarique Loveall: [name] Other Clinician: [name] Referring Karolyna Bianchini: Treating Scotlynn Noyes/Extender: [name] in Treatment: 2 HBO Safety Checklist Items Safety Checklist Consent Form Signed Patient voided / foley secured and emptied When did you last eato 0630 Last dose of injectable or oral agent takes medication after treatment Ostomy pouch  emptied and vented if applicable NA All implantable devices assessed, documented and approved NA Intravenous access site  secured and place NA Valuables secured Linens and cotton and cotton/polyester blend (less than 51% polyester) Personal oil-based products / skin lotions / body lotions removed Wigs or hairpieces removed NA Smoking or tobacco materials removed Books / newspapers / magazines / loose paper removed Cologne, aftershave, perfume and deodorant removed Jewelry removed (may wrap wedding band) NA Make-up removed NA Hair care products removed NA Battery operated devices (external) removed Heating patches and chemical warmers removed Titanium eyewear removed NA Nail polish cured greater than 10 hours NA Casting material cured greater than 10 hours NA Hearing aids removed NA Loose dentures or partials removed NA Prosthetics have been removed NA Patient demonstrates correct use of air break device (if applicable) Patient concerns have been addressed Patient grounding bracelet on and cord attached to chamber Specifics for Inpatients (complete in addition to above) Medication sheet sent with patient NA Intravenous medications needed or due during therapy sent with patient NA Drainage tubes (e.g. nasogastric tube or chest tube secured and vented) NA Endotracheal or Tracheotomy tube secured NA Cuff deflated of air and inflated with saline NA Airway suctioned NA Notes Paper version used prior to treatment start. Electronic Signature(s) Signed: 01/30/2022 9:34:13 AM By: [name] EMT Entered By: [name] on 01/30/2022 09:34:13

## 2022-01-30 NOTE — Progress Notes (Addendum)
CANON, GOLA ([id number]) Visit Report for 01/30/2022 Arrival Information Details Patient Name: Date of Service: Andrew Estimable, DO [place] LD E. 01/30/2022 8:00 A M Medical Record Number: [id number] Patient Account Number: [id number] Date of Birth/Sex: Treating RN: [birth year] (66 y.o. [name] Primary Care Romell Cavanah: [name] Other Clinician: [name] Referring Seward Coran: Treating Siri Buege/Extender: [name] in Treatment: 2 Visit Information History Since Last Visit All ordered tests and consults were completed: Yes Patient Arrived: Ambulatory Added or deleted any medications: No Arrival Time: 07:47 Any new allergies or adverse reactions: No Accompanied By: None Had a fall or experienced change in No Transfer Assistance: None activities of daily living that may affect Patient Identification Verified: Yes risk of falls: Secondary Verification Process Completed: Yes Signs or symptoms of abuse/neglect since last visito No Patient Requires Transmission-Based Precautions: No Hospitalized since last visit: No Patient Has Alerts: No Implantable device outside of the clinic excluding No cellular tissue based products placed in the center since last visit: Pain Present Now: No Notes Paper version used prior to treatment start. Electronic Signature(s) Signed: 01/30/2022 9:28:00 AM By: [name] EMT Entered By: [name] on 01/30/2022 09:28:00 -------------------------------------------------------------------------------- Encounter Discharge Information Details Patient Name: Date of Service: Andrew Estimable, [name] 01/30/2022 8:00 A M Medical Record Number: [id number] Patient Account Number: [id number] Date of Birth/Sex: Treating RN: 1956/06/26 (66 y.o. [name] Primary Care Saylah Ketner: [name] Other Clinician: [name] Referring Macen Joslin: Treating Gianella Chismar/Extender: [name] in  Treatment: 2 Encounter Discharge Information Items Discharge Condition: Stable Ambulatory Status: Ambulatory Discharge Destination: Home Transportation: Private Auto Accompanied By: None Schedule Follow-up Appointment: Yes Clinical Summary of Care: Notes The patient was asked if he had any ear pain, before discharge. He stated no. Electronic Signature(s) Signed: 01/30/2022 1:16:28 PM By: [name] EMT Previous Signature: 01/30/2022 1:15:58 PM Version By: [name] EMT Entered By: [name] on 01/30/2022 13:16:28 -------------------------------------------------------------------------------- Patient/Caregiver Education Details Patient Name: Date of Service: Andrew Estimable, [name] 2/9/2023andnbsp8:00 A M Medical Record Number: [id number] Patient Account Number: [id number] Date of Birth/Gender: Treating RN: October 27, 1956 (66 y.o. [name] Primary Care Physician: [name] Other Clinician: [name] Referring Physician: Treating Physician/Extender: [name] in Treatment: 2 Education Assessment Education Provided To: Patient Education Topics Provided Electronic Signature(s) Signed: 01/30/2022 1:23:56 PM By: [name] EMT Entered By: [name] on 01/30/2022 13:14:31 -------------------------------------------------------------------------------- Vitals Details Patient Name: Date of Service: Andrew Estimable, [name] 01/30/2022 8:00 A M Medical Record Number: [id number] Patient Account Number: [id number] Date of Birth/Sex: Treating RN: January 09, 1956 (66 y.o. [name] Primary Care Dalayna Lauter: [name] Other Clinician: [name] Referring Morayo Leven: Treating Lynanne Delgreco/Extender: [name] in Treatment: 2 Vital Signs Time Taken: 08:41 Temperature (F): 98.3 Height (in): 72 Pulse (bpm): 62 Weight (lbs): 260 Respiratory Rate (breaths/min): 16 Body Mass Index (BMI):  35.3 Blood Pressure (mmHg): 125/78 Capillary Blood Glucose (mg/dl): 127 Reference Range: 80 - 120 mg / dl Notes Paper version used prior to treatment start. The patient was given [place] @ [phone]. CBG rechecked @ 3299 CBG was 156 Electronic Signature(s) Signed: 01/30/2022 9:32:39 AM By: [name] EMT Entered By: [name] on 01/30/2022 09:32:39

## 2022-01-30 NOTE — Progress Notes (Signed)
COYLE, STORDAHL ([id number]) Visit Report for 01/30/2022 Problem List Details Patient Name: Date of Service: [name], [name] 01/30/2022 8:00 A M Medical Record Number: [id number] Patient Account Number: [id number] Date of Birth/Sex: Treating RN: [birth year] (66 y.o. 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 01/30/2022 Medical Record Number: [id number] Patient Account Number: [id number] Date of Birth/Sex: Treating RN: Aug 06, 1956 (66 y.o. [name] Primary Care Provider: [name] Other Clinician: [name] Referring Provider: Treating Provider/Extender: [name] in Treatment: 2 Diagnosis Coding ICD-10 Codes Code Description N30.41 Irradiation cystitis with hematuria C61 Malignant neoplasm of prostate E11.69 Type 2 diabetes mellitus with other specified complication Facility Procedures CPT4 Code: [id number] Description: G0277-(Facility Use Only) HBOT full body chamber, 7min , ICD-10 Diagnosis Description N30.41 Irradiation cystitis with hematuria C61 Malignant neoplasm of prostate E11.69 Type 2  diabetes mellitus with other specified complication Modifier: Quantity: 5 Physician Procedures : CPT4 Code Description Modifier [id number] 16010 - WC PHYS HYPERBARIC OXYGEN THERAPY ICD-10 Diagnosis Description N30.41 Irradiation cystitis with hematuria C61 Malignant neoplasm of prostate E11.69 Type 2 diabetes mellitus with other specified  complication Quantity: 1 Electronic Signature(s) Signed: 01/30/2022 1:14:00 PM By: [name] EMT Signed: 01/30/2022 5:29:10 PM By: Linton Ham MD Entered By: [name] on 01/30/2022 13:14:00

## 2022-01-31 ENCOUNTER — Encounter (HOSPITAL_BASED_OUTPATIENT_CLINIC_OR_DEPARTMENT_OTHER): Payer: 59 | Admitting: Internal Medicine

## 2022-01-31 ENCOUNTER — Other Ambulatory Visit: Payer: Self-pay

## 2022-01-31 DIAGNOSIS — N3041 Irradiation cystitis with hematuria: Secondary | ICD-10-CM

## 2022-01-31 DIAGNOSIS — C61 Malignant neoplasm of prostate: Secondary | ICD-10-CM | POA: Diagnosis not present

## 2022-01-31 LAB — GLUCOSE, CAPILLARY
Glucose-Capillary: 182 mg/dL — ABNORMAL HIGH (ref 70–99)
Glucose-Capillary: 154 mg/dL — ABNORMAL HIGH (ref 70–99)

## 2022-02-03 ENCOUNTER — Encounter (HOSPITAL_BASED_OUTPATIENT_CLINIC_OR_DEPARTMENT_OTHER): Payer: 59 | Admitting: Internal Medicine

## 2022-02-03 ENCOUNTER — Other Ambulatory Visit: Payer: Self-pay

## 2022-02-03 DIAGNOSIS — E1169 Type 2 diabetes mellitus with other specified complication: Secondary | ICD-10-CM

## 2022-02-03 DIAGNOSIS — C61 Malignant neoplasm of prostate: Secondary | ICD-10-CM | POA: Diagnosis not present

## 2022-02-03 DIAGNOSIS — N3041 Irradiation cystitis with hematuria: Secondary | ICD-10-CM

## 2022-02-03 LAB — GLUCOSE, CAPILLARY
Glucose-Capillary: 205 mg/dL — ABNORMAL HIGH (ref 70–99)
Glucose-Capillary: 195 mg/dL — ABNORMAL HIGH (ref 70–99)

## 2022-02-03 NOTE — Progress Notes (Addendum)
BUEL, MOLDER ([id number]) Visit Report for 02/03/2022 HBO Details Patient Name: Date of Service: Andrew Estimable, [name] 02/03/2022 8:00 A M Medical Record Number: [id number] Patient Account Number: [id number] Date of Birth/Sex: Treating RN: 11/05/1956 (66 y.o. Andrew [name] Primary Care Andrew [name]: Andrew [name] Other Clinician: Valeria [name] Referring Andrew [name]: Treating Andrew [name]/Extender: Andrew [name] in Treatment: 3 HBO Treatment Course Details Treatment Course Number: 1 Ordering Andrew [name]: Andrew [name] T Treatments Ordered: otal 40 HBO Treatment Start Date: 01/20/2022 HBO Indication: Late Effect of Radiation HBO Treatment Details Treatment Number: 11 Patient Type: Outpatient Chamber Type: Monoplace Chamber Serial #: [id number] Treatment Protocol: 2.5 ATA with 90 minutes oxygen, with two 5 minute air breaks Treatment Details Compression Rate Down: 1.5 psi / minute De-Compression Rate Up: 1.5 psi / minute A breaks and breathing ir Compress Tx Pressure periods Decompress Decompress Begins Reached (leave unused spaces Begins Ends blank) Chamber Pressure (ATA 1 2.5 2.5 2.5 2.5 2.5 - - 2.5 1 ) Clock Time (24 hr) 08:29 08:44 09:14 09:19 09:49 09:54 - - 10:24 10:40 Treatment Length: 131 (minutes) Treatment Segments: 4 Vital Signs Capillary Blood Glucose Reference Range: 80 - 120 mg / dl HBO Diabetic Blood Glucose Intervention Range: <131 mg/dl or >249 mg/dl Time Vitals Blood Respiratory Capillary Blood Glucose Pulse Action Type: Pulse: Temperature: Taken: Pressure: Rate: Glucose (mg/dl): Meter #: Oximetry (%) Taken: Pre 08:08 125/76 71 16 98.5 205 Post 10:45 121/78 59 16 97.9 195 Treatment Response Treatment Toleration: Well Treatment Completion Status: Treatment Completed without Adverse Event Treatment Notes Paper version used prior to treatment start. The patient did not have any problems with clearing his ears today. Physician  HBO Attestation: I certify that I supervised this HBO treatment in accordance with Medicare guidelines. A trained emergency response team is readily available per Yes hospital policies and procedures. Continue HBOT as ordered. Yes Electronic Signature(s) Signed: 02/03/2022 1:44:00 PM By: Andrew Shan DO Previous Signature: 02/03/2022 12:50:03 PM Version By: Andrew [name] EMT Entered By: Andrew [name] on 02/03/2022 12:58:59 -------------------------------------------------------------------------------- HBO Safety Checklist Details Patient Name: Date of Service: Andrew Estimable, [name] 02/03/2022 8:00 A M Medical Record Number: [id number] Patient Account Number: [id number] Date of Birth/Sex: Treating RN: September 09, 1956 (66 y.o. Andrew [name] Primary Care Andrew [name]: Andrew [name] Other Clinician: Valeria [name] Referring [name]: Treating Andrew [name]/Extender: Andrew [name] in Treatment: 3 HBO Safety Checklist Items Safety Checklist Consent Form Signed Patient voided / foley secured and emptied When did you last eato 0630 Last dose of injectable or oral agent Takes medication after treatment Ostomy pouch emptied and vented if applicable NA All implantable devices assessed, documented and approved NA Intravenous access site secured and place NA Valuables secured Linens and cotton and cotton/polyester blend (less than 51% polyester) Personal oil-based products / skin lotions / body lotions removed Wigs or hairpieces removed NA Smoking or tobacco materials removed Books / newspapers / magazines / loose paper removed Cologne, aftershave, perfume and deodorant removed Jewelry removed (may wrap wedding band) NA Make-up removed NA Hair care products removed Battery operated devices (external) removed Heating patches and chemical warmers removed Titanium eyewear removed NA Nail polish cured greater than 10 hours NA Casting material cured  greater than 10 hours NA Hearing aids removed NA Loose dentures or partials removed NA Prosthetics have been removed NA Patient demonstrates correct use of air break device (if applicable) Patient concerns have been addressed Patient grounding bracelet on and cord attached to chamber Specifics for  Inpatients (complete in addition to above) Medication sheet sent with patient NA Intravenous medications needed or due during therapy sent with patient NA Drainage tubes (e.g. nasogastric tube or chest tube secured and vented) NA Endotracheal or Tracheotomy tube secured NA Cuff deflated of air and inflated with saline NA Airway suctioned NA Notes Paper version used prior to treatment start. Electronic Signature(s) Signed: 02/03/2022 12:47:08 PM By: Andrew [name] EMT Entered By: Andrew [name] on 02/03/2022 12:47:07

## 2022-02-03 NOTE — Progress Notes (Signed)
DIXIE, JAFRI ([id number]) Visit Report for 01/31/2022 Arrival Information Details Patient Name: Date of Service: Andrew Estimable, DO [place] LD E. 01/31/2022 8:00 A M Medical Record Number: [id number] Patient Account Number: [id number] Date of Birth/Sex: Treating RN: Andrew [name] (66 y.o. Andrew [name], Andrew [name] Primary Care Andrew [name]: Andrew [name] Other Clinician: Donavan [name] Referring Andrew [name]: Treating Andrew [name]/Extender: Andrew [name] in Treatment: 2 Visit Information History Since Last Visit All ordered tests and consults were completed: Yes Patient Arrived: Ambulatory Added or deleted any medications: No Arrival Time: 07:49 Any new allergies or adverse reactions: No Accompanied By: self Had a fall or experienced change in No Transfer Assistance: None activities of daily living that may affect Patient Identification Verified: Yes risk of falls: Secondary Verification Process Completed: Yes Signs or symptoms of abuse/neglect since last visito No Patient Requires Transmission-Based Precautions: No Hospitalized since last visit: No Patient Has Alerts: No Implantable device outside of the clinic excluding No cellular tissue based products placed in the center since last visit: Pain Present Now: No Electronic Signature(s) Signed: 02/03/2022 3:54:17 PM By: Andrew [name] CHT EMT BS , , Entered By: Andrew [name] on 01/31/2022 09:27:Andrew -------------------------------------------------------------------------------- Encounter Discharge Information Details Patient Name: Date of Service: Andrew Estimable, [name] 01/31/2022 8:00 A M Medical Record Number: [id number] Patient Account Number: [id number] Date of Birth/Sex: Treating RN: Andrew [name] (Andrew [name] Primary Care Andrew [name]: Andrew [name] Other Clinician: Donavan [name] Referring Andrew [name]: Treating Sharlisa Hollifield/Extender: Andrew [name] in Treatment:  2 Encounter Discharge Information Items Discharge Condition: Stable Ambulatory Status: Ambulatory Discharge Destination: Home Transportation: Private Auto Accompanied By: self Schedule Follow-up Appointment: No Clinical Summary of Care: Electronic Signature(s) Signed: 02/03/2022 3:54:17 PM By: Andrew [name] CHT EMT BS , , Entered By: Andrew [name] on 01/31/2022 11:00:37 -------------------------------------------------------------------------------- Vitals Details Patient Name: Date of Service: Andrew Estimable, [name] 01/31/2022 8:00 A M Medical Record Number: [id number] Patient Account Number: [id number] Date of Birth/Sex: Treating RN: [name] [name] (Andrew [name] Primary Care Andrew [name]: Andrew [name] Other Clinician: Donavan [name] Referring Andrew [name]: Treating Andrew [name]/Extender: Andrew [name] in Treatment: 2 Vital Signs Time Taken: 07:53 Temperature (F): 98.2 Height (in): 72 Pulse (bpm): 60 Weight (lbs): 260 Respiratory Rate (breaths/min): 18 Body Mass Index (BMI): 35.3 Blood Pressure (mmHg): 128/78 Capillary Blood Glucose (mg/dl): 182 Reference Range: 80 - 120 mg / dl Electronic Signature(s) Signed: 02/03/2022 3:54:17 PM By: Andrew [name] CHT EMT BS , , Entered By: Andrew [name] on 01/31/2022 10:54:01

## 2022-02-03 NOTE — Progress Notes (Signed)
CARLESS, SLATTEN ([id number]) Visit Report for 01/31/2022 SuperBill Details Patient Name: Date of Service: [name], DO [place] LD E. 01/31/2022 Medical Record Number: [id number] Patient Account Number: [id number] Date of Birth/Sex: Treating RN: May 02, 1956 (66 y.o. [name] Primary Care Provider: [name] Other Clinician: [name] Referring Provider: Treating Provider/Extender: [name] in Treatment: 2 Diagnosis Coding ICD-10 Codes Code Description N30.41 Irradiation cystitis with hematuria C61 Malignant neoplasm of prostate E11.69 Type 2 diabetes mellitus with other specified complication Facility Procedures CPT4 Code Description Modifier Quantity [id number] G0277-(Facility Use Only) HBOT full body chamber, 46min , 5 ICD-10 Diagnosis Description N30.41 Irradiation cystitis with hematuria C61 Malignant neoplasm of prostate Physician Procedures Quantity CPT4 Code Description Modifier [id number] - WC PHYS HYPERBARIC OXYGEN THERAPY 1 ICD-10 Diagnosis Description N30.41 Irradiation cystitis with hematuria C61 Malignant neoplasm of prostate Electronic Signature(s) Signed: 01/31/2022 12:39:47 PM By: [name] DO Signed: 02/03/2022 3:54:17 PM By: [name] CHT EMT BS , , Entered By: [name] on 01/31/2022 10:56:53

## 2022-02-03 NOTE — Progress Notes (Addendum)
TREYLIN, BURTCH ([id number]) Visit Report for 02/03/2022 Arrival Information Details Patient Name: Date of Service: [name], DO [place] LD E. 02/03/2022 8:00 A M Medical Record Number: [id number] Patient Account Number: [id number] Date of Birth/Sex: Treating RN: 02/21/1956 (66 y.o. [name] Primary Care Esmond Hinch: [name] Other Clinician: [name] Referring Tyriana Helmkamp: Treating Clarice Zulauf/Extender: [name] in Treatment: 3 Visit Information History Since Last Visit All ordered tests and consults were completed: Yes Patient Arrived: Ambulatory Added or deleted any medications: No Arrival Time: 07:50 Any new allergies or adverse reactions: No Accompanied By: None Had a fall or experienced change in No Transfer Assistance: None activities of daily living that may affect Patient Identification Verified: Yes risk of falls: Secondary Verification Process Completed: Yes Signs or symptoms of abuse/neglect since last visito No Patient Requires Transmission-Based Precautions: No Hospitalized since last visit: No Patient Has Alerts: No Implantable device outside of the clinic excluding No cellular tissue based products placed in the center since last visit: Pain Present Now: No Notes Paper version used prior to treatment start. Electronic Signature(s) Signed: 02/03/2022 12:44:35 PM By: [name] EMT Entered By: [name] on 02/03/2022 12:44:35 -------------------------------------------------------------------------------- Encounter Discharge Information Details Patient Name: Date of Service: [name], [name] 02/03/2022 8:00 A M Medical Record Number: [id number] Patient Account Number: [id number] Date of Birth/Sex: Treating RN: [birth year] (66 y.o. [name] Primary Care Brihanna Devenport: [name] Other Clinician: [name] Referring Rawley Harju: Treating Pearla Mckinny/Extender: [name] in Treatment: 3 Encounter Discharge Information Items Discharge Condition: Stable Ambulatory Status: Ambulatory Discharge Destination: Home Transportation: Private Auto Accompanied By: None Schedule Follow-up Appointment: No Clinical Summary of Care: Electronic Signature(s) Signed: 02/03/2022 12:51:36 PM By: [name] EMT Entered By: [name] on 02/03/2022 12:51:36 -------------------------------------------------------------------------------- Patient/Caregiver Education Details Patient Name: Date of Service: [name], [name] 2/13/2023andnbsp8:00 A M Medical Record Number: [id number] Patient Account Number: [id number] Date of Birth/Gender: Treating RN: [birth year] (65 y.o. [name] Primary Care Physician: [name] Other Clinician: [name] Referring Physician: Treating Physician/Extender: [name] in Treatment: 3 Education Assessment Education Provided To: Patient Education Topics Provided Electronic Signature(s) Signed: 02/03/2022 3:47:27 PM By: [name] EMT Entered By: [name] on 02/03/2022 12:51:15 -------------------------------------------------------------------------------- Vitals Details Patient Name: Date of Service: [name], [name] 02/03/2022 8:00 A M Medical Record Number: [id number] Patient Account Number: [id number] Date of Birth/Sex: Treating RN: 11/02/1956 (66 y.o. [name] Primary Care Eivin Mascio: [name] Other Clinician: [name] Referring Debany Vantol: Treating Jacquiline Zurcher/Extender: [name] in Treatment: 3 Vital Signs Time Taken: 08:08 Temperature (F): 98.5 Height (in): 72 Pulse (bpm): 71 Weight (lbs): 260 Respiratory Rate (breaths/min): 16 Body Mass Index (BMI): 35.3 Blood Pressure (mmHg): 125/76 Capillary Blood Glucose (mg/dl): 205 Reference Range: 80 - 120 mg / dl Notes Paper version used  prior to treatment start. Electronic Signature(s) Signed: 02/03/2022 12:45:20 PM By: [name] EMT Entered By: [name] on 02/03/2022 12:45:20

## 2022-02-03 NOTE — Progress Notes (Signed)
KIRIL, HIPPE ([id number]) Visit Report for 02/03/2022 Problem List Details Patient Name: Date of Service: [name], [name] 02/03/2022 8:00 A M Medical Record Number: [id number] Patient Account Number: [id number] Date of Birth/Sex: Treating RN: [birth year] (66 y.o. 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 02/03/2022 Medical Record Number: [id number] Patient Account Number: [id number] Date of Birth/Sex: Treating RN: 1956-08-19 (66 y.o. [name] Primary Care Provider: [name] Other Clinician: [name] Referring Provider: Treating Provider/Extender: [name] in Treatment: 3 Diagnosis Coding ICD-10 Codes Code Description N30.41 Irradiation cystitis with hematuria C61 Malignant neoplasm of prostate E11.69 Type 2 diabetes mellitus with other specified complication Facility Procedures CPT4 Code: [id number] Description: G0277-(Facility Use Only) HBOT full body chamber, 31min , ICD-10 Diagnosis Description N30.41 Irradiation cystitis with hematuria C61 Malignant neoplasm of prostate  E11.69 Type 2 diabetes mellitus with other specified complication Modifier: Quantity: 4 Physician Procedures : CPT4 Code Description Modifier [id number] 93790 - WC PHYS HYPERBARIC OXYGEN THERAPY ICD-10 Diagnosis Description N30.41 Irradiation cystitis with hematuria C61 Malignant neoplasm of prostate E11.69 Type 2 diabetes mellitus with other specified  complication Quantity: 1 Electronic Signature(s) Signed: 02/03/2022 12:50:40 PM By: [name] EMT Signed: 02/03/2022 1:44:00 PM By: [name] DO Entered By: [name] on 02/03/2022 12:50:40

## 2022-02-03 NOTE — Progress Notes (Signed)
LUDWIG, TUGWELL ([id number]) Visit Report for 01/31/2022 HBO Details Patient Name: Date of Service: Andrew Estimable, [name] 01/31/2022 8:00 A M Medical Record Number: [id number] Patient Account Number: [id number] Date of Birth/Sex: Treating RN: 05/03/1956 (66 y.o. Andrew [name] Primary Care Shelia Kingsberry: [name] Other Clinician: [name] Referring Tani Virgo: Treating Lolah Coghlan/Extender: [name] in Treatment: 2 HBO Treatment Course Details Treatment Course Number: 1 Ordering Sherah Lund: [name] T Treatments Ordered: otal 40 HBO Treatment Start Date: 01/20/2022 HBO Indication: Late Effect of Radiation HBO Treatment Details Treatment Number: 10 Patient Type: Outpatient Chamber Type: Monoplace Chamber Serial #: [id number] Treatment Protocol: 2.5 ATA with 90 minutes oxygen, with two 5 minute air breaks Treatment Details Compression Rate Down: 1.0 psi / minute De-Compression Rate Up: 1.0 psi / minute A breaks and breathing ir Compress Tx Pressure periods Decompress Decompress Begins Reached (leave unused spaces Begins Ends blank) Chamber Pressure (ATA 1 2.5 2.5 2.5 2.5 2.5 - - 2.5 1 ) Clock Time (24 hr) 08:24 08:46 09:16 09:21 09:51 09:56 - - 10:26 10:46 Treatment Length: 142 (minutes) Treatment Segments: 5 Vital Signs Capillary Blood Glucose Reference Range: 80 - 120 mg / dl HBO Diabetic Blood Glucose Intervention Range: <131 mg/dl or >249 mg/dl Time Vitals Blood Respiratory Capillary Blood Glucose Pulse Action Type: Pulse: Temperature: Taken: Pressure: Rate: Glucose (mg/dl): Meter #: Oximetry (%) Taken: Pre 07:53 128/78 60 18 98.2 182 Post 10:51 128/88 64 18 97.8 154 Treatment Response Treatment Toleration: Well Treatment Completion Status: Treatment Completed without Adverse Event Treatment Notes Patient has had some instances of not being able to equalize pressure in his middle ear(s). Patient was placed in the  chamber after performing safety check. Patient travelled at 1 psi/min without incident for compression and decompression phases of treatment. Physician HBO Attestation: I certify that I supervised this HBO treatment in accordance with Medicare guidelines. A trained emergency response team is readily available per Yes hospital policies and procedures. Continue HBOT as ordered. Yes Electronic Signature(s) Signed: 01/31/2022 12:39:47 PM By: [name] DO Entered By: [name] on 01/31/2022 12:39:21 -------------------------------------------------------------------------------- HBO Safety Checklist Details Patient Name: Date of Service: Andrew Estimable, [name] 01/31/2022 8:00 A M Medical Record Number: [id number] Patient Account Number: [id number] Date of Birth/Sex: Treating RN: February 25, 1956 (66 y.o. Andrew [name] Primary Care Enna Warwick: [name] Other Clinician: [name] Referring Maggy Wyble: Treating Megon Kalina/Extender: [name] in Treatment: 2 HBO Safety Checklist Items Safety Checklist Consent Form Signed Patient voided / foley secured and emptied When did you last eato 0630 Last dose of injectable or oral agent Yesterday after HBO Tx Ostomy pouch emptied and vented if applicable NA All implantable devices assessed, documented and approved NA Intravenous access site secured and place NA Valuables secured Linens and cotton and cotton/polyester blend (less than 51% polyester) Personal oil-based products / skin lotions / body lotions removed Wigs or hairpieces removed NA Smoking or tobacco materials removed NA Books / newspapers / magazines / loose paper removed Cologne, aftershave, perfume and deodorant removed Jewelry removed (may wrap wedding band) Make-up removed NA Hair care products removed Battery operated devices (external) removed Heating patches and chemical warmers removed NA Titanium eyewear removed  Not wearing eyewear in chamber (titanium) Nail polish cured greater than 10 hours NA Casting material cured greater than 10 hours NA Hearing aids removed NA Loose dentures or partials removed NA Prosthetics have been removed NA Patient demonstrates correct use of air break device (if  applicable) Patient concerns have been addressed Patient grounding bracelet on and cord attached to chamber Specifics for Inpatients (complete in addition to above) Medication sheet sent with patient NA Intravenous medications needed or due during therapy sent with patient NA Drainage tubes (e.g. nasogastric tube or chest tube secured and vented) NA Endotracheal or Tracheotomy tube secured NA Cuff deflated of air and inflated with saline NA Airway suctioned NA Notes Paper version used prior to treatment start. Electronic Signature(s) Signed: 02/03/2022 3:54:17 PM By: [name] CHT EMT BS , , Entered By: [name] on 01/31/2022 10:59:24

## 2022-02-04 ENCOUNTER — Encounter (HOSPITAL_BASED_OUTPATIENT_CLINIC_OR_DEPARTMENT_OTHER): Payer: 59 | Admitting: Internal Medicine

## 2022-02-04 DIAGNOSIS — N3041 Irradiation cystitis with hematuria: Secondary | ICD-10-CM | POA: Diagnosis not present

## 2022-02-04 LAB — GLUCOSE, CAPILLARY
Glucose-Capillary: 200 mg/dL — ABNORMAL HIGH (ref 70–99)
Glucose-Capillary: 204 mg/dL — ABNORMAL HIGH (ref 70–99)

## 2022-02-04 NOTE — Progress Notes (Addendum)
ALADDIN, KOLLMANN ([id number]) Visit Report for 02/04/2022 Arrival Information Details Patient Name: Date of Service: [name], DO [place] LD E. 02/04/2022 8:00 A M Medical Record Number: [id number] Patient Account Number: [id number] Date of Birth/Sex: Treating RN: 13-Jun-1956 (66 y.o. [name] Primary Care Marijean Montanye: [name] Other Clinician: [name] Referring Aleen Marston: Treating Jisel Fleet/Extender: [name] in Treatment: 3 Visit Information History Since Last Visit All ordered tests and consults were completed: Yes Patient Arrived: Ambulatory Added or deleted any medications: No Arrival Time: 07:51 Any new allergies or adverse reactions: No Accompanied By: None Had a fall or experienced change in No Transfer Assistance: None activities of daily living that may affect Patient Identification Verified: Yes risk of falls: Secondary Verification Process Completed: Yes Signs or symptoms of abuse/neglect since last visito No Patient Requires Transmission-Based Precautions: No Hospitalized since last visit: No Patient Has Alerts: No Implantable device outside of the clinic excluding No cellular tissue based products placed in the center since last visit: Pain Present Now: No Notes Paper version used prior to treatment start. Electronic Signature(s) Signed: 02/04/2022 9:50:03 AM By: [name] EMT Previous Signature: 02/04/2022 9:47:47 AM Version By: [name] EMT Entered By: [name] on 02/04/2022 09:50:02 -------------------------------------------------------------------------------- Encounter Discharge Information Details Patient Name: Date of Service: [name], [name] 02/04/2022 8:00 A M Medical Record Number: [id number] Patient Account Number: [id number] Date of Birth/Sex: Treating RN: 1956/07/16 (66 y.o. [name] Primary Care Jasie Meleski: [name] Other Clinician: [name] Referring  Kimmy Parish: Treating Montrae Braithwaite/Extender: [name] in Treatment: 3 Encounter Discharge Information Items Discharge Condition: Stable Ambulatory Status: Ambulatory Discharge Destination: Home Transportation: Private Auto Accompanied By: None Schedule Follow-up Appointment: No Clinical Summary of Care: Electronic Signature(s) Signed: 02/04/2022 2:20:44 PM By: [name] EMT Entered By: [name] on 02/04/2022 14:20:44 -------------------------------------------------------------------------------- Patient/Caregiver Education Details Patient Name: Date of Service: [name], [name] 2/14/2023andnbsp8:00 A M Medical Record Number: [id number] Patient Account Number: [id number] Date of Birth/Gender: Treating RN: February 29, 1956 (66 y.o. [name] Primary Care Physician: [name] Other Clinician: [name] Referring Physician: Treating Physician/Extender: [name] in Treatment: 3 Education Assessment Education Provided To: Patient Education Topics Provided Electronic Signature(s) Signed: 02/04/2022 4:12:20 PM By: [name] EMT Entered By: [name] on 02/04/2022 14:20:22 -------------------------------------------------------------------------------- Vitals Details Patient Name: Date of Service: [name], [name] 02/04/2022 8:00 A M Medical Record Number: [id number] Patient Account Number: [id number] Date of Birth/Sex: Treating RN: Apr 01, 1956 (66 y.o. [name] Primary Care Kaida Games: [name] Other Clinician: [name] Referring Vangie Henthorn: Treating Shanaia Sievers/Extender: [name] in Treatment: 3 Vital Signs Time Taken: 08:00 Temperature (F): 98.0 Height (in): 72 Pulse (bpm): 68 Weight (lbs): 260 Respiratory Rate (breaths/min): 16 Body Mass Index (BMI): 35.3 Blood Pressure (mmHg): 135/75 Capillary Blood Glucose (mg/dl):  200 Reference Range: 80 - 120 mg / dl Notes Paper version used prior to treatment start. Electronic Signature(s) Signed: 02/04/2022 9:50:43 AM By: [name] EMT Entered By: [name] on 02/04/2022 09:50:43

## 2022-02-04 NOTE — Progress Notes (Addendum)
Andrew [name], Andrew [name] ([id number]) Visit Report for 02/04/2022 HBO Details Patient Name: Date of Service: Andrew Estimable, [name] 02/04/2022 8:00 A M Medical Record Number: [id number] Patient Account Number: [id number] Date of Birth/Sex: Treating RN: 1956-01-06 (66 y.o. Andrew [name] Primary Care Andrew [name]: Andrew [name] Other Clinician: Valeria [name] Referring Andrew [name]: Treating Sixto Bowdish/Extender: [name] in Treatment: 3 HBO Treatment Course Details Treatment Course Number: 1 Ordering Shadie Sweatman: [name] T Treatments Ordered: otal 40 HBO Treatment Start Date: 01/20/2022 HBO Indication: Late Effect of Radiation HBO Treatment Details Treatment Number: 12 Patient Type: Outpatient Chamber Type: Monoplace Chamber Serial #: [id number] Treatment Protocol: 2.5 ATA with 90 minutes oxygen, with two 5 minute air breaks Treatment Details Compression Rate Down: 1.0 psi / minute De-Compression Rate Up: 1.0 psi / minute A breaks and breathing ir Compress Tx Pressure periods Decompress Decompress Begins Reached (leave unused spaces Begins Ends blank) Chamber Pressure (ATA 1 2.5 2.5 2.5 2.5 2.5 - - 2.5 1 ) Clock Time (24 hr) 08:06 08:22 08:52 08:57 09:27 09:33 - - 10:03 10:29 Treatment Length: 143 (minutes) Treatment Segments: 5 Vital Signs Capillary Blood Glucose Reference Range: 80 - 120 mg / dl HBO Diabetic Blood Glucose Intervention Range: <131 mg/dl or >249 mg/dl Time Vitals Blood Respiratory Capillary Blood Glucose Pulse Action Type: Pulse: Temperature: Taken: Pressure: Rate: Glucose (mg/dl): Meter #: Oximetry (%) Taken: Pre 08:00 135/75 68 16 98 200 Post 10:31 110/71 56 16 97.8 204 Treatment Response Treatment Toleration: Fair Adverse Events: 1:Barotrauma - Ear Treatment Completion Status: Treatment Completed with Adverse Event Treatment Notes Paper version used prior to treatment start. The patient complained of pain in his left ear  at 6PSI on decompression. Decompression stopped at 6PSI, and increased to 7 PSI. after a min, the patient was able to clear. restarted decompression, he again complained of pain in his left ear, again presser increased to 7PSI. He informed me that he had to cleared his left ear. I again stated decompression, and he companied of pain at 4.5PSI. Presser was increased to 5 PSI. Was able to clear left ear. After decompression was restarted, with no other complaints. Dr.Robson informed, and saw patient. Asked to have the patient seen by ENT before any more treatments. I called, , Andrew [name]. A appointment was made for him on 02-05-2022 at 1:30pm. I called the patient , and informed him of appointment. Andrew [name] Notes Left ear pain. He was seen after he came out of the chambers. He has a very contracted left eardrum but there may be fluid behind this. His right ear was totally occluded by cerumen. We will have this seen by ENT Electronic Signature(s) Signed: 02/04/2022 4:15:21 PM By: Andrew Ham MD Previous Signature: 02/04/2022 2:19:18 PM Version By: Andrew [name] EMT Entered By: Andrew [name] on 02/04/2022 15:46:55 -------------------------------------------------------------------------------- HBO Safety Checklist Details Patient Name: Date of Service: Andrew Estimable, [name] 02/04/2022 8:00 A M Medical Record Number: [id number] Patient Account Number: [id number] Date of Birth/Sex: Treating RN: September 03, 1956 (66 y.o. Andrew [name] Primary Care Andrew [name]: Andrew [name] Other Clinician: Valeria [name] Referring Andrew [name]: Treating Andrew [name]/Extender: [name] in Treatment: 3 HBO Safety Checklist Items Safety Checklist Consent Form Signed Patient voided / foley secured and emptied When did you last eato 0600 Last dose of injectable or oral agent NA Ostomy pouch emptied and vented if  applicable NA All implantable devices assessed, documented and  approved NA Intravenous access site secured and place NA Valuables secured Linens and cotton and cotton/polyester blend (less than 51% polyester) Personal oil-based products / skin lotions / body lotions removed NA Wigs or hairpieces removed NA Smoking or tobacco materials removed Books / newspapers / magazines / loose paper removed Cologne, aftershave, perfume and deodorant removed Jewelry removed (may wrap wedding band) NA Make-up removed NA Hair care products removed NA Battery operated devices (external) removed Heating patches and chemical warmers removed Titanium eyewear removed Nail polish cured greater than 10 hours NA Casting material cured greater than 10 hours NA Hearing aids removed NA Loose dentures or partials removed NA Prosthetics have been removed NA Patient demonstrates correct use of air break device (if applicable) Patient concerns have been addressed Patient grounding bracelet on and cord attached to chamber Specifics for Inpatients (complete in addition to above) Medication sheet sent with patient NA Intravenous medications needed or due during therapy sent with patient NA Drainage tubes ([place]. nasogastric tube or chest tube secured and vented) NA Endotracheal or Tracheotomy tube secured NA Cuff deflated of air and inflated with saline NA Airway suctioned NA Notes Paper version used prior to treatment start. Electronic Signature(s) Signed: 02/04/2022 10:16:15 AM By: Andrew [name] EMT Entered By: Andrew [name] on 02/04/2022 10:16:15

## 2022-02-04 NOTE — Progress Notes (Signed)
Andrew [name], Andrew [name] ([id number]) Visit Report for 02/04/2022 Problem List Details Patient Name: Date of Service: [name], [name] 02/04/2022 8:00 A M Medical Record Number: [id number] Patient Account Number: [id number] Date of Birth/Sex: Treating RN: 07-Jun-1956 (66 y.o. 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 02/04/2022 Medical Record Number: [id number] Patient Account Number: [id number] Date of Birth/Sex: Treating RN: 1956/11/10 (66 y.o. [name] Primary Care Provider: [name] Other Clinician: [name] Referring Provider: Treating Provider/Extender: [name] in Treatment: 3 Diagnosis Coding ICD-10 Codes Code Description N30.41 Irradiation cystitis with hematuria C61 Malignant neoplasm of prostate E11.69 Type 2 diabetes mellitus with other specified complication Facility Procedures CPT4 Code: 99833825 Description: G0277-(Facility Use Only) HBOT full body chamber, 53min , ICD-10 Diagnosis Description N30.41 Irradiation cystitis with hematuria C61 Malignant neoplasm of prostate E11.69  Type 2 diabetes mellitus with other specified complication Modifier: Quantity: 5 Physician Procedures : CPT4 Code Description Modifier [id number] - WC PHYS HYPERBARIC OXYGEN THERAPY ICD-10 Diagnosis Description N30.41 Irradiation cystitis with hematuria C61 Malignant neoplasm of prostate E11.69 Type 2 diabetes mellitus with other specified  complication Quantity: 1 Electronic Signature(s) Signed: 02/04/2022 2:19:35 PM By: [name] EMT Signed: 02/04/2022 4:15:21 PM By: Linton Ham MD Entered By: [name] on 02/04/2022 14:19:35

## 2022-02-05 ENCOUNTER — Encounter (HOSPITAL_BASED_OUTPATIENT_CLINIC_OR_DEPARTMENT_OTHER): Payer: 59 | Admitting: Internal Medicine

## 2022-02-06 ENCOUNTER — Other Ambulatory Visit: Payer: Self-pay

## 2022-02-06 ENCOUNTER — Encounter (HOSPITAL_BASED_OUTPATIENT_CLINIC_OR_DEPARTMENT_OTHER): Payer: 59 | Admitting: Internal Medicine

## 2022-02-06 DIAGNOSIS — N3041 Irradiation cystitis with hematuria: Secondary | ICD-10-CM | POA: Diagnosis not present

## 2022-02-06 LAB — GLUCOSE, CAPILLARY
Glucose-Capillary: 218 mg/dL — ABNORMAL HIGH (ref 70–99)
Glucose-Capillary: 180 mg/dL — ABNORMAL HIGH (ref 70–99)

## 2022-02-06 NOTE — Progress Notes (Addendum)
Andrew [name], Andrew [name] ([id number]) Visit Report for 02/06/2022 HBO Details Patient Name: Date of Service: Andrew Estimable, [name] 02/06/2022 8:00 A M Medical Record Number: [id number] Patient Account Number: [id number] Date of Birth/Sex: Treating RN: [birth year] (66 y.o. Andrew [name] Primary Care Zenaya Ulatowski: [name] Other Clinician: [name] Referring Aurelia Gras: Treating Ephraim Reichel/Extender: [name] in Treatment: 3 HBO Treatment Course Details Treatment Course Number: 1 Ordering Nguyen Todorov: [name] T Treatments Ordered: otal 40 HBO Treatment Start Date: 01/20/2022 HBO Indication: Late Effect of Radiation HBO Treatment Details Treatment Number: 13 Patient Type: Outpatient Chamber Type: Monoplace Chamber Serial #: [id number] Treatment Protocol: 2.5 ATA with 90 minutes oxygen, with two 5 minute air breaks Treatment Details Compression Rate Down: 1.0 psi / minute De-Compression Rate Up: 1.0 psi / minute A breaks and breathing ir Compress Tx Pressure periods Decompress Decompress Begins Reached (leave unused spaces Begins Ends blank) Chamber Pressure (ATA 1 2.5 2.5 2.5 2.5 2.5 - - 2.5 1 ) Clock Time (24 hr) 08:32 08:52 09:22 09:27 09:57 10:02 - - 10:32 10:54 Treatment Length: 142 (minutes) Treatment Segments: 5 Vital Signs Capillary Blood Glucose Reference Range: 80 - 120 mg / dl HBO Diabetic Blood Glucose Intervention Range: <131 mg/dl or >249 mg/dl Time Vitals Blood Respiratory Capillary Blood Glucose Pulse Action Type: Pulse: Temperature: Taken: Pressure: Rate: Glucose (mg/dl): Meter #: Oximetry (%) Taken: Pre 08:22 124/75 70 16 98 218 Post 10:58 110/77 53 18 97.8 180 Treatment Response Treatment Toleration: Well Treatment Completion Status: Treatment Completed without Adverse Event Treatment Notes Paper version used prior to treatment start. The patient did well today. Annsley Akkerman Notes Patient returns to clinic. Tolerated  treatment well Physician HBO Attestation: I certify that I supervised this HBO treatment in accordance with Medicare guidelines. A trained emergency response team is readily available per Yes hospital policies and procedures. Continue HBOT as ordered. Yes Electronic Signature(s) Signed: 02/06/2022 5:55:17 PM By: Linton Ham MD Previous Signature: 02/06/2022 11:30:05 AM Version By: [name] EMT Entered By: Linton Ham on 02/06/2022 17:49:19 -------------------------------------------------------------------------------- HBO Safety Checklist Details Patient Name: Date of Service: Andrew Estimable, [name] 02/06/2022 8:00 A M Medical Record Number: [id number] Patient Account Number: [id number] Date of Birth/Sex: Treating RN: [birth year] (66 y.o. Andrew [name] Primary Care Andrew [name]: [name] Other Clinician: [name] Referring Goodwin Kamphaus: Treating Mouhamad Teed/Extender: [name] in Treatment: 3 HBO Safety Checklist Items Safety Checklist Consent Form Signed Patient voided / foley secured and emptied When did you last eato 0630 Last dose of injectable or oral agent After treatment Ostomy pouch emptied and vented if applicable NA All implantable devices assessed, documented and approved NA Intravenous access site secured and place NA Valuables secured Linens and cotton and cotton/polyester blend (less than 51% polyester) Personal oil-based products / skin lotions / body lotions removed Wigs or hairpieces removed NA Smoking or tobacco materials removed Books / newspapers / magazines / loose paper removed Cologne, aftershave, perfume and deodorant removed Jewelry removed (may wrap wedding band) NA Make-up removed NA Hair care products removed NA Battery operated devices (external) removed Heating patches and chemical warmers removed Titanium eyewear removed NA Nail polish cured greater than 10 hours NA Casting material  cured greater than 10 hours NA Hearing aids removed NA Loose dentures or partials removed NA Prosthetics have been removed NA Patient demonstrates correct use of air break device (if applicable) Patient concerns have been addressed Patient grounding bracelet on and cord attached to chamber Specifics  for Inpatients (complete in addition to above) Medication sheet sent with patient NA Intravenous medications needed or due during therapy sent with patient NA Drainage tubes (e.g. nasogastric tube or chest tube secured and vented) NA Endotracheal or Tracheotomy tube secured NA Cuff deflated of air and inflated with saline NA Airway suctioned NA Notes Paper version used prior to treatment start. Electronic Signature(s) Signed: 02/06/2022 9:58:38 AM By: [name] EMT Entered By: [name] on 02/06/2022 09:58:38

## 2022-02-06 NOTE — Progress Notes (Addendum)
[name], REASONS ([id number]) Visit Report for 02/06/2022 Arrival Information Details Patient Name: Date of Service: Andrew Estimable, DO [place] LD E. 02/06/2022 8:00 A M Medical Record Number: [id number] Patient Account Number: [id number] Date of Birth/Sex: Treating RN: October 17, 1956 (66 y.o. [name] Primary Care Shannyn Jankowiak: [name] Other Clinician: [name] Referring Madilyne Tadlock: Treating Trayvion Embleton/Extender: [name] in Treatment: 3 Visit Information History Since Last Visit All ordered tests and consults were completed: Yes Patient Arrived: Ambulatory Added or deleted any medications: No Arrival Time: 08:06 Any new allergies or adverse reactions: No Accompanied By: None Had a fall or experienced change in No Transfer Assistance: None activities of daily living that may affect Patient Identification Verified: Yes risk of falls: Secondary Verification Process Completed: Yes Signs or symptoms of abuse/neglect since last visito No Patient Requires Transmission-Based Precautions: No Hospitalized since last visit: No Patient Has Alerts: No Implantable device outside of the clinic excluding Yes cellular tissue based products placed in the center since last visit: Pain Present Now: No Notes paper version used prior to treatment start. Dr. Fredric Dine at [facility] on 02/06/2022 saw the patient. He had tubes placed in his left ear and cerumen removed from his right. Dr. Dellia Nims was given a copy of notes from Dr. Fredric Dine. Electronic Signature(s) Signed: 02/06/2022 10:06:20 AM By: [name] EMT Previous Signature: 02/06/2022 9:55:20 AM Version By: [name] EMT Entered By: [name] on 02/06/2022 10:06:20 -------------------------------------------------------------------------------- Encounter Discharge Information Details Patient Name: Date of Service: Andrew Estimable, [name] 02/06/2022 8:00 A M Medical  Record Number: [id number] Patient Account Number: [id number] Date of Birth/Sex: Treating RN: [birth year] (66 y.o. [name] Primary Care Lear Carstens: [name] Other Clinician: [name] Referring Katiya Fike: Treating Lea Walbert/Extender: [name] in Treatment: 3 Encounter Discharge Information Items Discharge Condition: Stable Ambulatory Status: Ambulatory Discharge Destination: Home Transportation: Private Auto Accompanied By: None Schedule Follow-up Appointment: Yes Clinical Summary of Care: Notes I asked the patient before he left, how were his ears. He stated fine, no problem. Electronic Signature(s) Signed: 02/06/2022 11:32:03 AM By: [name] EMT Entered By: [name] on 02/06/2022 11:32:03 -------------------------------------------------------------------------------- Patient/Caregiver Education Details Patient Name: Date of Service: Andrew Estimable, [name] 2/16/2023andnbsp8:00 Enders Record Number: [id number] Patient Account Number: [id number] Date of Birth/Gender: Treating RN: 26-Nov-1956 (66 y.o. [name] Primary Care Physician: [name] Other Clinician: [name] Referring Physician: Treating Physician/Extender: [name] in Treatment: 3 Education Assessment Education Provided To: Patient Education Topics Provided Electronic Signature(s) Signed: 02/07/2022 8:47:42 AM By: [name] EMT Entered By: [name] on 02/06/2022 11:30:57 -------------------------------------------------------------------------------- Vitals Details Patient Name: Date of Service: Andrew Estimable, [name] 02/06/2022 8:00 A M Medical Record Number: [id number] Patient Account Number: [id number] Date of Birth/Sex: Treating RN: 1956-10-21 (65 y.o. [name] Primary Care Brittian Renaldo: [name] Other Clinician: [name] Referring Clarita Mcelvain: Treating  Azuri Bozard/Extender: [name] in Treatment: 3 Vital Signs Time Taken: 08:22 Temperature (F): 98.0 Height (in): 72 Pulse (bpm): 70 Weight (lbs): 260 Respiratory Rate (breaths/min): 16 Body Mass Index (BMI): 35.3 Blood Pressure (mmHg): 124/75 Capillary Blood Glucose (mg/dl): 218 Reference Range: 80 - 120 mg / dl Notes Paper version used prior to treatment start. Electronic Signature(s) Signed: 02/06/2022 9:56:09 AM By: [name] EMT Entered By: [name] on 02/06/2022 09:56:09

## 2022-02-06 NOTE — Progress Notes (Signed)
WORTHY, BOSCHERT ([id number]) Visit Report for 02/06/2022 Problem List Details Patient Name: Date of Service: Andrew Estimable, [name] 02/06/2022 8:00 A M Medical Record Number: [id number] Patient Account Number: [id number] Date of Birth/Sex: Treating RN: 18-Aug-1956 (66 y.o. 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 02/06/2022 Medical Record Number: [id number] Patient Account Number: [id number] Date of Birth/Sex: Treating RN: [birth year] (66 y.o. [name] Primary Care Provider: [name] Other Clinician: [name] Referring Provider: Treating Provider/Extender: [name] in Treatment: 3 Diagnosis Coding ICD-10 Codes Code Description N30.41 Irradiation cystitis with hematuria C61 Malignant neoplasm of prostate E11.69 Type 2 diabetes mellitus with other specified complication Facility Procedures CPT4 Code: [id number] Description: G0277-(Facility Use Only) HBOT full body chamber, 74min , ICD-10 Diagnosis Description N30.41 Irradiation cystitis with hematuria C61 Malignant neoplasm of prostate E11.69  Type 2 diabetes mellitus with other specified complication Modifier: Quantity: 5 Physician Procedures : CPT4 Code Description Modifier [id number] - WC PHYS HYPERBARIC OXYGEN THERAPY ICD-10 Diagnosis Description N30.41 Irradiation cystitis with hematuria C61 Malignant neoplasm of prostate E11.69 Type 2 diabetes mellitus with other specified  complication Quantity: 1 Electronic Signature(s) Signed: 02/06/2022 11:30:26 AM By: [name] EMT Signed: 02/06/2022 5:55:17 PM By: Linton Ham MD Entered By: [name] on 02/06/2022 11:30:26

## 2022-02-07 ENCOUNTER — Encounter (HOSPITAL_BASED_OUTPATIENT_CLINIC_OR_DEPARTMENT_OTHER): Payer: 59 | Admitting: Internal Medicine

## 2022-02-07 DIAGNOSIS — E1169 Type 2 diabetes mellitus with other specified complication: Secondary | ICD-10-CM

## 2022-02-07 DIAGNOSIS — C61 Malignant neoplasm of prostate: Secondary | ICD-10-CM

## 2022-02-07 DIAGNOSIS — N3041 Irradiation cystitis with hematuria: Secondary | ICD-10-CM

## 2022-02-07 LAB — GLUCOSE, CAPILLARY
Glucose-Capillary: 172 mg/dL — ABNORMAL HIGH (ref 70–99)
Glucose-Capillary: 156 mg/dL — ABNORMAL HIGH (ref 70–99)

## 2022-02-07 NOTE — Progress Notes (Signed)
Andrew [name], Andrew [name] ([id number]) Visit Report for 02/07/2022 Problem List Details Patient Name: Date of Service: [name], [name] 02/07/2022 8:00 A M Medical Record Number: [id number] Patient Account Number: [id number] Date of Birth/Sex: Treating RN: [birth year] (66 y.o. 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 02/07/2022 Medical Record Number: [id number] Patient Account Number: [id number] Date of Birth/Sex: Treating RN: 30-Apr-1956 (66 y.o. [name] Primary Care Provider: [name] Other Clinician: [name] Referring Provider: Treating Provider/Extender: [name] in Treatment: 3 Diagnosis Coding ICD-10 Codes Code Description N30.41 Irradiation cystitis with hematuria C61 Malignant neoplasm of prostate E11.69 Type 2 diabetes mellitus with other specified complication Facility Procedures CPT4 Code: [id number] Description: G0277-(Facility Use Only) HBOT full body chamber, 57min , ICD-10 Diagnosis Description N30.41 Irradiation cystitis with hematuria C61 Malignant neoplasm of prostate  E11.69 Type 2 diabetes mellitus with other specified complication Modifier: Quantity: 5 Physician Procedures : CPT4 Code Description Modifier [id number] - WC PHYS HYPERBARIC OXYGEN THERAPY ICD-10 Diagnosis Description N30.41 Irradiation cystitis with hematuria C61 Malignant neoplasm of prostate E11.69 Type 2 diabetes mellitus with other specified  complication Quantity: 1 Electronic Signature(s) Signed: 02/07/2022 11:27:16 AM By: [name] EMT Signed: 02/07/2022 12:32:44 PM By: [name] DO Entered By: [name] on 02/07/2022 11:27:16

## 2022-02-07 NOTE — Progress Notes (Addendum)
DARYLE, AMIS ([id number]) Visit Report for 02/07/2022 Arrival Information Details Patient Name: Date of Service: [name], DO [place] LD E. 02/07/2022 8:00 A M Medical Record Number: [id number] Patient Account Number: [id number] Date of Birth/Sex: Treating RN: [birth year] (66 y.o. [name] Primary Care Milderd Manocchio: [name] Other Clinician: [name] Referring Delona Clasby: Treating Alistar Mcenery/Extender: [name] in Treatment: 3 Visit Information History Since Last Visit All ordered tests and consults were completed: Yes Patient Arrived: Ambulatory Added or deleted any medications: No Arrival Time: 07:56 Any new allergies or adverse reactions: No Accompanied By: None Had a fall or experienced change in No Transfer Assistance: None activities of daily living that may affect Patient Identification Verified: Yes risk of falls: Secondary Verification Process Completed: Yes Signs or symptoms of abuse/neglect since last visito No Patient Requires Transmission-Based Precautions: No Hospitalized since last visit: No Patient Has Alerts: No Implantable device outside of the clinic excluding No cellular tissue based products placed in the center since last visit: Pain Present Now: No Notes Paper version used prior to treatment start. Electronic Signature(s) Signed: 02/07/2022 8:54:30 AM By: [name] EMT Entered By: [name] on 02/07/2022 08:54:29 -------------------------------------------------------------------------------- Encounter Discharge Information Details Patient Name: Date of Service: [name], [name] 02/07/2022 8:00 A M Medical Record Number: [id number] Patient Account Number: [id number] Date of Birth/Sex: Treating RN: November 20, 1956 (66 y.o. [name] Primary Care Shemekia Patane: [name] Other Clinician: [name] Referring Terree Gaultney: Treating Nashira Mcglynn/Extender: [name] in Treatment: 3 Encounter Discharge Information Items Discharge Condition: Stable Ambulatory Status: Ambulatory Discharge Destination: Home Transportation: Private Auto Accompanied By: None Schedule Follow-up Appointment: Yes Clinical Summary of Care: Electronic Signature(s) Signed: 02/07/2022 11:28:08 AM By: [name] EMT Entered By: [name] on 02/07/2022 11:28:08 -------------------------------------------------------------------------------- Patient/Caregiver Education Details Patient Name: Date of Service: [name], [name] 2/17/2023andnbsp8:00 A M Medical Record Number: [id number] Patient Account Number: [id number] Date of Birth/Gender: Treating RN: [birth year] (66 y.o. [name] Primary Care Physician: [name] Other Clinician: [name] Referring Physician: Treating Physician/Extender: [name] in Treatment: 3 Education Assessment Education Provided To: Patient Education Topics Provided Electronic Signature(s) Signed: 02/07/2022 3:20:23 PM By: [name] EMT Entered By: [name] on 02/07/2022 11:27:44 -------------------------------------------------------------------------------- Vitals Details Patient Name: Date of Service: [name], [name] 02/07/2022 8:00 A M Medical Record Number: [id number] Patient Account Number: [id number] Date of Birth/Sex: Treating RN: November 19, 1956 (66 y.o. [name] Primary Care Esmeralda Malay: [name] Other Clinician: [name] Referring Luana Tatro: Treating Alayla Dethlefs/Extender: [name] in Treatment: 3 Vital Signs Time Taken: 07:58 Temperature (F): 98.0 Height (in): 72 Pulse (bpm): 72 Weight (lbs): 260 Respiratory Rate (breaths/min): 18 Body Mass Index (BMI): 35.3 Blood Pressure (mmHg): 124/72 Reference Range: 80 - 120 mg / dl Notes Paper version used prior to treatment start. Electronic  Signature(s) Signed: 02/07/2022 8:55:18 AM By: [name] EMT Entered By: [name] on 02/07/2022 08:55:18

## 2022-02-07 NOTE — Progress Notes (Addendum)
RUDI, BUNYARD ([id number]) Visit Report for 02/07/2022 HBO Details Patient Name: Date of Service: Andrew Estimable, [name] 02/07/2022 8:00 A M Medical Record Number: [id number] Patient Account Number: [id number] Date of Birth/Sex: Treating RN: January 28, 1956 (66 y.o. [name] Primary Care Hans Rusher: [name] Other Clinician: [name] Referring Onyinyechi Huante: Treating Iver Fehrenbach/Extender: [name] in Treatment: 3 HBO Treatment Course Details Treatment Course Number: 1 Ordering Oaklee Sunga: [name] T Treatments Ordered: otal 40 HBO Treatment Start Date: 01/20/2022 HBO Indication: Late Effect of Radiation HBO Treatment Details Treatment Number: 14 Patient Type: Outpatient Chamber Type: Monoplace Chamber Serial #: [id number] Treatment Protocol: 2.5 ATA with 90 minutes oxygen, with two 5 minute air breaks Treatment Details Compression Rate Down: 1.0 psi / minute De-Compression Rate Up: 1.0 psi / minute A breaks and breathing ir Compress Tx Pressure periods Decompress Decompress Begins Reached (leave unused spaces Begins Ends blank) Chamber Pressure (ATA 1 2.5 2.5 2.5 2.5 2.5 - - 2.5 1 ) Clock Time (24 hr) 08:20 08:43 09:13 09:18 09:48 09:54 - - 10:24 10:48 Treatment Length: 148 (minutes) Treatment Segments: 5 Vital Signs Capillary Blood Glucose Reference Range: 80 - 120 mg / dl HBO Diabetic Blood Glucose Intervention Range: <131 mg/dl or >249 mg/dl Time Vitals Blood Respiratory Capillary Blood Glucose Pulse Action Type: Pulse: Temperature: Taken: Pressure: Rate: Glucose (mg/dl): Meter #: Oximetry (%) Taken: Pre 07:58 124/72 72 18 98 Post 10:50 113/73 68 16 97.9 156 Treatment Response Treatment Toleration: Well Treatment Completion Status: Treatment Completed without Adverse Event Treatment Notes Paper version used prior to treatment start. The patient stated that he did well today during the treatment Physician HBO  Attestation: I certify that I supervised this HBO treatment in accordance with Medicare guidelines. A trained emergency response team is readily available per Yes hospital policies and procedures. Continue HBOT as ordered. Yes Electronic Signature(s) Signed: 02/07/2022 12:32:44 PM By: [name] DO Previous Signature: 02/07/2022 11:26:54 AM Version By: [name] EMT Entered By: [name] on 02/07/2022 12:31:51 -------------------------------------------------------------------------------- HBO Safety Checklist Details Patient Name: Date of Service: Andrew Estimable, [name] 02/07/2022 8:00 A M Medical Record Number: [id number] Patient Account Number: [id number] Date of Birth/Sex: Treating RN: 12/10/1956 (66 y.o. [name] Primary Care Roger Fasnacht: [name] Other Clinician: [name] Referring Karryn Kosinski: Treating Kearia Yin/Extender: [name] in Treatment: 3 HBO Safety Checklist Items Safety Checklist Consent Form Signed Patient voided / foley secured and emptied When did you last eato 0630 Last dose of injectable or oral agent After treatment Ostomy pouch emptied and vented if applicable NA All implantable devices assessed, documented and approved NA Intravenous access site secured and place NA Valuables secured Linens and cotton and cotton/polyester blend (less than 51% polyester) Personal oil-based products / skin lotions / body lotions removed Wigs or hairpieces removed NA Smoking or tobacco materials removed Books / newspapers / magazines / loose paper removed Cologne, aftershave, perfume and deodorant removed Jewelry removed (may wrap wedding band) Make-up removed NA Hair care products removed Battery operated devices (external) removed Heating patches and chemical warmers removed Titanium eyewear removed NA Nail polish cured greater than 10 hours NA Casting material cured greater than 10  hours NA Hearing aids removed NA Loose dentures or partials removed NA Prosthetics have been removed NA Patient demonstrates correct use of air break device (if applicable) Patient concerns have been addressed Patient grounding bracelet on and cord attached to chamber Specifics for Inpatients (complete in addition to  above) Medication sheet sent with patient NA Intravenous medications needed or due during therapy sent with patient NA Drainage tubes (e.g. nasogastric tube or chest tube secured and vented) NA Endotracheal or Tracheotomy tube secured NA Cuff deflated of air and inflated with saline NA Airway suctioned NA Notes Paper version used prior to treatment start. Electronic Signature(s) Signed: 02/07/2022 8:56:47 AM By: [name] EMT Entered By: [name] on 02/07/2022 08:56:47

## 2022-02-10 ENCOUNTER — Encounter (HOSPITAL_BASED_OUTPATIENT_CLINIC_OR_DEPARTMENT_OTHER): Payer: 59 | Admitting: Internal Medicine

## 2022-02-10 ENCOUNTER — Other Ambulatory Visit: Payer: Self-pay

## 2022-02-10 DIAGNOSIS — N3041 Irradiation cystitis with hematuria: Secondary | ICD-10-CM

## 2022-02-10 DIAGNOSIS — E1169 Type 2 diabetes mellitus with other specified complication: Secondary | ICD-10-CM

## 2022-02-10 DIAGNOSIS — C61 Malignant neoplasm of prostate: Secondary | ICD-10-CM | POA: Diagnosis not present

## 2022-02-10 LAB — GLUCOSE, CAPILLARY
Glucose-Capillary: 181 mg/dL — ABNORMAL HIGH (ref 70–99)
Glucose-Capillary: 173 mg/dL — ABNORMAL HIGH (ref 70–99)

## 2022-02-10 NOTE — Progress Notes (Addendum)
MONTREL, DONAHOE ([id number]) Visit Report for 02/10/2022 Arrival Information Details Patient Name: Date of Service: [name], DO [place] LD E. 02/10/2022 8:00 A M Medical Record Number: [id number] Patient Account Number: [id number] Date of Birth/Sex: Treating RN: 04-29-1956 (66 y.o. [name] Primary Care Garnet Chatmon: [name] Other Clinician: [name] Referring Shandell Jallow: Treating Luismanuel Corman/Extender: [name] in Treatment: 4 Visit Information History Since Last Visit All ordered tests and consults were completed: Yes Patient Arrived: Ambulatory Added or deleted any medications: No Arrival Time: 08:00 Any new allergies or adverse reactions: No Accompanied By: None Had a fall or experienced change in No Transfer Assistance: None activities of daily living that may affect Patient Identification Verified: Yes risk of falls: Secondary Verification Process Completed: Yes Signs or symptoms of abuse/neglect since last visito No Patient Requires Transmission-Based Precautions: No Hospitalized since last visit: No Patient Has Alerts: No Implantable device outside of the clinic excluding No cellular tissue based products placed in the center since last visit: Pain Present Now: No Notes Paper version used prior to treatment start. Electronic Signature(s) Signed: 02/10/2022 11:36:04 AM By: [name] EMT Entered By: [name] on 02/10/2022 11:36:03 -------------------------------------------------------------------------------- Encounter Discharge Information Details Patient Name: Date of Service: [name], [name] 02/10/2022 8:00 A M Medical Record Number: [id number] Patient Account Number: [id number] Date of Birth/Sex: Treating RN: May 26, 1956 (66 y.o. [name] Primary Care Jo-Ann Johanning: [name] Other Clinician: [name] Referring Laramie Gelles: Treating Corrie Brannen/Extender: [name] in Treatment: 4 Encounter Discharge Information Items Discharge Condition: Stable Ambulatory Status: Ambulatory Discharge Destination: Home Transportation: Private Auto Accompanied By: None Schedule Follow-up Appointment: Yes Clinical Summary of Care: Electronic Signature(s) Signed: 02/10/2022 12:00:20 PM By: [name] EMT Entered By: [name] on 02/10/2022 12:00:20 -------------------------------------------------------------------------------- Patient/Caregiver Education Details Patient Name: Date of Service: [name], [name] 2/20/2023andnbsp8:00 A M Medical Record Number: [id number] Patient Account Number: [id number] Date of Birth/Gender: Treating RN: Mar 18, 1956 (66 y.o. [name] Primary Care Physician: [name] Other Clinician: [name] Referring Physician: Treating Physician/Extender: [name] in Treatment: 4 Education Assessment Education Provided To: Patient Education Topics Provided Electronic Signature(s) Signed: 02/11/2022 1:41:45 PM By: [name] EMT Entered By: [name] on 02/10/2022 12:00:01 -------------------------------------------------------------------------------- Vitals Details Patient Name: Date of Service: [name], [name] 02/10/2022 8:00 A M Medical Record Number: [id number] Patient Account Number: [id number] Date of Birth/Sex: Treating RN: 03/15/1956 (66 y.o. [name] Primary Care Jeromy Borcherding: [name] Other Clinician: [name] Referring Jonne Rote: Treating Pauleen Goleman/Extender: [name] in Treatment: 4 Vital Signs Time Taken: 08:20 Temperature (F): 97.9 Height (in): 72 Pulse (bpm): 60 Weight (lbs): 260 Respiratory Rate (breaths/min): 16 Body Mass Index (BMI): 35.3 Blood Pressure (mmHg): 125/71 Capillary Blood Glucose (mg/dl): 181 Reference Range: 80 - 120 mg / dl Notes Paper version used  prior to treatment start. Electronic Signature(s) Signed: 02/10/2022 11:36:41 AM By: [name] EMT Entered By: [name] on 02/10/2022 11:36:41

## 2022-02-10 NOTE — Progress Notes (Addendum)
DEAGEN, KRASS ([id number]) Visit Report for 02/10/2022 HBO Details Patient Name: Date of Service: Andrew Estimable, [name] 02/10/2022 8:00 A M Medical Record Number: [id number] Patient Account Number: [id number] Date of Birth/Sex: Treating RN: [birth year] (66 y.o. [name] Primary Care [name]: [name] Other Clinician: [name] Referring [name]: Treating [name]/Extender: [name] in Treatment: 4 HBO Treatment Course Details Treatment Course Number: 1 Ordering Raeven Pint: [name] T Treatments Ordered: otal 40 HBO Treatment Start Date: 01/20/2022 HBO Indication: Late Effect of Radiation HBO Treatment Details Treatment Number: 15 Patient Type: Outpatient Chamber Type: Monoplace Chamber Serial #: [id number] Treatment Protocol: 2.5 ATA with 90 minutes oxygen, with two 5 minute air breaks Treatment Details Compression Rate Down: 1.5 psi / minute De-Compression Rate Up: 1.0 psi / minute A breaks and breathing ir Compress Tx Pressure periods Decompress Decompress Begins Reached (leave unused spaces Begins Ends blank) Chamber Pressure (ATA 1 2.5 2.5 2.5 2.5 2.5 - - 2.5 1 ) Clock Time (24 hr) 08:28 08:43 09:12 09:17 09:47 09:52 - - 10:23 10:48 Treatment Length: 140 (minutes) Treatment Segments: 5 Vital Signs Capillary Blood Glucose Reference Range: 80 - 120 mg / dl HBO Diabetic Blood Glucose Intervention Range: <131 mg/dl or >249 mg/dl Time Vitals Blood Respiratory Capillary Blood Glucose Pulse Action Type: Pulse: Temperature: Taken: Pressure: Rate: Glucose (mg/dl): Meter #: Oximetry (%) Taken: Pre 08:20 125/71 60 16 97.9 181 Post 10:51 121/97 64 16 97.9 173 Treatment Response Treatment Toleration: Well Treatment Completion Status: Treatment Completed without Adverse Event Treatment Notes Paper version used prior to treatment start. MScammell: During decompression, while travelling at 1.5 psi/min, the patient  stated that he needed to stop travel @ 12PSI to help with clearing his ear. Travel stopped and was restarted after the patient let us know he had cleared his right ear. Travel was changed to 1 PSI/Min at that time. The patient had no other problems. Physician HBO Attestation: I certify that I supervised this HBO treatment in accordance with Medicare guidelines. A trained emergency response team is readily available per Yes hospital policies and procedures. Continue HBOT as ordered. Yes Electronic Signature(s) Signed: 02/13/2022 4:33:44 PM By: [name] DO Signed: 02/14/2022 10:22:35 AM By: [name] CHT EMT BS , , Previous Signature: 02/10/2022 2:02:39 PM Version By: [name] DO Previous Signature: 02/10/2022 11:59:06 AM Version By: [name] EMT Entered By: [name] on 02/13/2022 15:42:19 -------------------------------------------------------------------------------- HBO Safety Checklist Details Patient Name: Date of Service: Andrew Estimable, [name] 02/10/2022 8:00 A M Medical Record Number: [id number] Patient Account Number: [id number] Date of Birth/Sex: Treating RN: Oct 08, 1956 (66 y.o. [name] Primary Care Alliah Boulanger: [name] Other Clinician: [name] Referring [name]: Treating [name]/Extender: [name] in Treatment: 4 HBO Safety Checklist Items Safety Checklist Consent Form Signed Patient voided / foley secured and emptied When did you last eato 0630 Last dose of injectable or oral agent After tretment Ostomy pouch emptied and vented if applicable NA All implantable devices assessed, documented and approved NA Intravenous access site secured and place NA Valuables secured Linens and cotton and cotton/polyester blend (less than 51% polyester) Personal oil-based products / skin lotions / body lotions removed Wigs or hairpieces removed NA Smoking or tobacco materials removed Books  / newspapers / magazines / loose paper removed Cologne, aftershave, perfume and deodorant removed Jewelry removed (may wrap wedding band) NA Make-up removed NA Hair care products removed Battery operated devices (external) removed Heating patches  and chemical warmers removed Titanium eyewear removed NA Nail polish cured greater than 10 hours NA Casting material cured greater than 10 hours NA Hearing aids removed NA Loose dentures or partials removed NA Prosthetics have been removed NA Patient demonstrates correct use of air break device (if applicable) Patient concerns have been addressed Patient grounding bracelet on and cord attached to chamber Specifics for Inpatients (complete in addition to above) Medication sheet sent with patient NA Intravenous medications needed or due during therapy sent with patient NA Drainage tubes (e.g. nasogastric tube or chest tube secured and vented) NA Endotracheal or Tracheotomy tube secured NA Cuff deflated of air and inflated with saline NA Airway suctioned NA Notes Paper version used prior to treatment start. Electronic Signature(s) Signed: 02/10/2022 11:37:48 AM By: [name] EMT Entered By: [name] on 02/10/2022 11:37:48

## 2022-02-10 NOTE — Progress Notes (Addendum)
DERRYL, UHER ([id number]) Visit Report for 02/10/2022 Problem List Details Patient Name: Date of Service: [name], [name] 02/10/2022 8:00 A M Medical Record Number: [id number] Patient Account Number: [id number] Date of Birth/Sex: Treating RN: 10-26-1956 (66 y.o. 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 02/10/2022 Medical Record Number: [id number] Patient Account Number: [id number] Date of Birth/Sex: Treating RN: Oct 17, 1956 (66 y.o. [name] Primary Care Provider: [name] Other Clinician: [name] Referring Provider: Treating Provider/Extender: [name] in Treatment: 4 Diagnosis Coding ICD-10 Codes Code Description N30.41 Irradiation cystitis with hematuria C61 Malignant neoplasm of prostate E11.69 Type 2 diabetes mellitus with other specified complication Facility Procedures CPT4 Code: 32671245 Description: G0277-(Facility Use Only) HBOT full body chamber, 19min , ICD-10 Diagnosis Description N30.41 Irradiation cystitis with hematuria C61 Malignant neoplasm of prostate  E11.69 Type 2 diabetes mellitus with other specified complication Modifier: Quantity: 5 Physician Procedures : CPT4 Code Description Modifier [id number] 82505 - WC PHYS HYPERBARIC OXYGEN THERAPY ICD-10 Diagnosis Description N30.41 Irradiation cystitis with hematuria C61 Malignant neoplasm of prostate E11.69 Type 2 diabetes mellitus with other specified  complication Quantity: 1 Electronic Signature(s) Signed: 02/13/2022 4:33:44 PM By: [name] DO Signed: 02/14/2022 10:22:35 AM By: [name] CHT EMT BS , , Previous Signature: 02/10/2022 11:59:26 AM Version By: [name] EMT Previous Signature: 02/10/2022 2:02:39 PM Version By: [name] DO Entered By: [name] on 02/13/2022 15:42:59

## 2022-02-11 ENCOUNTER — Encounter (HOSPITAL_BASED_OUTPATIENT_CLINIC_OR_DEPARTMENT_OTHER): Payer: 59 | Admitting: Internal Medicine

## 2022-02-11 DIAGNOSIS — N3041 Irradiation cystitis with hematuria: Secondary | ICD-10-CM | POA: Diagnosis not present

## 2022-02-11 LAB — GLUCOSE, CAPILLARY
Glucose-Capillary: 199 mg/dL — ABNORMAL HIGH (ref 70–99)
Glucose-Capillary: 144 mg/dL — ABNORMAL HIGH (ref 70–99)

## 2022-02-11 NOTE — Progress Notes (Signed)
Andrew [name], Andrew [name] ([id number]) Visit Report for 02/11/2022 Problem List Details Patient Name: Date of Service: [name], [name] 02/11/2022 8:00 A M Medical Record Number: [id number] Patient Account Number: [id number] Date of Birth/Sex: Treating RN: [birth year] (67 y.o. 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 02/11/2022 Medical Record Number: [id number] Patient Account Number: [id number] Date of Birth/Sex: Treating RN: 1956-05-19 (66 y.o. [name] Primary Care Provider: [name] Other Clinician: [name] Referring Provider: Treating Provider/Extender: [name] in Treatment: 4 Diagnosis Coding ICD-10 Codes Code Description N30.41 Irradiation cystitis with hematuria C61 Malignant neoplasm of prostate E11.69 Type 2 diabetes mellitus with other specified complication Facility Procedures CPT4 Code: [id number] Description: G0277-(Facility Use Only) HBOT full body chamber, 74min , ICD-10 Diagnosis Description N30.41 Irradiation cystitis with hematuria C61 Malignant neoplasm of prostate E11.69  Type 2 diabetes mellitus with other specified complication Modifier: Quantity: 5 Physician Procedures : CPT4 Code Description Modifier [id number] 65035 - WC PHYS HYPERBARIC OXYGEN THERAPY ICD-10 Diagnosis Description N30.41 Irradiation cystitis with hematuria C61 Malignant neoplasm of prostate E11.69 Type 2 diabetes mellitus with other specified  complication Quantity: 1 Electronic Signature(s) Signed: 02/11/2022 11:52:26 AM By: [name] EMT Signed: 02/11/2022 4:44:59 PM By: Linton Ham MD Entered By: [name] on 02/11/2022 11:52:26

## 2022-02-11 NOTE — Progress Notes (Addendum)
DERRICK, TIEGS ([id number]) Visit Report for 02/11/2022 HBO Details Patient Name: Date of Service: [name], [name] 02/11/2022 8:00 A M Medical Record Number: [id number] Patient Account Number: [id number] Date of Birth/Sex: Treating RN: [birth year] (66 y.o. [name] Primary Care Marua Qin: [name] Other Clinician: [name] Referring Ellianna Ruest: Treating Charmin Aguiniga/Extender: [name] in Treatment: 4 HBO Treatment Course Details Treatment Course Number: 1 Ordering Mileena Rothenberger: [name] T Treatments Ordered: otal 40 HBO Treatment Start Date: 01/20/2022 HBO Indication: Late Effect of Radiation HBO Treatment Details Treatment Number: 16 Patient Type: Outpatient Chamber Type: Monoplace Chamber Serial #: [id number] Treatment Protocol: 2.5 ATA with 90 minutes oxygen, with two 5 minute air breaks Treatment Details Compression Rate Down: 1.0 psi / minute De-Compression Rate Up: 1.0 psi / minute A breaks and breathing ir Compress Tx Pressure periods Decompress Decompress Begins Reached (leave unused spaces Begins Ends blank) Chamber Pressure (ATA 1 2.5 2.5 2.5 2.5 2.5 - - 2.5 1 ) Clock Time (24 hr) 08:10 08:32 09:03 09:07 09:37 09:42 - - 10:12 10:36 Treatment Length: 146 (minutes) Treatment Segments: 5 Vital Signs Capillary Blood Glucose Reference Range: 80 - 120 mg / dl HBO Diabetic Blood Glucose Intervention Range: <131 mg/dl or >249 mg/dl Time Vitals Blood Respiratory Capillary Blood Glucose Pulse Action Type: Pulse: Temperature: Taken: Pressure: Rate: Glucose (mg/dl): Meter #: Oximetry (%) Taken: Pre 08:05 126/78 68 16 98.2 199 Post 10:40 117/74 60 16 97.7 144 Treatment Response Treatment Toleration: Well Treatment Completion Status: Treatment Completed without Adverse Event Treatment Notes Paper version used prior to treatment start. The Patient had no problems with his ears today. Manasvini Whatley Notes no concerns  with treatment given Physician HBO Attestation: I certify that I supervised this HBO treatment in accordance with Medicare guidelines. A trained emergency response team is readily available per Yes hospital policies and procedures. Continue HBOT as ordered. Yes Electronic Signature(s) Signed: 02/11/2022 4:44:59 PM By: Linton Ham MD Previous Signature: 02/11/2022 11:52:04 AM Version By: [name] EMT Entered By: Linton Ham on 02/11/2022 15:51:14 -------------------------------------------------------------------------------- HBO Safety Checklist Details Patient Name: Date of Service: [name], [name] 02/11/2022 8:00 A M Medical Record Number: [id number] Patient Account Number: [id number] Date of Birth/Sex: Treating RN: [birth year] (66 y.o. [name] Primary Care Uzair Godley: [name] Other Clinician: [name] Referring Adriella Essex: Treating Jazzlin Clements/Extender: [name] in Treatment: 4 HBO Safety Checklist Items Safety Checklist Consent Form Signed Patient voided / foley secured and emptied When did you last eato 0630 Last dose of injectable or oral agent After treatment Ostomy pouch emptied and vented if applicable NA All implantable devices assessed, documented and approved NA Intravenous access site secured and place NA Valuables secured Linens and cotton and cotton/polyester blend (less than 51% polyester) Personal oil-based products / skin lotions / body lotions removed Wigs or hairpieces removed NA Smoking or tobacco materials removed Books / newspapers / magazines / loose paper removed Cologne, aftershave, perfume and deodorant removed Jewelry removed (may wrap wedding band) NA Make-up removed NA Hair care products removed Battery operated devices (external) removed Heating patches and chemical warmers removed Titanium eyewear removed NA Nail polish cured greater than 10 hours NA Casting  material cured greater than 10 hours NA Hearing aids removed NA Loose dentures or partials removed NA Prosthetics have been removed NA Patient demonstrates correct use of air break device (if applicable) Patient concerns have been addressed Patient grounding bracelet on and cord attached to chamber  Specifics for Inpatients (complete in addition to above) Medication sheet sent with patient NA Intravenous medications needed or due during therapy sent with patient NA Drainage tubes (e.g. nasogastric tube or chest tube secured and vented) NA Endotracheal or Tracheotomy tube secured NA Cuff deflated of air and inflated with saline NA Airway suctioned NA Notes Paper version used prior to treatment start. Electronic Signature(s) Signed: 02/11/2022 11:49:52 AM By: [name] EMT Entered By: [name] on 02/11/2022 11:49:52

## 2022-02-11 NOTE — Progress Notes (Addendum)
DNAIEL, VOLLER ([id number]) Visit Report for 02/11/2022 Arrival Information Details Patient Name: Date of Service: [name], DO [place] LD E. 02/11/2022 8:00 A M Medical Record Number: [id number] Patient Account Number: [id number] Date of Birth/Sex: Treating RN: 10-13-1956 (66 y.o. [name] Primary Care Tanicka Bisaillon: [name] Other Clinician: [name] Referring Ayeshia Coppin: Treating Enyah Moman/Extender: [name] in Treatment: 4 Visit Information History Since Last Visit All ordered tests and consults were completed: Yes Patient Arrived: Ambulatory Added or deleted any medications: No Arrival Time: 07:46 Any new allergies or adverse reactions: No Accompanied By: None Had a fall or experienced change in No Transfer Assistance: None activities of daily living that may affect Patient Identification Verified: Yes risk of falls: Secondary Verification Process Completed: Yes Signs or symptoms of abuse/neglect since last visito No Patient Requires Transmission-Based Precautions: No Hospitalized since last visit: No Patient Has Alerts: No Implantable device outside of the clinic excluding No cellular tissue based products placed in the center since last visit: Pain Present Now: No Notes Paper version used prior to treatment start. Electronic Signature(s) Signed: 02/11/2022 11:46:35 AM By: [name] EMT Entered By: [name] on 02/11/2022 11:46:35 -------------------------------------------------------------------------------- Encounter Discharge Information Details Patient Name: Date of Service: [name], [name] 02/11/2022 8:00 A M Medical Record Number: [id number] Patient Account Number: [id number] Date of Birth/Sex: Treating RN: 16-May-1956 (66 y.o. [name] Primary Care Hillary Schwegler: [name] Other Clinician: [name] Referring Rocklin Soderquist: Treating Pasha Broad/Extender: [name] in Treatment: 4 Encounter Discharge Information Items Discharge Condition: Stable Ambulatory Status: Ambulatory Discharge Destination: Home Transportation: Private Auto Accompanied By: None Schedule Follow-up Appointment: Yes Clinical Summary of Care: Electronic Signature(s) Signed: 02/11/2022 11:53:16 AM By: [name] EMT Entered By: [name] on 02/11/2022 11:53:16 -------------------------------------------------------------------------------- Patient/Caregiver Education Details Patient Name: Date of Service: [name], [name] 2/21/2023andnbsp8:00 A M Medical Record Number: [id number] Patient Account Number: [id number] Date of Birth/Gender: Treating RN: December 22, 1956 (66 y.o. [name] Primary Care Physician: [name] Other Clinician: [name] Referring Physician: Treating Physician/Extender: [name] in Treatment: 4 Education Assessment Education Provided To: Patient Education Topics Provided Electronic Signature(s) Signed: 02/11/2022 1:41:45 PM By: [name] EMT Entered By: [name] on 02/11/2022 11:52:56 -------------------------------------------------------------------------------- Vitals Details Patient Name: Date of Service: [name], [name] 02/11/2022 8:00 A M Medical Record Number: [id number] Patient Account Number: [id number] Date of Birth/Sex: Treating RN: February 20, 1956 (66 y.o. [name] Primary Care Dianna Ewald: [name] Other Clinician: [name] Referring Aleayah Ch[place]: Treating Aryam Zhan/Extender: [name] in Treatment: 4 Vital Signs Time Taken: 08:05 Temperature (F): 98.2 Height (in): 72 Pulse (bpm): 68 Weight (lbs): 260 Respiratory Rate (breaths/min): 16 Body Mass Index (BMI): 35.3 Blood Pressure (mmHg): 126/78 Capillary Blood Glucose (mg/dl): 199 Reference Range: 80 - 120 mg / dl Notes Paper version used  prior to treatment start. Electronic Signature(s) Signed: 02/11/2022 11:48:39 AM By: [name] EMT Entered By: [name] on 02/11/2022 11:48:39

## 2022-02-12 ENCOUNTER — Encounter (HOSPITAL_BASED_OUTPATIENT_CLINIC_OR_DEPARTMENT_OTHER): Payer: 59 | Admitting: Physician Assistant

## 2022-02-12 ENCOUNTER — Other Ambulatory Visit: Payer: Self-pay

## 2022-02-12 DIAGNOSIS — N3041 Irradiation cystitis with hematuria: Secondary | ICD-10-CM | POA: Diagnosis not present

## 2022-02-12 LAB — GLUCOSE, CAPILLARY
Glucose-Capillary: 209 mg/dL — ABNORMAL HIGH (ref 70–99)
Glucose-Capillary: 170 mg/dL — ABNORMAL HIGH (ref 70–99)

## 2022-02-12 NOTE — Progress Notes (Addendum)
ALECXIS, BALTZELL ([id number]) Visit Report for 02/12/2022 HBO Details Patient Name: Date of Service: [name], [name] 02/12/2022 8:00 A M Medical Record Number: [id number] Patient Account Number: [id number] Date of Birth/Sex: Treating RN: 12/24/1955 (66 y.o. [name] Primary Care Jesselee Poth: [name] Other Clinician: [name] Referring Faizah Kandler: Treating Sarahgrace Broman/Extender: Marrianne Mood in Treatment: 4 HBO Treatment Course Details Treatment Course Number: 1 Ordering Kassius Battiste: [name] T Treatments Ordered: otal 40 HBO Treatment Start Date: 01/20/2022 HBO Indication: Late Effect of Radiation HBO Treatment Details Treatment Number: 17 Patient Type: Outpatient Chamber Type: Monoplace Chamber Serial #: [id number] Treatment Protocol: 2.5 ATA with 90 minutes oxygen, with two 5 minute air breaks Treatment Details Compression Rate Down: 1.0 psi / minute De-Compression Rate Up: 1.0 psi / minute A breaks and breathing ir Compress Tx Pressure periods Decompress Decompress Begins Reached (leave unused spaces Begins Ends blank) Chamber Pressure (ATA 1 2.5 2.5 2.5 2.5 2.5 - - 2.5 1 ) Clock Time (24 hr) 08:08 08:31 09:01 09:06 09:36 09:41 - - 10:11 10:35 Treatment Length: 147 (minutes) Treatment Segments: 5 Vital Signs Capillary Blood Glucose Reference Range: 80 - 120 mg / dl HBO Diabetic Blood Glucose Intervention Range: <131 mg/dl or >249 mg/dl Time Vitals Blood Respiratory Capillary Blood Glucose Pulse Action Type: Pulse: Temperature: Taken: Pressure: Rate: Glucose (mg/dl): Meter #: Oximetry (%) Taken: Pre 08:05 125/80 74 18 98.2 209 Post 10:38 108/75 63 16 97.8 170 Treatment Response Treatment Toleration: Well Treatment Completion Status: Treatment Completed without Adverse Event Treatment Notes Paper version used prior to treatment start. No problems were noted today, the patient stated no ear problems. Electronic  Signature(s) Signed: 02/12/2022 11:42:01 AM By: [name] EMT Signed: 02/12/2022 4:57:32 PM By: Worthy Keeler PA-C Entered By: [name] on 02/12/2022 11:42:01 -------------------------------------------------------------------------------- HBO Safety Checklist Details Patient Name: Date of Service: [name], [name] 02/12/2022 8:00 A M Medical Record Number: [id number] Patient Account Number: [id number] Date of Birth/Sex: Treating RN: 11-01-1956 (66 y.o. [name] Primary Care Aurora Rody: [name] Other Clinician: [name] Referring Ronnisha Felber: Treating Genesee Nase/Extender: Marrianne Mood in Treatment: 4 HBO Safety Checklist Items Safety Checklist Consent Form Signed Patient voided / foley secured and emptied When did you last eato 0630 Last dose of injectable or oral agent After treatment Ostomy pouch emptied and vented if applicable NA All implantable devices assessed, documented and approved NA Intravenous access site secured and place NA Valuables secured Linens and cotton and cotton/polyester blend (less than 51% polyester) Personal oil-based products / skin lotions / body lotions removed Wigs or hairpieces removed NA Smoking or tobacco materials removed Books / newspapers / magazines / loose paper removed Cologne, aftershave, perfume and deodorant removed Jewelry removed (may wrap wedding band) NA Make-up removed NA Hair care products removed NA Battery operated devices (external) removed Heating patches and chemical warmers removed Titanium eyewear removed NA Nail polish cured greater than 10 hours NA Casting material cured greater than 10 hours NA Hearing aids removed NA Loose dentures or partials removed NA Prosthetics have been removed NA Patient demonstrates correct use of air break device (if applicable) Patient concerns have been addressed Patient grounding bracelet on and cord attached to  chamber Specifics for Inpatients (complete in addition to above) Medication sheet sent with patient NA Intravenous medications needed or due during therapy sent with patient NA Drainage tubes (e.g. nasogastric tube or chest tube secured and vented) NA Endotracheal  or Tracheotomy tube secured NA Cuff deflated of air and inflated with saline NA Airway suctioned NA Notes Paper version used prior to treatment start. Electronic Signature(s) Signed: 02/12/2022 11:39:38 AM By: [name] EMT Entered By: [name] on 02/12/2022 11:39:38

## 2022-02-12 NOTE — Progress Notes (Signed)
RIMAS, GILHAM ([id number]) Visit Report for 02/12/2022 Problem List Details Patient Name: Date of Service: Andrew Estimable, Andrew NA LD E. 02/12/2022 8:00 A M Medical Record Number: [id number] Patient Account Number: [id number] Date of Birth/Sex: Treating RN: [name], 1956 (66 y.o. 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 02/12/2022 Medical Record Number: [id number] Patient Account Number: [id number] Date of Birth/Sex: Treating RN: August 14, 1956 (67 y.o. [name] Primary Care Provider: [name] Other Clinician: [name] Referring Provider: Treating Provider/Extender: Marrianne Mood in Treatment: 4 Diagnosis Coding ICD-10 Codes Code Description N30.41 Irradiation cystitis with hematuria C61 Malignant neoplasm of prostate E11.69 Type 2 diabetes mellitus with other specified complication Facility Procedures CPT4 Code: [id number] Description: G0277-(Facility Use Only) HBOT full body chamber, 13min , ICD-10 Diagnosis Description N30.41 Irradiation cystitis with hematuria C61 Malignant neoplasm of prostate  E11.69 Type 2 diabetes mellitus with other specified complication Modifier: Quantity: 5 Physician Procedures : CPT4 Code Description Modifier [id number] - WC PHYS HYPERBARIC OXYGEN THERAPY ICD-10 Diagnosis Description N30.41 Irradiation cystitis with hematuria C61 Malignant neoplasm of prostate E11.69 Type 2 diabetes mellitus with other specified  complication Quantity: 1 Electronic Signature(s) Signed: 02/12/2022 11:42:24 AM By: [name] EMT Signed: 02/12/2022 4:57:32 PM By: Worthy Keeler PA-C Entered By: [name] on 02/12/2022 11:42:23

## 2022-02-12 NOTE — Progress Notes (Addendum)
WILBURN, KEIR ([id number]) Visit Report for 02/12/2022 Arrival Information Details Patient Name: Date of Service: Andrew Estimable, DO [place] LD E. 02/12/2022 8:00 A M Medical Record Number: [id number] Patient Account Number: [id number] Date of Birth/Sex: Treating RN: 1956-01-30 (66 y.o. [name] Primary Care Meckenzie Balsley: [name] Other Clinician: [name] Referring Clotilda Hafer: Treating Darwin Rothlisberger/Extender: Marrianne Mood in Treatment: 4 Visit Information History Since Last Visit All ordered tests and consults were completed: Yes Patient Arrived: Ambulatory Added or deleted any medications: No Arrival Time: 07:53 Any new allergies or adverse reactions: No Accompanied By: None Had a fall or experienced change in No Transfer Assistance: None activities of daily living that may affect Patient Identification Verified: Yes risk of falls: Secondary Verification Process Completed: Yes Signs or symptoms of abuse/neglect since last visito No Patient Requires Transmission-Based Precautions: No Hospitalized since last visit: No Patient Has Alerts: No Implantable device outside of the clinic excluding No cellular tissue based products placed in the center since last visit: Pain Present Now: No Notes Paper version used prior to treatment start. Electronic Signature(s) Signed: 02/12/2022 11:37:11 AM By: [name] EMT Entered By: [name] on 02/12/2022 11:37:11 -------------------------------------------------------------------------------- Encounter Discharge Information Details Patient Name: Date of Service: Andrew Estimable, [name] 02/12/2022 8:00 A M Medical Record Number: [id number] Patient Account Number: [id number] Date of Birth/Sex: Treating RN: 1956-11-10 (66 y.o. [name] Primary Care Saniyya Gau: [name] Other Clinician: [name] Referring Basheer Molchan: Treating Shoua Ulloa/Extender: Marrianne Mood in Treatment: 4 Encounter Discharge Information Items Discharge Condition: Stable Ambulatory Status: Ambulatory Discharge Destination: Home Transportation: Private Auto Accompanied By: None Schedule Follow-up Appointment: Yes Clinical Summary of Care: Electronic Signature(s) Signed: 02/12/2022 11:43:14 AM By: [name] EMT Entered By: [name] on 02/12/2022 11:43:14 -------------------------------------------------------------------------------- Patient/Caregiver Education Details Patient Name: Date of Service: Andrew Estimable, [name] 2/22/2023andnbsp8:00 A M Medical Record Number: [id number] Patient Account Number: [id number] Date of Birth/Gender: Treating RN: 1956-12-19 (66 y.o. [name] Primary Care Physician: [name] Other Clinician: [name] Referring Physician: Treating Physician/Extender: Marrianne Mood in Treatment: 4 Education Assessment Education Provided To: Patient Education Topics Provided Electronic Signature(s) Signed: 02/12/2022 3:00:26 PM By: [name] EMT Entered By: [name] on 02/12/2022 11:42:52 -------------------------------------------------------------------------------- Vitals Details Patient Name: Date of Service: Andrew Estimable, [name] 02/12/2022 8:00 A M Medical Record Number: [id number] Patient Account Number: [id number] Date of Birth/Sex: Treating RN: 05/22/1956 (66 y.o. [name] Primary Care Sayyid Harewood: [name] Other Clinician: [name] Referring Jaymien Landin: Treating Tanganika Barradas/Extender: Marrianne Mood in Treatment: 4 Vital Signs Time Taken: 08:05 Temperature (F): 98.2 Height (in): 72 Pulse (bpm): 74 Weight (lbs): 260 Respiratory Rate (breaths/min): 18 Body Mass Index (BMI): 35.3 Blood Pressure (mmHg): 125/80 Capillary Blood Glucose (mg/dl): 209 Reference Range: 80 - 120 mg / dl Electronic  Signature(s) Signed: 02/12/2022 11:38:30 AM By: [name] EMT Entered By: [name] on 02/12/2022 11:38:30

## 2022-02-13 ENCOUNTER — Encounter (HOSPITAL_BASED_OUTPATIENT_CLINIC_OR_DEPARTMENT_OTHER): Payer: 59 | Admitting: Internal Medicine

## 2022-02-13 DIAGNOSIS — N3041 Irradiation cystitis with hematuria: Secondary | ICD-10-CM | POA: Diagnosis not present

## 2022-02-13 LAB — GLUCOSE, CAPILLARY
Glucose-Capillary: 216 mg/dL — ABNORMAL HIGH (ref 70–99)
Glucose-Capillary: 187 mg/dL — ABNORMAL HIGH (ref 70–99)

## 2022-02-13 NOTE — Progress Notes (Addendum)
Andrew [name], Andrew [name] ([id number]) Visit Report for 02/13/2022 HBO Details Patient Name: Date of Service: Andrew Estimable, [name] 02/13/2022 8:00 A M Medical Record Number: [id number] Patient Account Number: [id number] Date of Birth/Sex: Treating RN: [birth year] (66 y.o. Andrew [name] Primary Care Amaiyah Nordhoff: [name] Other Clinician: [name] Referring Arisbel Maione: Treating Saide Lanuza/Extender: [name] in Treatment: 4 HBO Treatment Course Details Treatment Course Number: 1 Ordering Shandon Burlingame: [name] T Treatments Ordered: otal 40 HBO Treatment Start Date: 01/20/2022 HBO Indication: Late Effect of Radiation HBO Treatment Details Treatment Number: 18 Patient Type: Outpatient Chamber Type: Monoplace Chamber Serial #: [id number] Treatment Protocol: 2.5 ATA with 90 minutes oxygen, with two 5 minute air breaks Treatment Details Compression Rate Down: 1.0 psi / minute De-Compression Rate Up: 1.0 psi / minute A breaks and breathing ir Compress Tx Pressure periods Decompress Decompress Begins Reached (leave unused spaces Begins Ends blank) Chamber Pressure (ATA 1 2.5 2.5 2.5 2.5 2.5 - - 2.5 1 ) Clock Time (24 hr) 08:05 08:27 08:57 09:02 09:32 09:37 - - 10:07 10:31 Treatment Length: 146 (minutes) Treatment Segments: 5 Vital Signs Capillary Blood Glucose Reference Range: 80 - 120 mg / dl HBO Diabetic Blood Glucose Intervention Range: <131 mg/dl or >249 mg/dl Time Vitals Blood Respiratory Capillary Blood Glucose Pulse Action Type: Pulse: Temperature: Taken: Pressure: Rate: Glucose (mg/dl): Meter #: Oximetry (%) Taken: Pre 08:00 108/71 66 16 97.8 216 Post 10:36 112/86 67 18 97.8 187 Treatment Response Treatment Toleration: Well Treatment Completion Status: Treatment Completed without Adverse Event Treatment Notes Paper version used prior to treatment start. No problems were noted today, I asked the patient if he had any discomfort in his  ears, he stated no. Ellason Segar Notes no concerns with treatment given Physician HBO Attestation: I certify that I supervised this HBO treatment in accordance with Medicare guidelines. A trained emergency response team is readily available per Yes hospital policies and procedures. Continue HBOT as ordered. Yes Electronic Signature(s) Signed: 02/13/2022 5:03:55 PM By: Linton Ham MD Previous Signature: 02/13/2022 11:06:16 AM Version By: [name] EMT Entered By: Linton Ham on 02/13/2022 15:15:08 -------------------------------------------------------------------------------- HBO Safety Checklist Details Patient Name: Date of Service: Andrew Estimable, [name] 02/13/2022 8:00 A M Medical Record Number: [id number] Patient Account Number: [id number] Date of Birth/Sex: Treating RN: Jan 08, 1956 (66 y.o. Andrew [name] Primary Care Syla Devoss: [name] Other Clinician: [name] Referring Tomasa Dobransky: Treating Laken Rog/Extender: [name] in Treatment: 4 HBO Safety Checklist Items Safety Checklist Consent Form Signed Patient voided / foley secured and emptied When did you last eato 0630 Last dose of injectable or oral agent After treatment Ostomy pouch emptied and vented if applicable NA All implantable devices assessed, documented and approved NA Intravenous access site secured and place NA Valuables secured Linens and cotton and cotton/polyester blend (less than 51% polyester) Personal oil-based products / skin lotions / body lotions removed Wigs or hairpieces removed NA Smoking or tobacco materials removed Books / newspapers / magazines / loose paper removed Cologne, aftershave, perfume and deodorant removed Jewelry removed (may wrap wedding band) NA Make-up removed NA Hair care products removed Battery operated devices (external) removed Heating patches and chemical warmers removed Titanium eyewear removed NA Nail polish  cured greater than 10 hours NA Casting material cured greater than 10 hours NA Hearing aids removed NA Loose dentures or partials removed NA Prosthetics have been removed NA Patient demonstrates correct use of air break device (if applicable) Patient concerns have  been addressed Patient grounding bracelet on and cord attached to chamber Specifics for Inpatients (complete in addition to above) Medication sheet sent with patient NA Intravenous medications needed or due during therapy sent with patient NA Drainage tubes (e.g. nasogastric tube or chest tube secured and vented) NA Endotracheal or Tracheotomy tube secured NA Cuff deflated of air and inflated with saline NA Airway suctioned NA Notes Paper version used prior to treatment start. Electronic Signature(s) Signed: 02/13/2022 8:46:09 AM By: [name] EMT Entered By: [name] on 02/13/2022 08:46:09

## 2022-02-13 NOTE — Progress Notes (Addendum)
BRAISON, SNOKE ([id number]) Visit Report for 02/13/2022 Arrival Information Details Patient Name: Date of Service: [name], DO [place] LD E. 02/13/2022 8:00 A M Medical Record Number: [id number] Patient Account Number: [id number] Date of Birth/Sex: Treating RN: 03/29/1956 (66 y.o. [name] Primary Care Merriel Zinger: [name] Other Clinician: [name] Referring Kenneth Lax: Treating Momina Hunton/Extender: [name] in Treatment: 4 Visit Information History Since Last Visit All ordered tests and consults were completed: Yes Patient Arrived: Ambulatory Added or deleted any medications: No Arrival Time: 07:48 Any new allergies or adverse reactions: No Accompanied By: None Had a fall or experienced change in No Transfer Assistance: None activities of daily living that may affect Patient Identification Verified: Yes risk of falls: Secondary Verification Process Completed: Yes Signs or symptoms of abuse/neglect since last visito No Patient Requires Transmission-Based Precautions: No Hospitalized since last visit: No Patient Has Alerts: No Implantable device outside of the clinic excluding No cellular tissue based products placed in the center since last visit: Pain Present Now: No Notes Paper version used prior to treatment start. Electronic Signature(s) Signed: 02/13/2022 8:43:52 AM By: [name] EMT Entered By: [name] on 02/13/2022 08:43:51 -------------------------------------------------------------------------------- Encounter Discharge Information Details Patient Name: Date of Service: [name], [name] 02/13/2022 8:00 A M Medical Record Number: [id number] Patient Account Number: [id number] Date of Birth/Sex: Treating RN: September 28, 1956 (66 y.o. [name] Primary Care Luria Rosario: [name] Other Clinician: [name] Referring Katalin Colledge: Treating Reve Crocket/Extender: [name] in Treatment: 4 Encounter Discharge Information Items Discharge Condition: Stable Ambulatory Status: Ambulatory Discharge Destination: Home Transportation: Ambulance Accompanied By: None Schedule Follow-up Appointment: Yes Clinical Summary of Care: Electronic Signature(s) Signed: 02/13/2022 11:08:48 AM By: [name] EMT Entered By: [name] on 02/13/2022 11:08:48 -------------------------------------------------------------------------------- Patient/Caregiver Education Details Patient Name: Date of Service: [name], [name] 2/23/2023andnbsp8:00 A M Medical Record Number: [id number] Patient Account Number: [id number] Date of Birth/Gender: Treating RN: [birth year] (66 y.o. [name] Primary Care Physician: [name] Other Clinician: [name] Referring Physician: Treating Physician/Extender: [name] in Treatment: 4 Education Assessment Education Provided To: Patient Education Topics Provided Electronic Signature(s) Signed: 02/13/2022 3:37:46 PM By: [name] EMT Entered By: [name] on 02/13/2022 11:08:29 -------------------------------------------------------------------------------- Vitals Details Patient Name: Date of Service: [name], [name] 02/13/2022 8:00 A M Medical Record Number: [id number] Patient Account Number: [id number] Date of Birth/Sex: Treating RN: 1956/05/08 (66 y.o. [name] Primary Care Asharia Lotter: [name] Other Clinician: [name] Referring Tupac Jeffus: Treating Florabelle Cardin/Extender: [name] in Treatment: 4 Vital Signs Time Taken: 08:00 Temperature (F): 97.8 Height (in): 72 Pulse (bpm): 66 Weight (lbs): 260 Respiratory Rate (breaths/min): 16 Body Mass Index (BMI): 35.3 Blood Pressure (mmHg): 108/71 Capillary Blood Glucose (mg/dl): 216 Reference Range: 80 - 120 mg / dl Notes Paper version used  prior to treatment start. Electronic Signature(s) Signed: 02/13/2022 8:44:48 AM By: [name] EMT Entered By: [name] on 02/13/2022 08:44:48

## 2022-02-13 NOTE — Progress Notes (Addendum)
Andrew [name], Andrew [name] ([id number]) Visit Report for 02/13/2022 Problem List Details Patient Name: Date of Service: [name], [name] 02/13/2022 8:00 A M Medical Record Number: [id number] Patient Account Number: [id number] Date of Birth/Sex: Treating RN: 04-22-1956 (66 y.o. 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 02/13/2022 Medical Record Number: [id number] Patient Account Number: [id number] Date of Birth/Sex: Treating RN: 18-Nov-1956 (66 y.o. [name] Primary Care Provider: [name] Other Clinician: [name] Referring Provider: Treating Provider/Extender: [name] in Treatment: 4 Diagnosis Coding ICD-10 Codes Code Description N30.41 Irradiation cystitis with hematuria Z92.3 Personal history of irradiation C61 Malignant neoplasm of prostate E11.69 Type 2 diabetes mellitus with other specified complication Facility Procedures CPT4 Code: [id number] Description: G0277-(Facility Use Only) HBOT full body chamber, 76min , ICD-10 Diagnosis Description N30.41 Irradiation cystitis with hematuria  Z92.3 Personal history of irradiation C61 Malignant neoplasm of prostate E11.69 Type 2 diabetes mellitus with  other specified complication Modifier: Quantity: 5 Physician Procedures : CPT4 Code Description Modifier [id number] - WC PHYS HYPERBARIC OXYGEN THERAPY ICD-10 Diagnosis Description N30.41 Irradiation cystitis with hematuria Z92.3 Personal history of irradiation C61 Malignant neoplasm of prostate E11.69 Type 2 diabetes  mellitus with other specified complication Quantity: 1 Electronic Signature(s) Signed: 02/13/2022 5:26:32 PM By: [name] CHT EMT BS , , Signed: 02/18/2022 4:13:40 PM By: Linton Ham MD Previous Signature: 02/13/2022 11:06:51 AM Version By: [name] EMT Previous Signature: 02/13/2022 5:03:55 PM Version By: Linton Ham MD Entered By: [name] on 02/13/2022 17:26:32

## 2022-02-14 ENCOUNTER — Other Ambulatory Visit: Payer: Self-pay

## 2022-02-14 ENCOUNTER — Encounter (HOSPITAL_BASED_OUTPATIENT_CLINIC_OR_DEPARTMENT_OTHER): Payer: 59 | Admitting: Internal Medicine

## 2022-02-14 DIAGNOSIS — N3041 Irradiation cystitis with hematuria: Secondary | ICD-10-CM | POA: Diagnosis not present

## 2022-02-14 DIAGNOSIS — C61 Malignant neoplasm of prostate: Secondary | ICD-10-CM | POA: Diagnosis not present

## 2022-02-14 DIAGNOSIS — E1169 Type 2 diabetes mellitus with other specified complication: Secondary | ICD-10-CM | POA: Diagnosis not present

## 2022-02-14 DIAGNOSIS — Z923 Personal history of irradiation: Secondary | ICD-10-CM | POA: Diagnosis not present

## 2022-02-14 LAB — GLUCOSE, CAPILLARY
Glucose-Capillary: 174 mg/dL — ABNORMAL HIGH (ref 70–99)
Glucose-Capillary: 146 mg/dL — ABNORMAL HIGH (ref 70–99)

## 2022-02-14 NOTE — Progress Notes (Addendum)
Andrew [name], Andrew [name] ([id number]) Visit Report for 02/14/2022 HBO Details Patient Name: Date of Service: Andrew Estimable, [name] 02/14/2022 8:00 A M Medical Record Number: [id number] Patient Account Number: [id number] Date of Birth/Sex: Treating RN: Feb 11, 1956 (66 y.o. [name] Primary Care [name]: [name] Other Clinician: [name] Referring [name]: Treating [name]/Extender: [name] in Treatment: 4 HBO Treatment Course Details Treatment Course Number: 1 Ordering Ashwini Jago: [name] T Treatments Ordered: otal 40 HBO Treatment Start Date: 01/20/2022 HBO Indication: Late Effect of Radiation HBO Treatment Details Treatment Number: 19 Patient Type: Outpatient Chamber Type: Monoplace Chamber Serial #: [id number] Treatment Protocol: 2.5 ATA with 90 minutes oxygen, with two 5 minute air breaks Treatment Details Compression Rate Down: 1.0 psi / minute De-Compression Rate Up: 1.0 psi / minute A breaks and breathing ir Compress Tx Pressure periods Decompress Decompress Begins Reached (leave unused spaces Begins Ends blank) Chamber Pressure (ATA 1 2.5 2.5 2.5 2.5 2.5 - - 2.5 1 ) Clock Time (24 hr) 08:06 08:31 09:01 09:06 09:36 09:41 - - 10:11 10:35 Treatment Length: 149 (minutes) Treatment Segments: 5 Vital Signs Capillary Blood Glucose Reference Range: 80 - 120 mg / dl HBO Diabetic Blood Glucose Intervention Range: <131 mg/dl or >249 mg/dl Time Vitals Blood Respiratory Capillary Blood Glucose Pulse Action Type: Pulse: Temperature: Taken: Pressure: Rate: Glucose (mg/dl): Meter #: Oximetry (%) Taken: Pre 08:03 132/86 68 16 98.1 174 Post 10:38 124/76 53 14 146 Treatment Response Treatment Toleration: Well Treatment Completion Status: Treatment Completed without Adverse Event Treatment Notes The patient did well today, and no problems were noted. Physician HBO Attestation: I certify that I supervised this HBO treatment  in accordance with Medicare guidelines. A trained emergency response team is readily available per Yes hospital policies and procedures. Continue HBOT as ordered. Yes Electronic Signature(s) Signed: 02/14/2022 12:18:49 PM By: [name] DO Previous Signature: 02/14/2022 10:46:10 AM Version By: [name] EMT Previous Signature: 02/14/2022 10:12:13 AM Version By: [name] EMT Previous Signature: 02/14/2022 9:42:36 AM Version By: [name] EMT Previous Signature: 02/14/2022 9:37:26 AM Version By: [name] EMT Previous Signature: 02/14/2022 9:07:02 AM Version By: [name] EMT Previous Signature: 02/14/2022 9:02:27 AM Version By: [name] EMT Previous Signature: 02/14/2022 9:02:00 AM Version By: [name] EMT Previous Signature: 02/14/2022 8:12:25 AM Version By: [name] EMT Entered By: [name] on 02/14/2022 12:18:05 -------------------------------------------------------------------------------- HBO Safety Checklist Details Patient Name: Date of Service: Andrew Estimable, [name] 02/14/2022 8:00 A M Medical Record Number: [id number] Patient Account Number: [id number] Date of Birth/Sex: Treating RN: 1956/12/14 (66 y.o. [name] Primary Care [name]: [name] Other Clinician: [name] Referring [name]: Treating [name]/Extender: [name] in Treatment: 4 HBO Safety Checklist Items Safety Checklist Consent Form Signed Patient voided / foley secured and emptied When did you last eato 0630 Last dose of injectable or oral agent After treatment Ostomy pouch emptied and vented if applicable NA All implantable devices assessed, documented and approved NA Intravenous access site secured and place NA Valuables secured Linens and cotton and cotton/polyester blend (less than 51% polyester) Personal oil-based products / skin lotions / body lotions removed Wigs or hairpieces  removed NA Smoking or tobacco materials removed Books / newspapers / magazines / loose paper removed Cologne, aftershave, perfume and deodorant removed Jewelry removed (may wrap wedding band) NA Make-up removed NA Hair care products removed NA Battery operated devices (external) removed Heating patches and chemical warmers removed Titanium eyewear  removed NA Nail polish cured greater than 10 hours NA Casting material cured greater than 10 hours NA Hearing aids removed NA Loose dentures or partials removed NA Prosthetics have been removed NA Patient demonstrates correct use of air break device (if applicable) Patient concerns have been addressed Patient grounding bracelet on and cord attached to chamber Specifics for Inpatients (complete in addition to above) Medication sheet sent with patient NA Intravenous medications needed or due during therapy sent with patient NA Drainage tubes (e.g. nasogastric tube or chest tube secured and vented) NA Endotracheal or Tracheotomy tube secured NA Cuff deflated of air and inflated with saline NA Airway suctioned NA Electronic Signature(s) Signed: 02/14/2022 8:11:38 AM By: [name] EMT Entered By: [name] on 02/14/2022 08:11:37

## 2022-02-14 NOTE — Progress Notes (Signed)
Andrew [name], Andrew [name] ([id number]) Visit Report for 02/14/2022 Problem List Details Patient Name: Date of Service: [name], [name] 02/14/2022 8:00 A M Medical Record Number: [id number] Patient Account Number: [id number] Date of Birth/Sex: Treating RN: 1956-09-26 (66 y.o. 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 02/14/2022 Medical Record Number: [id number] Patient Account Number: [id number] Date of Birth/Sex: Treating RN: 1956-11-30 (66 y.o. [name] Primary Care Provider: [name] Other Clinician: [name] Referring Provider: Treating Provider/Extender: [name] in Treatment: 4 Diagnosis Coding ICD-10 Codes Code Description N30.41 Irradiation cystitis with hematuria Z92.3 Personal history of irradiation C61 Malignant neoplasm of prostate E11.69 Type 2 diabetes mellitus with other specified complication Facility Procedures CPT4 Code: [id number] Description: G0277-(Facility Use Only) HBOT full body chamber, 79min , ICD-10 Diagnosis Description N30.41 Irradiation cystitis with hematuria  Z92.3 Personal history of irradiation C61 Malignant neoplasm of prostate E11.69 Type 2 diabetes mellitus with  other specified complication Modifier: Quantity: 5 Physician Procedures : CPT4 Code Description Modifier [id number] 25003 - WC PHYS HYPERBARIC OXYGEN THERAPY ICD-10 Diagnosis Description N30.41 Irradiation cystitis with hematuria Z92.3 Personal history of irradiation C61 Malignant neoplasm of prostate E11.69 Type 2 diabetes  mellitus with other specified complication Quantity: 1 Electronic Signature(s) Signed: 02/14/2022 10:46:55 AM By: [name] EMT Signed: 02/14/2022 12:18:49 PM By: [name] DO Entered By: [name] on 02/14/2022 10:46:55

## 2022-02-14 NOTE — Progress Notes (Addendum)
CLOUD, Andrew [name] ([id number]) Visit Report for 02/14/2022 Arrival Information Details Patient Name: Date of Service: [name], DO [place] LD E. 02/14/2022 8:00 A M Medical Record Number: [id number] Patient Account Number: [id number] Date of Birth/Sex: Treating RN: 1956-05-14 (66 y.o. Andrew [name], [name] Primary Care Beda Dula: [name] Other Clinician: [name] Referring Fendi Meinhardt: Treating Pavneet Markwood/Extender: [name] in Treatment: 4 Visit Information History Since Last Visit All ordered tests and consults were completed: Yes Patient Arrived: Ambulatory Added or deleted any medications: No Arrival Time: 07:49 Any new allergies or adverse reactions: No Accompanied By: None Had a fall or experienced change in No Transfer Assistance: None activities of daily living that may affect Patient Identification Verified: Yes risk of falls: Secondary Verification Process Completed: Yes Signs or symptoms of abuse/neglect since last visito No Patient Requires Transmission-Based Precautions: No Hospitalized since last visit: No Patient Has Alerts: No Implantable device outside of the clinic excluding No cellular tissue based products placed in the center since last visit: Pain Present Now: No Electronic Signature(s) Signed: 02/14/2022 8:07:34 AM By: [name] EMT Previous Signature: 02/14/2022 7:58:48 AM Version By: [name] EMT Entered By: [name] on 02/14/2022 08:07:34 -------------------------------------------------------------------------------- Encounter Discharge Information Details Patient Name: Date of Service: [name], [name] 02/14/2022 8:00 A M Medical Record Number: [id number] Patient Account Number: [id number] Date of Birth/Sex: Treating RN: 05/23/1956 (66 y.o. Andrew [name] Primary Care Tim Corriher: [name] Other Clinician: [name] Referring Roselynne Lortz: Treating Kahlee Metivier/Extender: [name] in Treatment: 4 Encounter Discharge Information Items Discharge Condition: Stable Ambulatory Status: Ambulatory Discharge Destination: Home Transportation: Private Auto Accompanied By: None Schedule Follow-up Appointment: Yes Clinical Summary of Care: Electronic Signature(s) Signed: 02/14/2022 10:51:39 AM By: [name] EMT Entered By: [name] on 02/14/2022 10:51:39 -------------------------------------------------------------------------------- Patient/Caregiver Education Details Patient Name: Date of Service: [name], [name] 2/24/2023andnbsp8:00 Helper Record Number: [id number] Patient Account Number: [id number] Date of Birth/Gender: Treating RN: 1956-12-11 (66 y.o. Andrew [name] Primary Care Physician: [name] Other Clinician: [name] Referring Physician: Treating Physician/Extender: [name] in Treatment: 4 Education Assessment Education Provided To: Patient Education Topics Provided Electronic Signature(s) Signed: 02/14/2022 2:47:11 PM By: [name] EMT Entered By: [name] on 02/14/2022 10:47:32 -------------------------------------------------------------------------------- Vitals Details Patient Name: Date of Service: [name], [name] 02/14/2022 8:00 A M Medical Record Number: [id number] Patient Account Number: [id number] Date of Birth/Sex: Treating RN: 02-Apr-1956 (66 y.o. Andrew [name] Primary Care Yoni Lobos: [name] Other Clinician: [name] Referring Daijon Wenke: Treating Abdoulie Tierce/Extender: [name] in Treatment: 4 Vital Signs Time Taken: 08:03 Temperature (F): 98.1 Height (in): 72 Pulse (bpm): 68 Weight (lbs): 260 Respiratory Rate (breaths/min): 16 Body Mass Index (BMI): 35.3 Blood Pressure (mmHg): 132/86 Capillary Blood Glucose (mg/dl): 174 Reference Range: 80 - 120 mg /  dl Electronic Signature(s) Signed: 02/14/2022 8:10:33 AM By: [name] EMT Entered By: [name] on 02/14/2022 08:10:33

## 2022-02-17 ENCOUNTER — Other Ambulatory Visit: Payer: Self-pay

## 2022-02-17 ENCOUNTER — Encounter (HOSPITAL_BASED_OUTPATIENT_CLINIC_OR_DEPARTMENT_OTHER): Payer: 59 | Admitting: Internal Medicine

## 2022-02-17 DIAGNOSIS — C61 Malignant neoplasm of prostate: Secondary | ICD-10-CM | POA: Diagnosis not present

## 2022-02-17 DIAGNOSIS — E1169 Type 2 diabetes mellitus with other specified complication: Secondary | ICD-10-CM | POA: Diagnosis not present

## 2022-02-17 DIAGNOSIS — N3041 Irradiation cystitis with hematuria: Secondary | ICD-10-CM

## 2022-02-17 LAB — GLUCOSE, CAPILLARY
Glucose-Capillary: 166 mg/dL — ABNORMAL HIGH (ref 70–99)
Glucose-Capillary: 145 mg/dL — ABNORMAL HIGH (ref 70–99)

## 2022-02-17 NOTE — Progress Notes (Signed)
Andrew [name], Andrew [name] ([id number]) Visit Report for 02/17/2022 HBO Details Patient Name: Date of Service: Andrew Estimable, [name] 02/17/2022 8:00 A M Medical Record Number: [id number] Patient Account Number: [id number] Date of Birth/Sex: Treating RN: 01/14/1956 (66 y.o. Andrew [name] Primary Care Ashwin Tibbs: [name] Other Clinician: [name] Referring Ioan Landini: Treating Josiyah Tozzi/Extender: [name] in Treatment: 5 HBO Treatment Course Details Treatment Course Number: 1 Ordering Glennie Bose: [name] T Treatments Ordered: otal 40 HBO Treatment Start Date: 01/20/2022 HBO Indication: Late Effect of Radiation HBO Treatment Details Treatment Number: 20 Patient Type: Outpatient Chamber Type: Monoplace Chamber Serial #: [id number] Treatment Protocol: 2.5 ATA with 90 minutes oxygen, with two 5 minute air breaks Treatment Details Compression Rate Down: 1.0 psi / minute De-Compression Rate Up: 2.0 psi / minute A breaks and breathing ir Compress Tx Pressure periods Decompress Decompress Begins Reached (leave unused spaces Begins Ends blank) Chamber Pressure (ATA 1 2.5 2.5 2.5 2.5 2.5 - - 2.5 1 ) Clock Time (24 hr) 08:04 08:21 08:51 08:56 09:27 09:32 - - 10:02 10:28 Treatment Length: 144 (minutes) Treatment Segments: 5 Vital Signs Capillary Blood Glucose Reference Range: 80 - 120 mg / dl HBO Diabetic Blood Glucose Intervention Range: <131 mg/dl or >249 mg/dl Time Vitals Blood Respiratory Capillary Blood Glucose Pulse Action Type: Pulse: Temperature: Taken: Pressure: Rate: Glucose (mg/dl): Meter #: Oximetry (%) Taken: Pre 07:57 140/88 72 22 98 166 2 Post 10:30 138/89 59 18 97.8 145 2 Treatment Response Treatment Toleration: Well Treatment Completion Status: Treatment Completed without Adverse Event Treatment Notes Placed patient safely after performing safety checklist. Pressurized chamber at a rate of 1 psi/min until reaching 10 psi  pressure. At that time compression rate was increased to approximately 1.25 psi/min until reaching 2 ATA at which time the compression rate was increased to 1.5 psi/min until reaching treatment depth of 2.5 ATA. Patient stated there were no problems during travel. During decompression chamber was decompressed at a rate of 1.5 until reaching 2 ATA. At that time travel rate was decreased to 1.0 psi/min. Upon reaching 7 psi patient stated that his left ear was not equalizing correctly. Increased pressure to 9 psi. Upon reaching 8.5 psi patient stated ear was clear. Decompression was restarted after decreasing the flow rate to the lowest setting. This caused the travel rate to be 1 psi per 1.233 minutes which is a travel rate of approximately 0.81 psi/min. At ambient pressure patient stated that his ear was still clogged up. Dr. Heber Tulia was contacted to examine the patient's ears. Patient was referred to [facility] for 1515 today (02/17/22) with Dr. Fredric Dine, referral was faxed. Electronic Signature(s) Signed: 02/17/2022 11:45:05 AM By: [name] DO Signed: 02/17/2022 2:10:41 PM By: [name] CHT EMT BS , , Previous Signature: 02/17/2022 11:09:59 AM Version By: [name] DO Previous Signature: 02/17/2022 10:52:08 AM Version By: [name] DO Entered By: [name] on 02/17/2022 11:13:24 -------------------------------------------------------------------------------- HBO Safety Checklist Details Patient Name: Date of Service: Andrew Estimable, [name] 02/17/2022 8:00 A M Medical Record Number: [id number] Patient Account Number: [id number] Date of Birth/Sex: Treating RN: 17-Aug-1956 (66 y.o. Andrew [name] Primary Care Dalila Arca: [name] Other Clinician: [name] Referring Terita Hejl: Treating Antwan Bribiesca/Extender: [name] in Treatment: 5 HBO Safety Checklist Items Safety Checklist Consent Form Signed Patient voided /  foley secured and emptied When did you last eato 0630 Last dose of injectable or oral agent Yesterday Ostomy pouch emptied and vented if  applicable NA All implantable devices assessed, documented and approved NA Intravenous access site secured and place NA Valuables secured Linens and cotton and cotton/polyester blend (less than 51% polyester) Personal oil-based products / skin lotions / body lotions removed Wigs or hairpieces removed NA Smoking or tobacco materials removed NA Books / newspapers / magazines / loose paper removed Cologne, aftershave, perfume and deodorant removed Jewelry removed (may wrap wedding band) Make-up removed NA Hair care products removed Battery operated devices (external) removed Heating patches and chemical warmers removed Titanium eyewear removed Nail polish cured greater than 10 hours NA Casting material cured greater than 10 hours NA Hearing aids removed NA Loose dentures or partials removed NA Prosthetics have been removed NA Patient demonstrates correct use of air break device (if applicable) Patient concerns have been addressed Patient grounding bracelet on and cord attached to chamber Specifics for Inpatients (complete in addition to above) Medication sheet sent with patient NA Intravenous medications needed or due during therapy sent with patient NA Drainage tubes (e.g. nasogastric tube or chest tube secured and vented) NA Endotracheal or Tracheotomy tube secured NA Cuff deflated of air and inflated with saline NA Airway suctioned NA Notes Paper version used prior to treatment. Electronic Signature(s) Signed: 02/17/2022 2:10:41 PM By: [name] CHT EMT BS , , Entered By: [name] on 02/17/2022 09:30:36

## 2022-02-17 NOTE — Progress Notes (Signed)
KINGSLY, KLOEPFER ([id number]) Visit Report for 02/17/2022 SuperBill Details Patient Name: Date of Service: [name], [name] 02/17/2022 Medical Record Number: [id number] Patient Account Number: [id number] Date of Birth/Sex: Treating RN: Mar 12, 1956 (66 y.o. [name] Primary Care Provider: [name] Other Clinician: [name] Referring Provider: Treating Provider/Extender: [name] in Treatment: 5 Diagnosis Coding ICD-10 Codes Code Description N30.41 Irradiation cystitis with hematuria C61 Malignant neoplasm of prostate E11.69 Type 2 diabetes mellitus with other specified complication Facility Procedures CPT4 Code Description Modifier Quantity [id number] G0277-(Facility Use Only) HBOT full body chamber, 28min , 5 ICD-10 Diagnosis Description N30.41 Irradiation cystitis with hematuria C61 Malignant neoplasm of prostate E11.69 Type 2 diabetes mellitus with other specified complication Physician Procedures Quantity CPT4 Code Description Modifier [id number] 18563 - WC PHYS HYPERBARIC OXYGEN THERAPY 1 ICD-10 Diagnosis Description N30.41 Irradiation cystitis with hematuria C61 Malignant neoplasm of prostate E11.69 Type 2 diabetes mellitus with other specified complication Electronic Signature(s) Signed: 02/17/2022 11:09:59 AM By: [name] DO Signed: 02/17/2022 2:10:41 PM By: [name] CHT EMT BS , , Entered By: [name] on 02/17/2022 10:54:08

## 2022-02-17 NOTE — Progress Notes (Signed)
LANORRIS, KALISZ ([id number]) Visit Report for 02/17/2022 Arrival Information Details Patient Name: Date of Service: [name], DO [place] LD E. 02/17/2022 8:00 A M Medical Record Number: [id number] Patient Account Number: [id number] Date of Birth/Sex: Treating RN: 1956-07-17 (66 y.o. [name] Primary Care Adeena Bernabe: [name] Other Clinician: [name] Referring Talisa Petrak: Treating Ayrianna Mcginniss/Extender: [name] in Treatment: 5 Visit Information History Since Last Visit All ordered tests and consults were completed: Yes Patient Arrived: Ambulatory Added or deleted any medications: No Arrival Time: 07:30 Any new allergies or adverse reactions: No Accompanied By: self Had a fall or experienced change in No Transfer Assistance: None activities of daily living that may affect Patient Identification Verified: Yes risk of falls: Secondary Verification Process Completed: Yes Signs or symptoms of abuse/neglect since last visito No Patient Requires Transmission-Based Precautions: No Hospitalized since last visit: No Patient Has Alerts: No Implantable device outside of the clinic excluding No cellular tissue based products placed in the center since last visit: Pain Present Now: No Electronic Signature(s) Signed: 02/17/2022 2:10:41 PM By: [name] CHT EMT BS , , Entered By: [name] on 02/17/2022 13:24:40 -------------------------------------------------------------------------------- Encounter Discharge Information Details Patient Name: Date of Service: [name], [name] 02/17/2022 8:00 A M Medical Record Number: [id number] Patient Account Number: [id number] Date of Birth/Sex: Treating RN: 1956/10/12 (66 y.o. [name] Primary Care Doyce Saling: [name] Other Clinician: [name] Referring Zaahir Pickney: Treating Daimion Adamcik/Extender: [name] in Treatment: 5 Encounter  Discharge Information Items Discharge Condition: Stable Ambulatory Status: Ambulatory Discharge Destination: Home Transportation: Private Auto Accompanied By: self Schedule Follow-up Appointment: No Clinical Summary of Care: Electronic Signature(s) Signed: 02/17/2022 2:10:41 PM By: [name] CHT EMT BS , , Entered By: [name] on 02/17/2022 10:54:48 -------------------------------------------------------------------------------- [place] Details Patient Name: Date of Service: [name], [name] 02/17/2022 8:00 A M Medical Record Number: [id number] Patient Account Number: [id number] Date of Birth/Sex: Treating RN: May 19, 1956 (66 y.o. [name] Primary Care Krishika Bugge: [name] Other Clinician: [name] Referring Varvara Legault: Treating Talik Casique/Extender: [name] in Treatment: 5 Vital Signs Time Taken: 07:57 Temperature (F): 98.0 Height (in): 72 Pulse (bpm): 72 Weight (lbs): 260 Respiratory Rate (breaths/min): 22 Body Mass Index (BMI): 35.3 Blood Pressure (mmHg): 140/88 Capillary Blood Glucose (mg/dl): 166 Reference Range: 80 - 120 mg / dl Electronic Signature(s) Signed: 02/17/2022 2:10:41 PM By: [name] CHT EMT BS , , Entered By: [name] on 02/17/2022 09:29:17

## 2022-02-18 ENCOUNTER — Encounter (HOSPITAL_BASED_OUTPATIENT_CLINIC_OR_DEPARTMENT_OTHER): Payer: 59 | Admitting: Internal Medicine

## 2022-02-18 DIAGNOSIS — C61 Malignant neoplasm of prostate: Secondary | ICD-10-CM

## 2022-02-18 DIAGNOSIS — E1159 Type 2 diabetes mellitus with other circulatory complications: Secondary | ICD-10-CM

## 2022-02-18 DIAGNOSIS — N3041 Irradiation cystitis with hematuria: Secondary | ICD-10-CM | POA: Diagnosis not present

## 2022-02-18 DIAGNOSIS — I10 Essential (primary) hypertension: Secondary | ICD-10-CM | POA: Diagnosis not present

## 2022-02-18 LAB — GLUCOSE, CAPILLARY
Glucose-Capillary: 170 mg/dL — ABNORMAL HIGH (ref 70–99)
Glucose-Capillary: 159 mg/dL — ABNORMAL HIGH (ref 70–99)

## 2022-02-18 NOTE — Progress Notes (Addendum)
[name], [name] ([id number]) Visit Report for 02/18/2022 Arrival Information Details Patient Name: Date of Service: [name], DO [place] LD E. 02/18/2022 8:15 A M Medical Record Number: [id number] Patient Account Number: [id number] Date of Birth/Sex: Treating RN: [birth year] (66 y.o. Andrew Eva, Briant [name] Primary Care Airabella Barley: [name] Other Clinician: [name] Referring Syan Cullimore: Treating Latesia Norrington/Extender: [name] in Treatment: 5 Visit Information History Since Last Visit All ordered tests and consults were completed: Yes Patient Arrived: Ambulatory Added or deleted any medications: No Arrival Time: 07:45 Any new allergies or adverse reactions: No Accompanied By: None Had a fall or experienced change in No Transfer Assistance: None activities of daily living that may affect Patient Identification Verified: Yes risk of falls: Secondary Verification Process Completed: Yes Signs or symptoms of abuse/neglect since last visito No Patient Requires Transmission-Based Precautions: No Hospitalized since last visit: No Patient Has Alerts: No Implantable device outside of the clinic excluding No cellular tissue based products placed in the center since last visit: Pain Present Now: No Electronic Signature(s) Signed: 02/18/2022 12:13:40 PM By: [name] EMT Previous Signature: 02/18/2022 9:14:58 AM Version By: [name] EMT Entered By: [name] on 02/18/2022 12:13:39 -------------------------------------------------------------------------------- Encounter Discharge Information Details Patient Name: Date of Service: [name], [name] 02/18/2022 8:15 A M Medical Record Number: [id number] Patient Account Number: [id number] Date of Birth/Sex: Treating RN: 03/15/1956 (66 y.o. Andrew [name] Primary Care Mayme Profeta: [name] Other Clinician: [name] Referring Dameshia Seybold: Treating Ericca Labra/Extender: [name] in Treatment: 5 Encounter Discharge Information Items Discharge Condition: Stable Ambulatory Status: Ambulatory Discharge Destination: Home Transportation: Private Auto Accompanied By: None Schedule Follow-up Appointment: Yes Clinical Summary of Care: Electronic Signature(s) Signed: 02/18/2022 12:15:45 PM By: [name] EMT Entered By: [name] on 02/18/2022 12:15:45 -------------------------------------------------------------------------------- Patient/Caregiver Education Details Patient Name: Date of Service: [name], [name] 2/28/2023andnbsp8:15 A M Medical Record Number: [id number] Patient Account Number: [id number] Date of Birth/Gender: Treating RN: 08/03/1956 (66 y.o. Andrew [name] Primary Care Physician: [name] Other Clinician: [name] Referring Physician: Treating Physician/Extender: [name] in Treatment: 5 Education Assessment Education Provided To: Patient Education Topics Provided Electronic Signature(s) Signed: 02/18/2022 12:27:41 PM By: [name] EMT Entered By: [name] on 02/18/2022 12:15:20 -------------------------------------------------------------------------------- Vitals Details Patient Name: Date of Service: [name], [name] 02/18/2022 8:15 A M Medical Record Number: [id number] Patient Account Number: [id number] Date of Birth/Sex: Treating RN: [birth year] (66 y.o. Andrew [name] Primary Care Argusta Mcgann: [name] Other Clinician: [name] Referring Tiahna Cure: Treating Miguelina Fore/Extender: [name] in Treatment: 5 Vital Signs Time Taken: 07:52 Temperature (F): 98.5 Height (in): 72 Pulse (bpm): 73 Weight (lbs): 260 Respiratory Rate (breaths/min): 22 Body Mass Index (BMI): 35.3 Blood Pressure (mmHg): 132/79 Reference Range: 80 - 120 mg / dl Electronic Signature(s) Signed:  02/18/2022 9:16:06 AM By: [name] EMT Previous Signature: 02/18/2022 9:15:54 AM Version By: [name] EMT Entered By: [name] on 02/18/2022 09:16:06

## 2022-02-18 NOTE — Progress Notes (Addendum)
LUCIAN, BASWELL ([id number]) Visit Report for 02/18/2022 HBO Details Patient Name: Date of Service: [name], [name] 02/18/2022 8:15 A M Medical Record Number: [id number] Patient Account Number: [id number] Date of Birth/Sex: Treating RN: 08/05/1956 (66 y.o. [name] Primary Care Ricarda Atayde: [name] Other Clinician: [name] Referring Marnette Perkins: Treating Lash Matulich/Extender: [name] in Treatment: 5 HBO Treatment Course Details Treatment Course Number: 1 Ordering Undrea Shipes: [name] T Treatments Ordered: otal 40 HBO Treatment Start Date: 01/20/2022 HBO Indication: Late Effect of Radiation HBO Treatment Details Treatment Number: 21 Patient Type: Outpatient Chamber Type: Monoplace Chamber Serial #: [id number] Treatment Protocol: 2.5 ATA with 90 minutes oxygen, with two 5 minute air breaks Treatment Details Compression Rate Down: 1.0 psi / minute De-Compression Rate Up: 1.0 psi / minute A breaks and breathing ir Compress Tx Pressure periods Decompress Decompress Begins Reached (leave unused spaces Begins Ends blank) Chamber Pressure (ATA 1 2.5 2.5 2.5 2.5 2.5 - - 2.5 1 ) Clock Time (24 hr) 09:01 09:23 09:53 09:58 10:28 10:33 - - 11:03 11:28 Treatment Length: 147 (minutes) Treatment Segments: 5 Vital Signs Capillary Blood Glucose Reference Range: 80 - 120 mg / dl HBO Diabetic Blood Glucose Intervention Range: <131 mg/dl or >249 mg/dl Time Vitals Blood Respiratory Capillary Blood Glucose Pulse Action Type: Pulse: Temperature: Taken: Pressure: Rate: Glucose (mg/dl): Meter #: Oximetry (%) Taken: Pre 07:52 132/79 73 22 98.5 Pre 08:51 170 Post 116/85 55 18 97.8 159 Treatment Response Treatment Toleration: Well Treatment Completion Status: Treatment Completed without Adverse Event Treatment Notes The patient had no problems clearing his ears today. Physician HBO Attestation: I certify that I supervised this HBO  treatment in accordance with Medicare guidelines. A trained emergency response team is readily available per Yes hospital policies and procedures. Continue HBOT as ordered. Yes Electronic Signature(s) Signed: 02/18/2022 3:53:03 PM By: [name] DO Previous Signature: 02/18/2022 12:17:48 PM Version By: [name] EMT Previous Signature: 02/18/2022 11:35:26 AM Version By: [name] EMT Previous Signature: 02/18/2022 11:25:18 AM Version By: [name] EMT Previous Signature: 02/18/2022 9:24:35 AM Version By: [name] EMT Previous Signature: 02/18/2022 9:30:01 AM Version By: [name] DO Entered By: [name] on 02/18/2022 15:50:45 -------------------------------------------------------------------------------- HBO Safety Checklist Details Patient Name: Date of Service: [name], [name] 02/18/2022 8:15 A M Medical Record Number: [id number] Patient Account Number: [id number] Date of Birth/Sex: Treating RN: 04-18-1956 (66 y.o. [name] Primary Care Pratyush Ammon: [name] Other Clinician: [name] Referring Eriel Doyon: Treating Janiel Crisostomo/Extender: [name] in Treatment: 5 HBO Safety Checklist Items Safety Checklist Consent Form Signed Patient voided / foley secured and emptied When did you last eato 0630 Last dose of injectable or oral agent After treatment Ostomy pouch emptied and vented if applicable NA All implantable devices assessed, documented and approved NA Intravenous access site secured and place NA Valuables secured Linens and cotton and cotton/polyester blend (less than 51% polyester) Personal oil-based products / skin lotions / body lotions removed Wigs or hairpieces removed NA Smoking or tobacco materials removed Books / newspapers / magazines / loose paper removed Cologne, aftershave, perfume and deodorant removed Jewelry removed (may wrap wedding band) Make-up  removed NA Hair care products removed Battery operated devices (external) removed Heating patches and chemical warmers removed Titanium eyewear removed NA Nail polish cured greater than 10 hours NA Casting material cured greater than 10 hours NA Hearing aids removed NA Loose dentures or partials removed NA Prosthetics have been  removed NA Patient demonstrates correct use of air break device (if applicable) Patient concerns have been addressed Patient grounding bracelet on and cord attached to chamber Specifics for Inpatients (complete in addition to above) Medication sheet sent with patient NA Intravenous medications needed or due during therapy sent with patient NA Drainage tubes (e.g. nasogastric tube or chest tube secured and vented) NA Endotracheal or Tracheotomy tube secured NA Cuff deflated of air and inflated with saline NA Airway suctioned NA Notes Paper version used prior to treatment. The patient was seen by Dr. Fredric Dine ENT yesterday, about his ear pain. Dr. Birdena Jubilee cleared him to return to HBO treatment. Dr. Heber Weston was given a copy of the notes from Dr. Birdena Jubilee. Dr. Heber [place] saw the patient in the wound center prior to today's HBO treatment. Electronic Signature(s) Signed: 02/18/2022 9:23:04 AM By: [name] EMT Entered By: [name] on 02/18/2022 09:23:04

## 2022-02-18 NOTE — Progress Notes (Addendum)
Andrew [name], Andrew [name] ([id number]) Visit Report for 02/18/2022 Arrival Information Details Patient Name: Date of Service: Andrew Estimable, DO [place] LD E. 02/18/2022 7:30 A M Medical Record Number: [id number] Patient Account Number: [id number] Date of Birth/Sex: Treating RN: [birth year] (66 y.o. M) Primary Care Provider: Vincente [name] Other Clinician: Referring Provider: Treating Provider/Extender: [name] in Treatment: 5 Visit Information History Since Last Visit Added or deleted any medications: No Patient Arrived: Ambulatory Any new allergies or adverse reactions: No Arrival Time: 07:52 Had a fall or experienced change in No Accompanied By: self activities of daily living that may affect Transfer Assistance: None risk of falls: Patient Identification Verified: Yes Signs or symptoms of abuse/neglect since last visito No Secondary Verification Process Completed: Yes Hospitalized since last visit: No Patient Requires Transmission-Based Precautions: No Implantable device outside of the clinic excluding No Patient Has Alerts: No cellular tissue based products placed in the center since last visit: Has Dressing in Place as Prescribed: Yes Pain Present Now: No Electronic Signature(s) Signed: 02/18/2022 7:53:36 AM By: Sandre Kitty Entered By: Sandre Kitty on 02/18/2022 07:52:45 -------------------------------------------------------------------------------- Clinic Level of Care Assessment Details Patient Name: Date of Service: Andrew Estimable, DO [place] LD E. 02/18/2022 7:30 A M Medical Record Number: [id number] Patient Account Number: [id number] Date of Birth/Sex: Treating RN: 10/26/1956 (66 y.o. Andrew [name] Primary Care Provider: Vincente [name] Other Clinician: Referring Provider: Treating Provider/Extender: [name] in Treatment: 5 Clinic Level of Care Assessment Items TOOL 4 Quantity Score X- 1 0 Use when only an EandM  is performed on FOLLOW-UP visit ASSESSMENTS - Nursing Assessment / Reassessment X- 1 10 Reassessment of Co-morbidities (includes updates in patient status) X- 1 5 Reassessment of Adherence to Treatment Plan ASSESSMENTS - Wound and Skin A ssessment / Reassessment [] - 0 Simple Wound Assessment / Reassessment - one wound [] - 0 Complex Wound Assessment / Reassessment - multiple wounds [] - 0 Dermatologic / Skin Assessment (not related to wound area) ASSESSMENTS - Focused Assessment [] - 0 Circumferential Edema Measurements - multi extremities [] - 0 Nutritional Assessment / Counseling / Intervention [] - 0 Lower Extremity Assessment (monofilament, tuning fork, pulses) [] - 0 Peripheral Arterial Disease Assessment (using hand held doppler) ASSESSMENTS - Ostomy and/or Continence Assessment and Care [] - 0 Incontinence Assessment and Management [] - 0 Ostomy Care Assessment and Management (repouching, etc.) PROCESS - Coordination of Care X - Simple Patient / Family Education for ongoing care 1 15 [] - 0 Complex (extensive) Patient / Family Education for ongoing care X- 1 10 Staff obtains Programmer, systems, Records, T Results / Process Orders est [] - 0 Staff telephones HHA, Nursing Homes / Clarify orders / etc [] - 0 Routine Transfer to another Facility (non-emergent condition) [] - 0 Routine Hospital Admission (non-emergent condition) [] - 0 New Admissions / Biomedical engineer / Ordering NPWT Apligraf, etc. , [] - 0 Emergency Hospital Admission (emergent condition) X- 1 10 Simple Discharge Coordination [] - 0 Complex (extensive) Discharge Coordination PROCESS - Special Needs [] - 0 Pediatric / Minor Patient Management [] - 0 Isolation Patient Management [] - 0 Hearing / Language / Visual special needs [] - 0 Assessment of Community assistance (transportation, D/C planning, etc.) [] - 0 Additional assistance / Altered mentation [] - 0 Support Surface(s) Assessment  (bed, cushion, seat, etc.) INTERVENTIONS - Wound Cleansing / Measurement [] - 0 Simple Wound Cleansing - one wound [] - 0 Complex Wound Cleansing - multiple wounds [] -  0 Wound Imaging (photographs - any number of wounds) [] - 0 Wound Tracing (instead of photographs) [] - 0 Simple Wound Measurement - one wound [] - 0 Complex Wound Measurement - multiple wounds INTERVENTIONS - Wound Dressings [] - 0 Small Wound Dressing one or multiple wounds [] - 0 Medium Wound Dressing one or multiple wounds [] - 0 Large Wound Dressing one or multiple wounds [] - 0 Application of Medications - topical [] - 0 Application of Medications - injection INTERVENTIONS - Miscellaneous X- 1 5 External ear exam [] - 0 Specimen Collection (cultures, biopsies, blood, body fluids, etc.) [] - 0 Specimen(s) / Culture(s) sent or taken to Lab for analysis [] - 0 Patient Transfer (multiple staff / Civil Service fast streamer / Similar devices) [] - 0 Simple Staple / Suture removal (25 or less) [] - 0 Complex Staple / Suture removal (26 or more) [] - 0 Hypo / Hyperglycemic Management (close monitor of Blood Glucose) [] - 0 Ankle / Brachial Index (ABI) - do not check if billed separately X- 1 5 Vital Signs Has the patient been seen at the hospital within the last three years: Yes Total Score: 60 Level Of Care: New/Established - Level 2 Electronic Signature(s) Signed: 02/18/2022 3:24:27 PM By: Deon Pilling RN, BSN Entered By: Deon Pilling on 02/18/2022 08:40:38 -------------------------------------------------------------------------------- Encounter Discharge Information Details Patient Name: Date of Service: Andrew Estimable, [name] 02/18/2022 7:30 A M Medical Record Number: [id number] Patient Account Number: [id number] Date of Birth/Sex: Treating RN: 03-21-1956 (66 y.o. Andrew [name] Primary Care Provider: Vincente [name] Other Clinician: Referring Provider: Treating Provider/Extender: [name] in Treatment: 5 Encounter Discharge Information Items Discharge Condition: Stable Ambulatory Status: Ambulatory Discharge Destination: Other (Note Required) Telephoned: No Orders Sent: No Transportation: Private Auto Accompanied By: self Schedule Follow-up Appointment: Yes Clinical Summary of Care: Notes HBO treatment Electronic Signature(s) Signed: 02/18/2022 3:24:27 PM By: Deon Pilling RN, BSN Entered By: Deon Pilling on 02/18/2022 08:41:17 -------------------------------------------------------------------------------- Lower Extremity Assessment Details Patient Name: Date of Service: Andrew Estimable, [name] 02/18/2022 7:30 A M Medical Record Number: [id number] Patient Account Number: [id number] Date of Birth/Sex: Treating RN: 04/06/1956 (66 y.o. Andrew [name] Primary Care Provider: Vincente [name] Other Clinician: Referring Provider: Treating Provider/Extender: [name] in Treatment: 5 Electronic Signature(s) Signed: 02/18/2022 3:24:27 PM By: Deon Pilling RN, BSN Entered By: Deon Pilling on 02/18/2022 07:55:17 -------------------------------------------------------------------------------- Multi Wound Chart Details Patient Name: Date of Service: Andrew Estimable, [name] 02/18/2022 7:30 A M Medical Record Number: [id number] Patient Account Number: [id number] Date of Birth/Sex: Treating RN: 1956-03-01 (67 y.o. M) Primary Care Provider: Vincente [name] Other Clinician: Referring Provider: Treating Provider/Extender: [name] in Treatment: 5 Vital Signs Height(in): 72 Pulse(bpm): 73 Weight(lbs): 260 Blood Pressure(mmHg): 132/79 Body Mass Index(BMI): 35.3 Temperature(F): 98.5 Respiratory Rate(breaths/min): 22 Wound Assessments Treatment Notes Electronic Signature(s) Signed: 02/18/2022 9:30:01 AM By: [name] DO Entered By: [name] on 02/18/2022  09:17:59 -------------------------------------------------------------------------------- Multi-Disciplinary Care Plan Details Patient Name: Date of Service: Andrew Estimable, [name] 02/18/2022 7:30 A M Medical Record Number: [id number] Patient Account Number: [id number] Date of Birth/Sex: Treating RN: 27-May-1956 (67 y.o. Andrew [name] Primary Care Provider: Vincente [name] Other Clinician: Referring Provider: Treating Provider/Extender: [name] in Treatment: 5 Multidisciplinary Care Plan reviewed with physician Active Inactive HBO Nursing Diagnoses: Anxiety related to feelings of confinement associated with the hyperbaric oxygen chamber Anxiety related to knowledge  deficit of hyperbaric oxygen therapy and treatment procedures Discomfort related to temperature and humidity changes inside hyperbaric chamber Potential for barotraumas to ears, sinuses, teeth, and lungs or cerebral gas embolism related to changes in atmospheric pressure inside hyperbaric oxygen chamber Potential for oxygen toxicity seizures related to delivery of 100% oxygen at an increased atmospheric pressure Potential for pulmonary oxygen toxicity related to delivery of 100% oxygen at an increased atmospheric pressure Goals: Barotrauma will be prevented during HBO2 Date Initiated: 01/13/2022 Target Resolution Date: 04/11/2022 Goal Status: Active Patient and/or family will be able to state/discuss factors appropriate to the management of their disease process during treatment Date Initiated: 01/13/2022 Date Inactivated: 02/18/2022 Target Resolution Date: 03/14/2022 Goal Status: Met Patient will tolerate the hyperbaric oxygen therapy treatment Date Initiated: 01/13/2022 Date Inactivated: 02/18/2022 Target Resolution Date: 03/14/2022 Goal Status: Met Patient will tolerate the internal climate of the chamber Date Initiated: 01/13/2022 T arget Resolution Date: 04/10/2022 Goal Status:  Active Patient/caregiver will verbalize understanding of HBO goals, rationale, procedures and potential hazards Date Initiated: 01/13/2022 T arget Resolution Date: 03/14/2022 Goal Status: Active Signs and symptoms of pulmonary oxygen toxicity will be recognized and promptly addressed Date Initiated: 01/13/2022 T arget Resolution Date: 04/11/2022 Goal Status: Active Signs and symptoms of seizure will be recognized and promptly addressed ; seizing patients will suffer no harm Date Initiated: 01/13/2022 T arget Resolution Date: 04/11/2022 Goal Status: Active Interventions: Administer a five (5) minute air break for patient if signs and symptoms of seizure appear and notify the hyperbaric physician Administer decongestants, per physician orders, prior to HBO2 Administer the correct therapeutic gas delivery based on the patients needs and limitations, per physician order Assess and provide for patients comfort related to the hyperbaric environment and equalization of middle ear Assess for signs and symptoms related to adverse events, including but not limited to confinement anxiety, pneumothorax, oxygen toxicity and baurotrauma Assess patient for any history of confinement anxiety Assess patient's knowledge and expectations regarding hyperbaric medicine and provide education related to the hyperbaric environment, goals of treatment and prevention of adverse events Implement protocols to decrease risk of pneumothorax in high risk patients Notes: Electronic Signature(s) Signed: 02/18/2022 3:24:27 PM By: Deon Pilling RN, BSN Entered By: Deon Pilling on 02/18/2022 07:56:05 -------------------------------------------------------------------------------- Pain Assessment Details Patient Name: Date of Service: Andrew Estimable, [name] 02/18/2022 7:30 A M Medical Record Number: [id number] Patient Account Number: [id number] Date of Birth/Sex: Treating RN: [birth year] (66 y.o. M) Primary Care Provider:  Vincente [name] Other Clinician: Referring Provider: Treating Provider/Extender: [name] in Treatment: 5 Active Problems Location of Pain Severity and Description of Pain Patient Has Paino No Site Locations Pain Management and Medication Current Pain Management: Electronic Signature(s) Signed: 02/18/2022 7:53:36 AM By: Sandre Kitty Entered By: Sandre Kitty on 02/18/2022 07:53:16 -------------------------------------------------------------------------------- Patient/Caregiver Education Details Patient Name: Date of Service: Andrew Estimable, [name] 2/28/2023andnbsp7:30 A M Medical Record Number: [id number] Patient Account Number: [id number] Date of Birth/Gender: Treating RN: 1956/10/24 (66 y.o. Andrew [name] Primary Care Physician: Andrew [name] Other Clinician: Referring Physician: Treating Physician/Extender: [name] in Treatment: 5 Education Assessment Education Provided To: Patient Education Topics Provided Wound/Skin Impairment: Handouts: Skin Care Do's and Dont's Methods: Explain/Verbal Responses: Reinforcements needed Electronic Signature(s) Signed: 02/18/2022 3:24:27 PM By: Deon Pilling RN, BSN Entered By: Deon Pilling on 02/18/2022 07:56:19 -------------------------------------------------------------------------------- Vitals Details Patient Name: Date of Service: Andrew Estimable, [name] 02/18/2022 7:30 A M Medical Record Number: [id number] Patient Account  Number: [id number] Date of Birth/Sex: Treating RN: [birth year] (66 y.o. M) Primary Care Jemima Petko: Andrew [name] Other Clinician: Referring Fae Blossom: Treating Martavious Hartel/Extender: [name] in Treatment: 5 Vital Signs Time Taken: 07:52 Temperature (F): 98.5 Height (in): 72 Pulse (bpm): 73 Weight (lbs): 260 Respiratory Rate (breaths/min): 22 Body Mass Index (BMI): 35.3 Blood Pressure  (mmHg): 132/79 Reference Range: 80 - 120 mg / dl Electronic Signature(s) Signed: 02/18/2022 7:53:36 AM By: Sandre Kitty Entered By: Sandre Kitty on 02/18/2022 07:53:07

## 2022-02-18 NOTE — Progress Notes (Signed)
Andrew [name], Andrew [name] ([id number]) Visit Report for 02/18/2022 Problem List Details Patient Name: Date of Service: [name], [name] 02/18/2022 8:15 A M Medical Record Number: [id number] Patient Account Number: [id number] Date of Birth/Sex: Treating RN: 03-16-1956 (66 y.o. 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 02/18/2022 Medical Record Number: [id number] Patient Account Number: [id number] Date of Birth/Sex: Treating RN: [birth year] (66 y.o. 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

## 2022-02-18 NOTE — Progress Notes (Signed)
OLANDO, WILLEMS ([id number]) Visit Report for 02/18/2022 Chief Complaint Document Details Patient Name: Date of Service: Andrew Estimable, Andrew [name] [place] LD E. 02/18/2022 7:30 A M Medical Record Number: [id number] Patient Account Number: [id number] Date of Birth/Sex: Treating RN: 04/25/1956 (66 y.o. M) Primary Care Provider: [name] Other Clinician: Referring Provider: Treating Provider/Extender: [name] in Treatment: 5 Information Obtained from: Patient Chief Complaint Discussed possible HBO treatment for Hematuria secondary to radiation cystitis Electronic Signature(s) Signed: 02/18/2022 9:30:01 AM By: [name] Andrew [name] Entered By: [name] on 02/18/2022 09:18:13 -------------------------------------------------------------------------------- HPI Details Patient Name: Date of Service: Andrew Estimable, Andrew [name] NA LD E. 02/18/2022 7:30 A M Medical Record Number: [id number] Patient Account Number: [id number] Date of Birth/Sex: Treating RN: 05-31-1956 (66 y.o. M) Primary Care Provider: [name] Other Clinician: Referring Provider: Treating Provider/Extender: [name] in Treatment: 5 History of Present Illness HPI Description: Mr. Arlin Sass is a 66 year old male with a past medical history of prostate cancer, hypertension and type 2 diabetes that presents to the clinic to discuss HBO therapy for hematuria secondary to radiation cystitis. He was referred to our clinic by his urologist Dr. Link Snuffer. On 09/12/2019 he underwent brachytherapy seed implant and spaceOAR placement with Dr. Gloriann Loan and Dr. Tammi Klippel. He had been doing well up until November 2022 when he developed painless gross hematuria With clots. He had a cystoscopy He states that the symptoms have waxed and waned over the past several months. He reports no improvement in his symptoms. He currently denies signs of infection. 2/28; patient presents for 30-day eval.  He has completed 20 out of 40 HBO treatments for radiation cystitis. He reports doing well with HBO and has noticed an improvement in his symptoms. He reports that he has not seen any hematuria. He denies dysuria or increased frequency in urination. Yesterday after HBO therapy he noticed muffling in the left ear. I evaluated him at that time and noticed that there was fluid behind the ear. He had tubes placed about 2 weeks ago. I recommended he see his ENT He followed up that afternoon with them. At that time the tube was repositioned and he was started on Ciprodex . drops. He was cleared to continue HBO therapy. He reports that there is no more muffling. He denies ear pain. Electronic Signature(s) Signed: 02/18/2022 9:30:01 AM By: [name] Andrew [name] Entered By: [name] on 02/18/2022 09:20:56 -------------------------------------------------------------------------------- Physical Exam Details Patient Name: Date of Service: Andrew Estimable, Andrew [name] NA LD E. 02/18/2022 7:30 A M Medical Record Number: [id number] Patient Account Number: [id number] Date of Birth/Sex: Treating RN: 1956-07-06 (66 y.o. M) Primary Care Provider: [name] Other Clinician: Referring Provider: Treating Provider/Extender: [name] in Treatment: 5 Constitutional respirations regular, non-labored and within target range for patient.Marland Kitchen Respiratory normal breathing without difficulty. Cardiovascular regular rate and rhythm with normal S1, S2. Psychiatric pleasant and cooperative. Notes Ear: Right ear with tympanic membrane visualized with no fluid present. Left ear: Tube noticed position correctly. No more fluid present Electronic Signature(s) Signed: 02/18/2022 9:30:01 AM By: [name] Andrew [name] Entered By: [name] on 02/18/2022 09:22:02 -------------------------------------------------------------------------------- Physician Orders Details Patient Name: Date of  Service: Andrew Estimable, Andrew [name] NA LD E. 02/18/2022 7:30 A M Medical Record Number: [id number] Patient Account Number: [id number] Date of Birth/Sex: Treating RN: 06/14/1956 (66 y.o. [name] Primary Care Provider: [name] Other Clinician: Referring Provider: Treating Provider/Extender: [name] in Treatment: 5 Verbal / Phone Orders: No  Diagnosis Coding ICD-10 Coding Code Description C61 Malignant neoplasm of prostate 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 K44 Essential (primary) hypertension Follow-up Appointments Other: - Continue hyberbaric oxygen therapy as prescribed. Electronic Signature(s) Signed: 02/18/2022 9:30:01 AM By: [name] Andrew [name] Entered By: [name] on 02/18/2022 09:23:46 -------------------------------------------------------------------------------- Problem List Details Patient Name: Date of Service: Andrew Estimable, Andrew [name] NA LD E. 02/18/2022 7:30 A M Medical Record Number: [id number] Patient Account Number: [id number] Date of Birth/Sex: Treating RN: 1956-09-10 (66 y.o. [name] Primary Care Provider: [name] Other Clinician: Referring Provider: Treating Provider/Extender: [name] in Treatment: 5 Active Problems ICD-10 Encounter Code Description Active Date MDM Diagnosis C61 Malignant neoplasm of prostate 01/13/2022 No Yes N30.41 Irradiation cystitis with hematuria 01/13/2022 No Yes 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 6/44/0347 No Yes I10 Essential (primary) hypertension 01/13/2022 No Yes Inactive Problems Resolved Problems Electronic Signature(s) Signed: 02/18/2022 9:30:01 AM By: [name] Andrew [name] Entered By: [name] on 02/18/2022 09:17:55 -------------------------------------------------------------------------------- Progress Note Details Patient  Name: Date of Service: Andrew Estimable, Andrew [name] NA LD E. 02/18/2022 7:30 A M Medical Record Number: [id number] Patient Account Number: [id number] Date of Birth/Sex: Treating RN: [birth year] (66 y.o. M) Primary Care Provider: [name] Other Clinician: Referring Provider: Treating Provider/Extender: [name] in Treatment: 5 Subjective Chief Complaint Information obtained from Patient Discussed possible HBO treatment for Hematuria secondary to radiation cystitis History of Present Illness (HPI) Mr. Hernando Reali is a 66 year old male with a past medical history of prostate cancer, hypertension and type 2 diabetes that presents to the clinic to discuss HBO therapy for hematuria secondary to radiation cystitis. He was referred to our clinic by his urologist Dr. Link Snuffer. On 09/12/2019 he underwent brachytherapy seed implant and spaceOAR placement with Dr. Gloriann Loan and Dr. Tammi Klippel. He had been doing well up until November 2022 when he developed painless gross hematuria With clots. He had a cystoscopy He states that the symptoms have waxed and waned over the past several months. He reports no improvement in his symptoms. He currently denies signs of infection. 2/28; patient presents for 30-day eval. He has completed 20 out of 40 HBO treatments for radiation cystitis. He reports doing well with HBO and has noticed an improvement in his symptoms. He reports that he has not seen any hematuria. He denies dysuria or increased frequency in urination. Yesterday after HBO therapy he noticed muffling in the left ear. I evaluated him at that time and noticed that there was fluid behind the ear. He had tubes placed about 2 weeks ago. I recommended he see his ENT He followed up that afternoon with them. At that time the tube was repositioned and he was started on Ciprodex . drops. He was cleared to continue HBO therapy. He reports that there is no more muffling. He denies ear  pain. Patient History Information obtained from Patient. Family History Unknown History. Social History Never smoker, Marital Status - Divorced, Alcohol Use - Rarely, Drug Use - No History, Caffeine Use - Daily - Coffee. Medical History Cardiovascular Patient has history of Hypertension Oncologic Patient has history of Received Radiation - seed implants Medical A Surgical History Notes nd Endocrine Pre-diabetic Objective Constitutional respirations regular, non-labored and within target range for patient.. Vitals Time Taken: 7:52 AM, Height: 72 in, Weight: 260 lbs, BMI: 35.3, Temperature: 98.5 F, Pulse: 73 bpm, Respiratory Rate: 22 breaths/min, Blood Pressure: 132/79 mmHg. Respiratory normal breathing without  difficulty. Cardiovascular regular rate and rhythm with normal S1, S2. Psychiatric pleasant and cooperative. General Notes: Ear: Right ear with tympanic membrane visualized with no fluid present. Left ear: Tube noticed position correctly. No more fluid present Assessment Active Problems ICD-10 Malignant neoplasm of prostate Irradiation cystitis with hematuria Type 2 diabetes mellitus with other circulatory complications Essential (primary) hypertension Patient has done well with HBO therapy. He has completed 20 out of 40 treatments. He has noticed improvement in hematuria and has not noticed any over the past couple weeks. I recommended continuing HBO therapy. He had issues with his left ear during therapy. He had tubes placed a couple of weeks ago by ENT He had this repositioned yesterday as he was having muffling. His symptoms have resolved. Nothing further to Andrew [name]. Exam was reassuring today. . Plan Follow-up Appointments: Other: - Continue hyberbaric oxygen therapy as prescribed. 1. Continue HBO Electronic Signature(s) Signed: 02/18/2022 9:30:01 AM By: [name] Andrew [name] Signed: 02/18/2022 9:30:01 AM By: [name] Andrew [name] Entered By: [name] on  02/18/2022 09:27:57 -------------------------------------------------------------------------------- HxROS Details Patient Name: Date of Service: Andrew Estimable, Andrew [name] NA LD E. 02/18/2022 7:30 A M Medical Record Number: [id number] Patient Account Number: [id number] Date of Birth/Sex: Treating RN: 08-Jun-1956 (66 y.o. M) Primary Care Provider: [name] Other Clinician: Referring Provider: Treating Provider/Extender: [name] in Treatment: 5 Information Obtained From Patient Cardiovascular Medical History: Positive for: Hypertension Endocrine Medical History: Past Medical History Notes: Pre-diabetic Oncologic Medical History: Positive for: Received Radiation - seed implants Immunizations Pneumococcal Vaccine: Received Pneumococcal Vaccination: No Implantable Devices None Family and Social History Unknown History: Yes; Never smoker; Marital Status - Divorced; Alcohol Use: Rarely; Drug Use: No History; Caffeine Use: Daily - Coffee; Financial Concerns: No; Food, Clothing or Shelter Needs: No; Support System Lacking: No; Transportation Concerns: No Electronic Signature(s) Signed: 02/18/2022 9:30:01 AM By: [name] Andrew [name] Entered By: [name] on 02/18/2022 09:21:03 -------------------------------------------------------------------------------- SuperBill Details Patient Name: Date of Service: Andrew Estimable, Andrew [name] NA LD E. 02/18/2022 Medical Record Number: [id number] Patient Account Number: [id number] Date of Birth/Sex: Treating RN: 02-18-1956 (66 y.o. [name] Primary Care Provider: [name] Other Clinician: Referring Provider: Treating Provider/Extender: [name] in Treatment: 5 Diagnosis Coding ICD-10 Codes Code Description C61 Malignant neoplasm of prostate 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 Q28 Essential  (primary) hypertension Facility Procedures CPT4 Code: [id number] Description: [phone] - WOUND CARE VISIT-LEV 2 EST PT Modifier: Quantity: 1 Physician Procedures : CPT4 Code Description Modifier [id number] - WC PHYS LEVEL 3 - EST PT ICD-10 Diagnosis Description C61 Malignant neoplasm of prostate 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 Y60  Essential (primary) hypertension Quantity: 1 Electronic Signature(s) Signed: 02/18/2022 9:30:01 AM By: [name] Andrew [name] Entered By: [name] on 02/18/2022 09:29:36

## 2022-02-19 ENCOUNTER — Encounter (HOSPITAL_BASED_OUTPATIENT_CLINIC_OR_DEPARTMENT_OTHER): Payer: 59 | Attending: Physician Assistant | Admitting: General Surgery

## 2022-02-19 ENCOUNTER — Other Ambulatory Visit: Payer: Self-pay

## 2022-02-19 DIAGNOSIS — C61 Malignant neoplasm of prostate: Secondary | ICD-10-CM | POA: Diagnosis present

## 2022-02-19 DIAGNOSIS — I10 Essential (primary) hypertension: Secondary | ICD-10-CM | POA: Diagnosis not present

## 2022-02-19 DIAGNOSIS — E1159 Type 2 diabetes mellitus with other circulatory complications: Secondary | ICD-10-CM | POA: Insufficient documentation

## 2022-02-19 DIAGNOSIS — N3041 Irradiation cystitis with hematuria: Secondary | ICD-10-CM | POA: Insufficient documentation

## 2022-02-19 LAB — GLUCOSE, CAPILLARY
Glucose-Capillary: 154 mg/dL — ABNORMAL HIGH (ref 70–99)
Glucose-Capillary: 148 mg/dL — ABNORMAL HIGH (ref 70–99)

## 2022-02-19 NOTE — Progress Notes (Addendum)
BODIN, GORKA ([id number]) ?Visit Report for 02/19/2022 ?Arrival Information Details ?Patient Name: Date of Service: ?[name], [name] 02/19/2022 8:00 A M ?Medical Record Number: [id number] ?Patient Account Number: [id number] ?Date of Birth/Sex: Treating RN: ?03-24-1956 (66 y.o. [name] ?Primary Care Jordain Radin: [name] ?Other Clinician: [name] ?Referring Jeyson Deshotel: ?Treating Symon Norwood/Extender: [name] ?[name] ?Weeks in Treatment: 5 ?Visit Information History Since Last Visit ?All ordered tests and consults were completed: Yes ?Patient Arrived: Ambulatory ?Added or deleted any medications: No ?Arrival Time: 07:48 ?Any new allergies or adverse reactions: No ?Accompanied By: none ?Had a fall or experienced change in No ?Transfer Assistance: None ?activities of daily living that may affect ?Patient Identification Verified: Yes ?risk of falls: ?Secondary Verification Process Completed: Yes ?Signs or symptoms of abuse/neglect since last visito No ?Patient Requires Transmission-Based Precautions: No ?Hospitalized since last visit: No ?Patient Has Alerts: No ?Implantable device outside of the clinic excluding No ?cellular tissue based products placed in the center ?since last visit: ?Pain Present Now: No ?Notes ?Paper version used prior to treatment. ?Electronic Signature(s) ?Signed: 02/19/2022 11:40:54 AM By: [name] EMT ?Entered By: [name] on 02/19/2022 11:40:54 ?-------------------------------------------------------------------------------- ?Encounter Discharge Information Details ?Patient Name: Date of Service: ?[name], [name] 02/19/2022 8:00 A M ?Medical Record Number: [id number] ?Patient Account Number: [id number] ?Date of Birth/Sex: Treating RN: ?1956/09/05 (66 y.o. [name] ?Primary Care Nagee Goates: [name] ?Other Clinician: [name] ?Referring Albertus Chiarelli: ?Treating Abdulahi Schor/Extender: [name] ?[name] ?Weeks in  Treatment: 5 ?Encounter Discharge Information Items ?Discharge Condition: Stable ?Ambulatory Status: Ambulatory ?Discharge Destination: Home ?Transportation: Private Auto ?Accompanied By: None ?Schedule Follow-up Appointment: Yes ?Clinical Summary of Care: ?Electronic Signature(s) ?Signed: 02/19/2022 11:46:20 AM By: [name] EMT ?Entered By: [name] on 02/19/2022 11:46:19 ?-------------------------------------------------------------------------------- ?Patient/Caregiver Education Details ?Patient Name: ?Date of Service: ?[name], [name] 3/1/2023andnbsp8:00 A M ?Medical Record Number: [id number] ?Patient Account Number: [id number] ?Date of Birth/Gender: ?Treating RN: ?05-12-1956 (66 y.o. [name] ?Primary Care Physician: [name] ?Other Clinician: [name] ?Referring Physician: ?Treating Physician/Extender: [name] ?[name] ?Weeks in Treatment: 5 ?Education Assessment ?Education Provided To: ?Patient ?Education Topics Provided ?Electronic Signature(s) ?Signed: 02/19/2022 2:54:06 PM By: [name] EMT ?Entered By: [name] on 02/19/2022 11:46:02 ?-------------------------------------------------------------------------------- ?Vitals Details ?Patient Name: ?Date of Service: ?[name], [name] 02/19/2022 8:00 A M ?Medical Record Number: [id number] ?Patient Account Number: [id number] ?Date of Birth/Sex: ?Treating RN: ?1956/09/09 (66 y.o. [name] ?Primary Care Koltan Portocarrero: [name] ?Other Clinician: [name] ?Referring Jaydalee Bardwell: ?Treating Maryl Blalock/Extender: [name] ?[name] ?Weeks in Treatment: 5 ?Vital Signs ?Time Taken: 07:59 ?Temperature (??F): 9.0 ?Height (in): 72 ?Pulse (bpm): 62 ?Weight (lbs): 260 ?Respiratory Rate (breaths/min): 18 ?Body Mass Index (BMI): 35.3 ?Blood Pressure (mmHg): 126/74 ?Capillary Blood Glucose (mg/dl): 154 ?Reference Range: 80 - 120 mg / dl ?Notes ?Paper version used prior to  treatment. ?Electronic Signature(s) ?Signed: 02/19/2022 11:41:43 AM By: [name] EMT ?Entered By: [name] on 02/19/2022 11:41:42 ?

## 2022-02-19 NOTE — Progress Notes (Signed)
TRACI, PLEMONS ([id number]) ?Visit Report for 02/19/2022 ?Problem List Details ?Patient Name: Date of Service: ?[name], [name] 02/19/2022 8:00 A M ?Medical Record Number: [id number] ?Patient Account Number: [id number] ?Date of Birth/Sex: Treating RN: ?June 23, 1956 (66 y.o. [name] ?Primary Care Provider: [name] ?Other Clinician: [name] ?Referring Provider: ?Treating Provider/Extender: [name] ?[name] ?Weeks in Treatment: 5 ?Active Problems ?ICD-10 ?Encounter ?Code Description Active Date MDM ?Diagnosis ?C61 Malignant neoplasm of prostate 01/13/2022 No Yes ?N30.41 Irradiation cystitis with hematuria 01/13/2022 No Yes ?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 01/23/3342 No Yes ?I10 Essential (primary) hypertension 01/13/2022 No Yes ?Inactive Problems ?Resolved Problems ?Electronic Signature(s) ?Signed: 02/19/2022 11:56:00 AM By: [name] ?Previous Signature: 02/19/2022 11:45:43 AM Version By: [name] EMT ?Entered By: [name] on 02/19/2022 11:56:00 ?-------------------------------------------------------------------------------- ?SuperBill Details ?Patient Name: Date of Service: ?[name], [name] 02/19/2022 ?Medical Record Number: [id number] ?Patient Account Number: [id number] ?Date of Birth/Sex: Treating RN: ?07/29/1956 (66 y.o. 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 ?I10 Essential (primary) hypertension ?Facility Procedures ?CPT4 Code: 29021115 ?Description: G0277-(Facility Use Only) HBOT full body chamber, 17min , ICD-10 Diagnosis Description C61 Malignant  neoplasm of prostate 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 Z20  ?Essential (primary) hypertension ?Modifier: ?Quantity: 5 ?Physician Procedures ?: CPT4 Code Description Modifier [id number] 12244 - WC PHYS HYPERBARIC OXYGEN THERAPY ICD-10 Diagnosis Description C61 Malignant neoplasm of prostate N30.41 Irradiation cystitis with hematuria E11.59 Type 2 diabetes mellitus with other circulatory  ?complications L75 Essential (primary) hypertension ?Quantity: 1 ?Electronic Signature(s) ?Signed: 02/19/2022 11:45:37 AM By: [name] EMT ?Signed: 02/19/2022 11:59:18 AM By: [name] ?Entered By: [name] on 02/19/2022 11:45:37 ?

## 2022-02-19 NOTE — Progress Notes (Signed)
Andrew, MANVILLE ([id number]) ?Visit Report for 02/19/2022 ?HBO Details ?Patient Name: Date of Service: ?[name], [name] 02/19/2022 8:00 A M ?Medical Record Number: [id number] ?Patient Account Number: [id number] ?Date of Birth/Sex: Treating RN: ?August 15, 1956 (66 y.o. Andrew [name] ?Primary Care Keaden Gunnoe: [name] ?Other Clinician: [name] ?Referring Ronn Smolinsky: ?Treating Drakkar Medeiros/Extender: [name] ?[name] ?Weeks in Treatment: 5 ?HBO Treatment Course Details ?Treatment Course Number: 1 ?Ordering Shatonia Hoots: [name] ?T Treatments Ordered: ?otal 40 HBO Treatment Start Date: 01/20/2022 ?HBO Indication: ?Late Effect of Radiation ?HBO Treatment Details ?Treatment Number: 22 ?Patient Type: Outpatient ?Chamber Type: Monoplace ?Chamber Serial #: [id number] ?Treatment Protocol: 2.5 ATA with 90 minutes oxygen, with two 5 minute air breaks ?Treatment Details ?Compression Rate Down: 1.0 psi / minute ?De-Compression Rate Up: 1.0 psi / minute ?A breaks and breathing ?ir ?Compress Tx Pressure periods Decompress Decompress ?Begins Reached (leave unused spaces Begins Ends ?blank) ?Chamber Pressure (ATA 1 2.5 2.5 2.5 2.5 2.5 - - 2.5 1 ?) ?Clock Time (24 hr) 08:06 08:30 09:00 09:05 09:36 09:41 - - 10:11 10:36 ?Treatment Length: 150 (minutes) ?Treatment Segments: 5 ?Vital Signs ?Capillary Blood Glucose Reference Range: 80 - 120 mg / dl ?HBO Diabetic Blood Glucose Intervention Range: <131 mg/dl or >249 mg/dl ?Time Vitals Blood Respiratory Capillary Blood Glucose Pulse Action ?Type: ?Pulse: Temperature: ?Taken: ?Pressure: ?Rate: ?Glucose (mg/dl): ?Meter #: Oximetry (%) Taken: ?Pre 07:59 126/74 62 18 9 154 ?Post 10:40 117/79 57 16 97.9 148 ?Treatment Response ?Treatment Toleration: Well ?Treatment Completion Status: Treatment Completed without Adverse Event ?Treatment Notes ?Paper version used prior to treatment. The patient did well today. I asked if he had any problems with his today after treatment.  He stated no. ?Physician HBO Attestation: ?I certify that I supervised this HBO treatment in accordance with Medicare ?guidelines. A trained emergency response team is readily available per Yes ?hospital policies and procedures. ?Continue HBOT as ordered. Yes ?Electronic Signature(s) ?Signed: 02/19/2022 11:58:38 AM By: [name] ?Previous Signature: 02/19/2022 11:45:10 AM Version By: [name] EMT ?Entered By: [name] on 02/19/2022 11:58:38 ?-------------------------------------------------------------------------------- ?HBO Safety Checklist Details ?Patient Name: ?Date of Service: ?[name], [name] 02/19/2022 8:00 A M ?Medical Record Number: [id number] ?Patient Account Number: [id number] ?Date of Birth/Sex: ?Treating RN: ?[birth year] (66 y.o. Andrew [name] ?Primary Care Kamiya Acord: [name] ?Other Clinician: [name] ?Referring Cameron Katayama: ?Treating Chastidy Ranker/Extender: [name] ?[name] ?Weeks in Treatment: 5 ?HBO Safety Checklist Items ?Safety Checklist ?Consent Form Signed ?Patient voided / foley secured and emptied ?When did you last eato 0630 ?Last dose of injectable or oral agent After treatment ?Ostomy pouch emptied and vented if applicable ?NA ?All implantable devices assessed, documented and approved ?NA ?Intravenous access site secured and place ?NA ?Valuables secured ?Linens and cotton and cotton/polyester blend (less than 51% polyester) ?Personal oil-based products / skin lotions / body lotions removed ?Wigs or hairpieces removed ?NA ?Smoking or tobacco materials removed ?Books / newspapers / magazines / loose paper removed ?Cologne, aftershave, perfume and deodorant removed ?Jewelry removed (may wrap wedding band) ?NA ?Make-up removed ?NA ?Hair care products removed ?Battery operated devices (external) removed ?Heating patches and chemical warmers removed ?Titanium eyewear removed ?NA ?Nail polish cured greater than 10 hours ?NA ?Casting material  cured greater than 10 hours ?NA ?Hearing aids removed ?NA ?Loose dentures or partials removed ?NA ?Prosthetics have been removed ?NA ?Patient demonstrates correct use of air break device (if applicable) ?Patient concerns have been addressed ?Patient grounding bracelet on and  cord attached to chamber ?Specifics for Inpatients (complete in addition to above) ?Medication sheet sent with patient ?NA ?Intravenous medications needed or due during therapy sent with patient ?NA ?Drainage tubes (e.g. nasogastric tube or chest tube secured and vented) ?NA ?Endotracheal or Tracheotomy tube secured ?NA ?Cuff deflated of air and inflated with saline ?NA ?Airway suctioned ?NA ?Notes ?Paper version used prior to treatment. ?Electronic Signature(s) ?Signed: 02/19/2022 11:42:51 AM By: [name] EMT ?Entered By: [name] on 02/19/2022 11:42:51 ?

## 2022-02-20 ENCOUNTER — Encounter (HOSPITAL_BASED_OUTPATIENT_CLINIC_OR_DEPARTMENT_OTHER): Payer: 59 | Admitting: General Surgery

## 2022-02-20 DIAGNOSIS — C61 Malignant neoplasm of prostate: Secondary | ICD-10-CM | POA: Diagnosis not present

## 2022-02-20 LAB — GLUCOSE, CAPILLARY
Glucose-Capillary: 190 mg/dL — ABNORMAL HIGH (ref 70–99)
Glucose-Capillary: 159 mg/dL — ABNORMAL HIGH (ref 70–99)

## 2022-02-20 NOTE — Progress Notes (Addendum)
Andrew, [name] ([id number]) ?Visit Report for 02/20/2022 ?Arrival Information Details ?Patient Name: Date of Service: ?Andrew Estimable, [name] 02/20/2022 8:00 A M ?Medical Record Number: [id number] ?Patient Account Number: [id number] ?Date of Birth/Sex: Treating RN: ?[birth year] (66 y.o. Andrew [name] ?Primary Care Cecia Egge: [name] ?Other Clinician: [name] ?Referring Hasten Sweitzer: ?Andrew Haig Gerardo/Extender: [name] ?[name] ?Weeks in Treatment: 5 ?Visit Information History Since Last Visit ?All ordered tests and consults were completed: Yes ?Patient Arrived: Ambulatory ?Added or deleted any medications: No ?Arrival Time: 07:46 ?Any new allergies or adverse reactions: No ?Accompanied By: None ?Had a fall or experienced change in No ?Transfer Assistance: None ?activities of daily living that may affect ?Patient Identification Verified: Yes ?risk of falls: ?Secondary Verification Process Completed: Yes ?Signs or symptoms of abuse/neglect since last visito No ?Patient Requires Transmission-Based Precautions: No ?Hospitalized since last visit: No ?Patient Has Alerts: No ?Implantable device outside of the clinic excluding No ?cellular tissue based products placed in the center ?since last visit: ?Pain Present Now: No ?Notes ?Paper version used prior to treatment. ?Electronic Signature(s) ?Signed: 02/20/2022 8:26:58 AM By: [name] EMT ?Entered By: [name] on 02/20/2022 08:26:57 ?-------------------------------------------------------------------------------- ?Encounter Discharge Information Details ?Patient Name: Date of Service: ?Andrew Estimable, [name] 02/20/2022 8:00 A M ?Medical Record Number: [id number] ?Patient Account Number: [id number] ?Date of Birth/Sex: Treating RN: ?[birth year] (66 y.o. Andrew [name] ?Primary Care [name]: [name] ?Other Clinician: [name] ?Referring Landri Dorsainvil: ?Andrew [name]/Extender: [name] ?[name] ?Weeks in  Treatment: 5 ?Encounter Discharge Information Items ?Discharge Condition: Stable ?Ambulatory Status: Ambulatory ?Discharge Destination: Home ?Transportation: Private Auto ?Accompanied By: None ?Schedule Follow-up Appointment: Yes ?Clinical Summary of Care: ?Electronic Signature(s) ?Signed: 02/20/2022 10:54:39 AM By: [name] EMT ?Entered By: [name] on 02/20/2022 10:54:39 ?-------------------------------------------------------------------------------- ?Patient/Caregiver Education Details ?Patient Name: ?Date of Service: ?[name], [name] 3/2/2023andnbsp8:00 A M ?Medical Record Number: [id number] ?Patient Account Number: [id number] ?Date of Birth/Gender: ?Treating RN: ?[birth year] (66 y.o. Andrew [name] ?Primary Care Physician: [name] ?Other Clinician: [name] ?Referring Physician: ?Andrew Physician/Extender: [name] ?[name] ?Weeks in Treatment: 5 ?Education Assessment ?Education Provided To: ?Patient ?Education Topics Provided ?Electronic Signature(s) ?Signed: 02/20/2022 12:34:28 PM By: [name] EMT ?Entered By: [name] on 02/20/2022 10:54:20 ?-------------------------------------------------------------------------------- ?Vitals Details ?Patient Name: ?Date of Service: ?Andrew Estimable, [name] 02/20/2022 8:00 A M ?Medical Record Number: [id number] ?Patient Account Number: [id number] ?Date of Birth/Sex: ?Treating RN: ?[birth year] (66 y.o. Andrew [name] ?Primary Care Kenndra Morris: [name] ?Other Clinician: [name] ?Referring [name]: ?Andrew [name]/Extender: [name] ?[name] ?Weeks in Treatment: 5 ?Vital Signs ?Time Taken: 08:00 ?Temperature (??F): 98.2 ?Height (in): 72 ?Pulse (bpm): 66 ?Weight (lbs): 260 ?Respiratory Rate (breaths/min): 18 ?Body Mass Index (BMI): 35.3 ?Blood Pressure (mmHg): 111/76 ?Capillary Blood Glucose (mg/dl): 190 ?Reference Range: 80 - 120 mg / dl ?Notes ?Paper version used prior to  treatment. ?Electronic Signature(s) ?Signed: 02/20/2022 8:28:02 AM By: [name] EMT ?Previous Signature: 02/20/2022 8:27:41 AM Version By: [name] EMT ?Entered By: [name] on 02/20/2022 08:28:02 ?

## 2022-02-20 NOTE — Progress Notes (Addendum)
STEPHAN, NELIS ([id number]) ?Visit Report for 02/20/2022 ?HBO Details ?Patient Name: Date of Service: ?Andrew Estimable, [name] 02/20/2022 8:00 A M ?Medical Record Number: [id number] ?Patient Account Number: [id number] ?Date of Birth/Sex: Treating RN: ?[birth year] (66 y.o. Andrew [name] ?Primary Care Daniela Siebers: [name] ?Other Clinician: [name] ?Referring Caliyah Sieh: ?Treating Alissia Lory/Extender: [name] ?[name] ?Weeks in Treatment: 5 ?HBO Treatment Course Details ?Treatment Course Number: 1 ?Ordering Abrie Egloff: [name] ?T Treatments Ordered: ?otal 40 HBO Treatment Start Date: 01/20/2022 ?HBO Indication: ?Late Effect of Radiation ?HBO Treatment Details ?Treatment Number: 25 ?Patient Type: Outpatient ?Chamber Type: Monoplace ?Chamber Serial #: [id number] ?Treatment Protocol: 2.5 ATA with 90 minutes oxygen, with two 5 minute air breaks ?Treatment Details ?Compression Rate Down: 1.0 psi / minute ?De-Compression Rate Up: 1.0 psi / minute ?A breaks and breathing ?ir ?Compress Tx Pressure periods Decompress Decompress ?Begins Reached (leave unused spaces Begins Ends ?blank) ?Chamber Pressure (ATA 1 2.5 2.5 2.5 2.5 2.5 - - 2.5 1 ?) ?Clock Time (24 hr) 08:06 08:25 08:55 09:00 09:30 09:35 - - 10:05 10:32 ?Treatment Length: 146 (minutes) ?Treatment Segments: 5 ?Vital Signs ?Capillary Blood Glucose Reference Range: 80 - 120 mg / dl ?HBO Diabetic Blood Glucose Intervention Range: <131 mg/dl or >249 mg/dl ?Time Vitals Blood Respiratory Capillary Blood Glucose Pulse Action ?Type: ?Pulse: Temperature: ?Taken: ?Pressure: ?Rate: ?Glucose (mg/dl): ?Meter #: Oximetry (%) Taken: ?Pre 08:00 111/76 66 18 98.2 190 ?Post 10:34 105/85 65 16 98.2 159 ?Treatment Response ?Treatment Toleration: Well ?Treatment Completion Status: Treatment Completed without Adverse Event ?Treatment Notes ?Paper version used prior to treatment. The patient did well today with his treatment. ?Physician HBO Attestation: ?I certify  that I supervised this HBO treatment in accordance with Medicare ?guidelines. A trained emergency response team is readily available per Yes ?hospital policies and procedures. ?Continue HBOT as ordered. Yes ?Electronic Signature(s) ?Signed: 02/20/2022 11:11:36 AM By: [name] ?Previous Signature: 02/20/2022 10:53:14 AM Version By: [name] EMT ?Entered By: [name] on 02/20/2022 11:11:35 ?-------------------------------------------------------------------------------- ?HBO Safety Checklist Details ?Patient Name: ?Date of Service: ?Andrew Estimable, [name] 02/20/2022 8:00 A M ?Medical Record Number: [id number] ?Patient Account Number: [id number] ?Date of Birth/Sex: ?Treating RN: ?31-Mar-1956 (66 y.o. Andrew [name] ?Primary Care Li Bobo: [name] ?Other Clinician: [name] ?Referring Alvita Fana: ?Treating Marirose Deveney/Extender: [name] ?[name] ?Weeks in Treatment: 5 ?HBO Safety Checklist Items ?Safety Checklist ?Consent Form Signed ?Patient voided / foley secured and emptied ?When did you last eato 0630 ?Last dose of injectable or oral agent After treatment ?Ostomy pouch emptied and vented if applicable ?NA ?All implantable devices assessed, documented and approved ?NA ?Intravenous access site secured and place ?NA ?Valuables secured ?Linens and cotton and cotton/polyester blend (less than 51% polyester) ?Personal oil-based products / skin lotions / body lotions removed ?Wigs or hairpieces removed ?NA ?Smoking or tobacco materials removed ?Books / newspapers / magazines / loose paper removed ?Cologne, aftershave, perfume and deodorant removed ?Jewelry removed (may wrap wedding band) ?NA ?Make-up removed ?NA ?Hair care products removed ?Battery operated devices (external) removed ?Heating patches and chemical warmers removed ?Titanium eyewear removed ?NA ?Nail polish cured greater than 10 hours ?NA ?Casting material cured greater than 10 hours ?NA ?Hearing aids  removed ?NA ?Loose dentures or partials removed ?NA ?Prosthetics have been removed ?NA ?Patient demonstrates correct use of air break device (if applicable) ?Patient concerns have been addressed ?Patient grounding bracelet on and cord attached to chamber ?Specifics for Inpatients (complete in addition to above) ?  Medication sheet sent with patient ?NA ?Intravenous medications needed or due during therapy sent with patient ?NA ?Drainage tubes (e.g. nasogastric tube or chest tube secured and vented) ?NA ?Endotracheal or Tracheotomy tube secured ?NA ?Cuff deflated of air and inflated with saline ?NA ?Airway suctioned ?NA ?Notes ?Paper version used prior to treatment. ?Electronic Signature(s) ?Signed: 02/20/2022 8:29:06 AM By: [name] EMT ?Entered By: [name] on 02/20/2022 08:29:06 ?

## 2022-02-20 NOTE — Progress Notes (Signed)
Andrew [name], Andrew [name] ([id number]) ?Visit Report for 02/20/2022 ?Problem List Details ?Patient Name: Date of Service: ?[name], DO Andrew LD E. 02/20/2022 8:00 A M ?Medical Record Number: [id number] ?Patient Account Number: [id number] ?Date of Birth/Sex: Treating RN: ?[birth year] (66 y.o. 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 02/20/2022 ?Medical Record Number: [id number] ?Patient Account Number: [id number] ?Date of Birth/Sex: Treating RN: ?1956-09-11 (66 y.o. 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 ?I10 Essential (primary) hypertension ?Facility Procedures ?CPT4 Code: 32440102 ?Description: G0277-(Facility Use Only) HBOT full body chamber, 37min , ICD-10 Diagnosis Description C61 Malignant neoplasm of prostate N30.41  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 V25  ?Essential (primary) hypertension ?Modifier: ?Quantity: 5 ?Physician Procedures ?: CPT4 Code Description Modifier [id number] - WC PHYS HYPERBARIC OXYGEN THERAPY ICD-10 Diagnosis Description C61 Malignant neoplasm of prostate N30.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

## 2022-02-21 ENCOUNTER — Encounter (HOSPITAL_BASED_OUTPATIENT_CLINIC_OR_DEPARTMENT_OTHER): Payer: 59 | Admitting: Internal Medicine

## 2022-02-21 ENCOUNTER — Other Ambulatory Visit: Payer: Self-pay

## 2022-02-21 DIAGNOSIS — E1159 Type 2 diabetes mellitus with other circulatory complications: Secondary | ICD-10-CM | POA: Diagnosis not present

## 2022-02-21 DIAGNOSIS — N3041 Irradiation cystitis with hematuria: Secondary | ICD-10-CM

## 2022-02-21 DIAGNOSIS — C61 Malignant neoplasm of prostate: Secondary | ICD-10-CM

## 2022-02-21 LAB — GLUCOSE, CAPILLARY
Glucose-Capillary: 156 mg/dL — ABNORMAL HIGH (ref 70–99)
Glucose-Capillary: 155 mg/dL — ABNORMAL HIGH (ref 70–99)

## 2022-02-21 NOTE — Progress Notes (Addendum)
CHAZE, HRUSKA ([id number]) ?Visit Report for 02/21/2022 ?Arrival Information Details ?Patient Name: Date of Service: ?Andrew Estimable, [name] 02/21/2022 8:00 A M ?Medical Record Number: [id number] ?Patient Account Number: [id number] ?Date of Birth/Sex: Treating RN: ?[birth year] (66 y.o. Andrew [name] ?Primary Care Andrew [name]: Andrew [name] ?Other Clinician: [name] ?Referring Ammar Moffatt: ?Treating Andrew [name]: Andrew [name] ?Andrew [name] ?Weeks in Treatment: 5 ?Visit Information History Since Last Visit ?All ordered tests and consults were completed: Yes ?Patient Arrived: Ambulatory ?Added or deleted any medications: No ?Arrival Time: 07:48 ?Any new allergies or adverse reactions: No ?Accompanied By: None ?Had a fall or experienced change in No ?Transfer Assistance: None ?activities of daily living that may affect ?Patient Identification Verified: Yes ?risk of falls: ?Secondary Verification Process Completed: Yes ?Signs or symptoms of abuse/neglect since last visito No ?Patient Requires Transmission-Based Precautions: No ?Hospitalized since last visit: No ?Patient Has Alerts: No ?Implantable device outside of the clinic excluding No ?cellular tissue based products placed in the center ?since last visit: ?Pain Present Now: No ?Electronic Signature(s) ?Signed: 02/21/2022 8:15:23 AM By: [name] EMT ?Entered By: [name] on 02/21/2022 08:15:23 ?-------------------------------------------------------------------------------- ?Encounter Discharge Information Details ?Patient Name: Date of Service: ?Andrew Estimable, [name] 02/21/2022 8:00 A M ?Medical Record Number: [id number] ?Patient Account Number: [id number] ?Date of Birth/Sex: Treating RN: ?[birth year] (66 y.o. Andrew [name] ?Primary Care Miloh Alcocer: Andrew [name] ?Other Clinician: [name] ?Referring Bedie Dominey: ?Treating Jesscia Imm/Extender: Andrew [name] ?Andrew [name] ?Weeks in Treatment: 5 ?Encounter Discharge Information  Items ?Discharge Condition: Stable ?Ambulatory Status: Ambulatory ?Discharge Destination: Home ?Transportation: Private Auto ?Accompanied By: self ?Schedule Follow-up Appointment: No ?Clinical Summary of Care: ?Electronic Signature(s) ?Signed: 02/21/2022 1:01:28 PM By: [name] CHT EMT BS ?, , ?Entered By: [name] on 02/21/2022 11:11:54 ?-------------------------------------------------------------------------------- ?Vitals Details ?Patient Name: ?Date of Service: ?Andrew Estimable, [name] 02/21/2022 8:00 A M ?Medical Record Number: [id number] ?Patient Account Number: [id number] ?Date of Birth/Sex: ?Treating RN: ?[birth year] (66 y.o. Andrew [name] ?Primary Care Jaan Fischel: Andrew [name] ?Other Clinician: [name] ?Referring Phillippe Orlick: ?Treating Jalena Vanderlinden/Extender: Andrew [name] ?Andrew [name] ?Weeks in Treatment: 5 ?Vital Signs ?Time Taken: 08:04 ?Temperature (??F): 98.1 ?Height (in): 72 ?Pulse (bpm): 76 ?Weight (lbs): 260 ?Respiratory Rate (breaths/min): 16 ?Body Mass Index (BMI): 35.3 ?Blood Pressure (mmHg): 109/82 ?Capillary Blood Glucose (mg/dl): 156 ?Reference Range: 80 - 120 mg / dl ?Electronic Signature(s) ?Signed: 02/21/2022 8:16:16 AM By: [name] EMT ?Entered By: [name] on 02/21/2022 08:16:16 ?

## 2022-02-21 NOTE — Progress Notes (Addendum)
RONALDO, Andrew [name] ([id number]) ?Visit Report for 02/21/2022 ?HBO Details ?Patient Name: Date of Service: ?[name], [name] 02/21/2022 8:00 A M ?Medical Record Number: [id number] ?Patient Account Number: [id number] ?Date of Birth/Sex: Treating RN: ?[birth year] (66 y.o. Andrew [name] ?Primary Care Heinz Eckert: [name] ?Other Clinician: [name] ?Referring Tanylah Schnoebelen: ?Treating Ivey Nembhard/Extender: [name] ?[name] ?Weeks in Treatment: 5 ?HBO Treatment Course Details ?Treatment Course Number: 1 ?Ordering Alliana Mcauliff: [name] ?T Treatments Ordered: ?otal 40 HBO Treatment Start Date: 01/20/2022 ?HBO Indication: ?Late Effect of Radiation ?HBO Treatment Details ?Treatment Number: 24 ?Patient Type: Outpatient ?Chamber Type: Monoplace ?Chamber Serial #: [id number] ?Treatment Protocol: 2.5 ATA with 90 minutes oxygen, with two 5 minute air breaks ?Treatment Details ?Compression Rate Down: 1.0 psi / minute ?De-Compression Rate Up: 1.0 psi / minute ?A breaks and breathing ?ir ?Compress Tx Pressure periods Decompress Decompress ?Begins Reached (leave unused spaces Begins Ends ?blank) ?Chamber Pressure (ATA 1 2.5 2.5 2.5 2.5 2.5 - - 2.5 1 ?) ?Clock Time (24 hr) 08:11 08:32 09:02 09:07 09:37 09:42 - - 10:12 10:34 ?Treatment Length: 143 (minutes) ?Treatment Segments: 5 ?Vital Signs ?Capillary Blood Glucose Reference Range: 80 - 120 mg / dl ?HBO Diabetic Blood Glucose Intervention Range: <131 mg/dl or >249 mg/dl ?Time Vitals Blood Respiratory Capillary Blood Glucose Pulse Action ?Type: ?Pulse: Temperature: ?Taken: ?Pressure: ?Rate: ?Glucose (mg/dl): ?Meter #: Oximetry (%) Taken: ?Pre 08:04 109/82 76 16 98.1 156 2 ?Post 10:40 123/85 61 16 98.2 155 2 ?Treatment Response ?Treatment Toleration: Well ?Treatment Completion Status: Treatment Completed without Adverse Event ?Physician HBO Attestation: ?I certify that I supervised this HBO treatment in accordance with Medicare ?guidelines. A trained emergency  response team is readily available per Yes ?hospital policies and procedures. ?Continue HBOT as ordered. Yes ?Electronic Signature(s) ?Signed: 02/21/2022 1:09:28 PM By: [name] DO ?Previous Signature: 02/21/2022 1:01:28 PM Version By: [name] CHT EMT BS ?, , ?Entered By: [name] on 02/21/2022 13:09:27 ?-------------------------------------------------------------------------------- ?HBO Safety Checklist Details ?Patient Name: ?Date of Service: ?[name], [name] 02/21/2022 8:00 A M ?Medical Record Number: [id number] ?Patient Account Number: [id number] ?Date of Birth/Sex: ?Treating RN: ?1956/02/27 (66 y.o. Andrew [name] ?Primary Care Shyniece Scripter: [name] ?Other Clinician: [name] ?Referring Amalea Ottey: ?Treating Shaquil Aldana/Extender: [name] ?[name] ?Weeks in Treatment: 5 ?HBO Safety Checklist Items ?Safety Checklist ?Consent Form Signed ?Patient voided / foley secured and emptied ?When did you last eato 0630 ?Last dose of injectable or oral agent After treatment ?Ostomy pouch emptied and vented if applicable ?NA ?All implantable devices assessed, documented and approved ?NA ?Intravenous access site secured and place ?NA ?Valuables secured ?Linens and cotton and cotton/polyester blend (less than 51% polyester) ?Personal oil-based products / skin lotions / body lotions removed ?Wigs or hairpieces removed ?NA ?Smoking or tobacco materials removed ?Books / newspapers / magazines / loose paper removed ?Cologne, aftershave, perfume and deodorant removed ?Jewelry removed (may wrap wedding band) ?NA ?Make-up removed ?NA ?Hair care products removed ?NA ?Battery operated devices (external) removed ?Heating patches and chemical warmers removed ?Titanium eyewear removed ?NA ?Nail polish cured greater than 10 hours ?NA ?Casting material cured greater than 10 hours ?NA ?Hearing aids removed ?NA ?Loose dentures or partials removed ?NA ?Prosthetics have been  removed ?NA ?Patient demonstrates correct use of air break device (if applicable) ?Patient concerns have been addressed ?Patient grounding bracelet on and cord attached to chamber ?Specifics for Inpatients (complete in addition to above) ?Medication sheet sent with patient ?NA ?Intravenous medications needed or  due during therapy sent with patient ?NA ?Drainage tubes (e.g. nasogastric tube or chest tube secured and vented) ?NA ?Endotracheal or Tracheotomy tube secured ?NA ?Cuff deflated of air and inflated with saline ?NA ?Airway suctioned ?NA ?Electronic Signature(s) ?Signed: 02/21/2022 8:17:47 AM By: [name] EMT ?Entered By: [name] on 02/21/2022 08:17:47 ?

## 2022-02-24 ENCOUNTER — Other Ambulatory Visit: Payer: Self-pay

## 2022-02-24 ENCOUNTER — Encounter (HOSPITAL_BASED_OUTPATIENT_CLINIC_OR_DEPARTMENT_OTHER): Payer: 59 | Admitting: Internal Medicine

## 2022-02-24 DIAGNOSIS — E1159 Type 2 diabetes mellitus with other circulatory complications: Secondary | ICD-10-CM | POA: Diagnosis not present

## 2022-02-24 DIAGNOSIS — N3041 Irradiation cystitis with hematuria: Secondary | ICD-10-CM | POA: Diagnosis not present

## 2022-02-24 DIAGNOSIS — I10 Essential (primary) hypertension: Secondary | ICD-10-CM | POA: Diagnosis not present

## 2022-02-24 DIAGNOSIS — C61 Malignant neoplasm of prostate: Secondary | ICD-10-CM | POA: Diagnosis not present

## 2022-02-24 LAB — GLUCOSE, CAPILLARY
Glucose-Capillary: 161 mg/dL — ABNORMAL HIGH (ref 70–99)
Glucose-Capillary: 158 mg/dL — ABNORMAL HIGH (ref 70–99)

## 2022-02-24 NOTE — Progress Notes (Signed)
RISHAV, ROCKEFELLER ([id number]) ?Visit Report for 02/24/2022 ?Problem List Details ?Patient Name: Date of Service: ?[name], [name] 02/24/2022 8:00 A M ?Medical Record Number: [id number] ?Patient Account Number: [id number] ?Date of Birth/Sex: Treating RN: ?Aug 05, 1956 (66 y.o. 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 02/24/2022 ?Medical Record Number: [id number] ?Patient Account Number: [id number] ?Date of Birth/Sex: Treating RN: ?[birth year] (66 y.o. 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 ?I10 Essential (primary) hypertension ?Facility Procedures ?CPT4 Code: [id number] ?Description: G0277-(Facility Use Only) HBOT full body chamber, 83mn , ICD-10 Diagnosis Description N30.41 Irradiation cystitis with hematuria C61  Malignant neoplasm of prostate 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

## 2022-02-24 NOTE — Progress Notes (Addendum)
Andrew [name] ([id number]) ?Visit Report for 02/24/2022 ?Arrival Information Details ?Patient Name: Date of Service: ?Andrew Estimable, [name] 02/24/2022 8:00 A M ?Medical Record Number: [id number] ?Patient Account Number: [id number] ?Date of Birth/Sex: Treating RN: ?1956-04-18 (66 y.o. [name] ?Primary Care [name]: [name] ?Other Clinician: [name] ?Referring [name]: ?Treating [name]/Extender: [name] ?[name] ?Weeks in Treatment: 6 ?Visit Information History Since Last Visit ?All ordered tests and consults were completed: Yes ?Patient Arrived: Ambulatory ?Added or deleted any medications: No ?Arrival Time: 07:47 ?Any new allergies or adverse reactions: No ?Accompanied By: None ?Had a fall or experienced change in No ?Transfer Assistance: None ?activities of daily living that may affect ?Patient Identification Verified: Yes ?risk of falls: ?Secondary Verification Process Completed: Yes ?Signs or symptoms of abuse/neglect since last visito No ?Patient Requires Transmission-Based Precautions: No ?Hospitalized since last visit: No ?Patient Has Alerts: No ?Implantable device outside of the clinic excluding No ?cellular tissue based products placed in the center ?since last visit: ?Pain Present Now: No ?Electronic Signature(s) ?Signed: 02/24/2022 8:18:47 AM By: [name] EMT ?Entered By: [name] on 02/24/2022 08:18:47 ?-------------------------------------------------------------------------------- ?Encounter Discharge Information Details ?Patient Name: Date of Service: ?Andrew Estimable, [name] 02/24/2022 8:00 A M ?Medical Record Number: [id number] ?Patient Account Number: [id number] ?Date of Birth/Sex: Treating RN: ?1956-04-01 (66 y.o. [name] ?Primary Care [name]: [name] ?Other Clinician: [name] ?Referring [name]: ?Treating Arshdeep Bolger/Extender: [name] ?[name] ?Weeks in Treatment: 6 ?Encounter Discharge Information  Items ?Discharge Condition: Stable ?Ambulatory Status: Ambulatory ?Discharge Destination: Home ?Transportation: Private Auto ?Accompanied By: None ?Schedule Follow-up Appointment: Yes ?Clinical Summary of Care: ?Electronic Signature(s) ?Signed: 02/24/2022 10:59:41 AM By: [name] EMT ?Entered By: [name] on 02/24/2022 10:59:41 ?-------------------------------------------------------------------------------- ?Patient/Caregiver Education Details ?Patient Name: ?Date of Service: ?Andrew YCE, [name] 3/6/2023andnbsp8:00 A M ?Medical Record Number: [id number] ?Patient Account Number: [id number] ?Date of Birth/Gender: ?Treating RN: ?20-May-1956 (66 y.o. [name] ?Primary Care Physician: [name] ?Other Clinician: [name] ?Referring Physician: ?Treating Physician/Extender: [name] ?[name] ?Weeks in Treatment: 6 ?Education Assessment ?Education Provided To: ?Patient ?Education Topics Provided ?Electronic Signature(s) ?Signed: 02/24/2022 10:59:57 AM By: [name] EMT ?Entered By: [name] on 02/24/2022 10:59:23 ?-------------------------------------------------------------------------------- ?Vitals Details ?Patient Name: ?Date of Service: ?Andrew Estimable, [name] 02/24/2022 8:00 A M ?Medical Record Number: [id number] ?Patient Account Number: [id number] ?Date of Birth/Sex: ?Treating RN: ?[birth year] (66 y.o. [name] ?Primary Care Carlos Quackenbush: [name] ?Other Clinician: [name] ?Referring Lynna Zamorano: ?Treating Autumn Gunn/Extender: [name] ?[name] ?Weeks in Treatment: 6 ?Vital Signs ?Time Taken: 07:58 ?Temperature (??F): 98.2 ?Height (in): 72 ?Pulse (bpm): 79 ?Weight (lbs): 260 ?Respiratory Rate (breaths/min): 16 ?Body Mass Index (BMI): 35.3 ?Blood Pressure (mmHg): 143/82 ?Capillary Blood Glucose (mg/dl): 161 ?Reference Range: 80 - 120 mg / dl ?Electronic Signature(s) ?Signed: 02/24/2022 8:19:33 AM By: [name] EMT ?Entered By:  [name] on 02/24/2022 08:19:33 ?

## 2022-02-24 NOTE — Progress Notes (Signed)
KINTA, MARTIS ([id number]) ?Visit Report for 02/21/2022 ?SuperBill Details ?Patient Name: Date of Service: ?[name], [name] 02/21/2022 ?Medical Record Number: [id number] ?Patient Account Number: [id number] ?Date of Birth/Sex: Treating RN: ?[birth year] (66 y.o. 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 ?I10 Essential (primary) hypertension ?Facility Procedures ?CPT4 Code Description Modifier Quantity ?15056979 G0277-(Facility Use Only) HBOT full body chamber, 27mn ?, 5 ?ICD-10 Diagnosis Description ?N30.41 Irradiation cystitis with hematuria ?C61 Malignant neoplasm of prostate ?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

## 2022-02-24 NOTE — Progress Notes (Addendum)
Andrew, [name] ([id number]) ?Visit Report for 02/24/2022 ?HBO Details ?Patient Name: Date of Service: ?[name], [name] 02/24/2022 8:00 A M ?Medical Record Number: [id number] ?Patient Account Number: [id number] ?Date of Birth/Sex: Andrew [name]: ?[birth year] (66 y.o. Andrew [name] ?Primary Care Montavis Schubring: [name] ?Other Clinician: [name] ?Referring Verdie Barrows: ?Andrew Ankit Degregorio/Extender: [name] ?[name] ?Weeks in Treatment: 6 ?HBO Treatment Course Details ?Treatment Course Number: 1 ?Ordering Olean Sangster: [name] ?T Treatments Ordered: ?otal 40 HBO Treatment Start Date: 01/20/2022 ?HBO Indication: ?Late Effect of Radiation ?HBO Treatment Details ?Treatment Number: 25 ?Patient Type: Outpatient ?Chamber Type: Monoplace ?Chamber Serial #: [id number] ?Treatment Protocol: 2.5 ATA with 90 minutes oxygen, with two 5 minute air breaks ?Treatment Details ?Compression Rate Down: 1.0 psi / minute ?De-Compression Rate Up: 1.0 psi / minute ?A breaks and breathing ?ir ?Compress Tx Pressure periods Decompress Decompress ?Begins Reached (leave unused spaces Begins Ends ?blank) ?Chamber Pressure (ATA 1 2.5 2.5 2.5 2.5 2.5 - - 2.5 1 ?) ?Clock Time (24 hr) 08:10 08:32 09:02 09:08 09:38 09:43 - - 10:13 10:37 ?Treatment Length: 147 (minutes) ?Treatment Segments: 5 ?Vital Signs ?Capillary Blood Glucose Reference Range: 80 - 120 mg / dl ?HBO Diabetic Blood Glucose Intervention Range: <131 mg/dl or >249 mg/dl ?Time Vitals Blood Respiratory Capillary Blood Glucose Pulse Action ?Type: ?Pulse: Temperature: ?Taken: ?Pressure: ?Rate: ?Glucose (mg/dl): ?Meter #: Oximetry (%) Taken: ?Pre 07:58 143/82 79 16 98.2 161 ?Post 10:40 123/86 57 16 97.9 158 ?Treatment Response ?Treatment Toleration: Well ?Treatment Completion Status: Treatment Completed without Adverse Event ?Treatment Notes ?The patient had no problems today, He cleared his ears without any problem. I asked the patient after treatment if his ears were  clear, and he stated that were ?OK. ?Physician HBO Attestation: ?I certify that I supervised this HBO treatment in accordance with Medicare ?guidelines. A trained emergency response team is readily available per Yes ?hospital policies and procedures. ?Continue HBOT as ordered. Yes ?Electronic Signature(s) ?Signed: 02/24/2022 4:23:17 PM By: [name] DO ?Previous Signature: 02/24/2022 10:59:03 AM Version By: [name] EMT ?Previous Signature: 02/24/2022 10:56:40 AM Version By: [name] EMT ?Previous Signature: 02/24/2022 10:56:22 AM Version By: [name] EMT ?Entered By: [name] on 02/24/2022 16:21:20 ?-------------------------------------------------------------------------------- ?HBO Safety Checklist Details ?Patient Name: ?Date of Service: ?[name], [name] 02/24/2022 8:00 A M ?Medical Record Number: [id number] ?Patient Account Number: [id number] ?Date of Birth/Sex: ?Andrew [name]: ?23-Nov-1956 (66 y.o. Andrew [name] ?Primary Care Riannah [name]: [name] ?Other Clinician: [name] ?Referring Wash Nienhaus: ?Andrew Ariannie Penaloza/Extender: [name] ?[name] ?Weeks in Treatment: 6 ?HBO Safety Checklist Items ?Safety Checklist ?Consent Form Signed ?Patient voided / foley secured and emptied ?When did you last eato 0600 ?Last dose of injectable or oral agent After treatment ?Ostomy pouch emptied and vented if applicable ?NA ?All implantable devices assessed, documented and approved ?NA ?Intravenous access site secured and place ?NA ?Valuables secured ?Linens and cotton and cotton/polyester blend (less than 51% polyester) ?Personal oil-based products / skin lotions / body lotions removed ?Wigs or hairpieces removed ?NA ?Smoking or tobacco materials removed ?Books / newspapers / magazines / loose paper removed ?Cologne, aftershave, perfume and deodorant removed ?Jewelry removed (may wrap wedding band) ?NA ?Make-up removed ?NA ?Hair care products removed ?Battery operated  devices (external) removed ?Heating patches and chemical warmers removed ?Titanium eyewear removed ?NA ?Nail polish cured greater than 10 hours ?NA ?Casting material cured greater than 10 hours ?NA ?Hearing aids removed ?NA ?Loose dentures or partials removed ?NA ?Prosthetics have  been removed ?NA ?Patient demonstrates correct use of air break device (if applicable) ?Patient concerns have been addressed ?Patient grounding bracelet on and cord attached to chamber ?Specifics for Inpatients (complete in addition to above) ?Medication sheet sent with patient ?NA ?Intravenous medications needed or due during therapy sent with patient ?NA ?Drainage tubes (e.g. nasogastric tube or chest tube secured and vented) ?NA ?Endotracheal or Tracheotomy tube secured ?NA ?Cuff deflated of air and inflated with saline ?NA ?Airway suctioned ?NA ?Electronic Signature(s) ?Signed: 02/24/2022 8:20:50 AM By: [name] EMT ?Entered By: [name] on 02/24/2022 08:20:50 ?

## 2022-02-25 ENCOUNTER — Encounter (HOSPITAL_BASED_OUTPATIENT_CLINIC_OR_DEPARTMENT_OTHER): Payer: 59 | Admitting: Internal Medicine

## 2022-02-25 DIAGNOSIS — C61 Malignant neoplasm of prostate: Secondary | ICD-10-CM

## 2022-02-25 DIAGNOSIS — N3041 Irradiation cystitis with hematuria: Secondary | ICD-10-CM | POA: Diagnosis not present

## 2022-02-25 DIAGNOSIS — E1159 Type 2 diabetes mellitus with other circulatory complications: Secondary | ICD-10-CM | POA: Diagnosis not present

## 2022-02-25 DIAGNOSIS — I10 Essential (primary) hypertension: Secondary | ICD-10-CM | POA: Diagnosis not present

## 2022-02-25 LAB — GLUCOSE, CAPILLARY
Glucose-Capillary: 184 mg/dL — ABNORMAL HIGH (ref 70–99)
Glucose-Capillary: 168 mg/dL — ABNORMAL HIGH (ref 70–99)

## 2022-02-25 NOTE — Progress Notes (Addendum)
[name], [name] ([id number]) ?Visit Report for 02/25/2022 ?Arrival Information Details ?Patient Name: Date of Service: ?Andrew Estimable, [name] 02/25/2022 8:00 A M ?Medical Record Number: [id number] ?Patient Account Number: [id number] ?Date of Birth/Sex: Treating RN: ?1956-05-04 (66 y.o. [name] ?Primary Care Andrew [name]: Andrew [name] ?Other Clinician: Valeria [name] ?Referring Vayla Wilhelmi: ?Treating Tayelor Osborne/Extender: Andrew [name] ?Andrew [name] ?Weeks in Treatment: 6 ?Visit Information History Since Last Visit ?All ordered tests and consults were completed: Yes ?Patient Arrived: Ambulatory ?Added or deleted any medications: No ?Arrival Time: 07:48 ?Any new allergies or adverse reactions: No ?Accompanied By: None ?Had a fall or experienced change in No ?Transfer Assistance: None ?activities of daily living that may affect ?Patient Identification Verified: Yes ?risk of falls: ?Secondary Verification Process Completed: Yes ?Signs or symptoms of abuse/neglect since last visito No ?Patient Requires Transmission-Based Precautions: No ?Hospitalized since last visit: No ?Patient Has Alerts: No ?Implantable device outside of the clinic excluding No ?cellular tissue based products placed in the center ?since last visit: ?Pain Present Now: No ?Electronic Signature(s) ?Signed: 02/25/2022 8:13:00 AM By: Andrew [name] EMT ?Entered By: Andrew [name] on 02/25/2022 08:13:00 ?-------------------------------------------------------------------------------- ?Encounter Discharge Information Details ?Patient Name: Date of Service: ?Andrew Estimable, [name] 02/25/2022 8:00 A M ?Medical Record Number: [id number] ?Patient Account Number: [id number] ?Date of Birth/Sex: Treating RN: ?Dec 07, 1956 (66 y.o. [name] ?Primary Care Naliya Gish: Andrew [name] ?Other Clinician: Valeria [name] ?Referring Nailyn Dearinger: ?Treating Arryn Terrones/Extender: Andrew [name] ?Andrew [name] ?Weeks in Treatment: 6 ?Encounter Discharge Information  Items ?Discharge Condition: Stable ?Ambulatory Status: Ambulatory ?Discharge Destination: Home ?Transportation: Private Auto ?Accompanied By: None ?Schedule Follow-up Appointment: Yes ?Clinical Summary of Care: ?Electronic Signature(s) ?Signed: 02/25/2022 10:54:45 AM By: Andrew [name] EMT ?Entered By: Andrew [name] on 02/25/2022 10:54:45 ?-------------------------------------------------------------------------------- ?Patient/Caregiver Education Details ?Patient Name: ?Date of Service: ?[name], [name] 3/7/2023andnbsp8:00 A M ?Medical Record Number: [id number] ?Patient Account Number: [id number] ?Date of Birth/Gender: ?Treating RN: ?29-Oct-1956 (66 y.o. [name] ?Primary Care Physician: Andrew [name] ?Other Clinician: Valeria [name] ?Referring Physician: ?Treating Physician/Extender: Andrew [name] ?Andrew [name] ?Weeks in Treatment: 6 ?Education Assessment ?Education Provided To: ?Patient ?Education Topics Provided ?Electronic Signature(s) ?Signed: 02/25/2022 2:12:17 PM By: Andrew [name] EMT ?Entered By: Andrew [name] on 02/25/2022 10:54:29 ?-------------------------------------------------------------------------------- ?Vitals Details ?Patient Name: ?Date of Service: ?Andrew Estimable, [name] 02/25/2022 8:00 A M ?Medical Record Number: [id number] ?Patient Account Number: [id number] ?Date of Birth/Sex: ?Treating RN: ?04/08/1956 (66 y.o. [name] ?Primary Care Roben Tatsch: Andrew [name] ?Other Clinician: Valeria [name] ?Referring Yomar Mejorado: ?Treating Jamol Ginyard/Extender: Andrew [name] ?Andrew [name] ?Weeks in Treatment: 6 ?Vital Signs ?Time Taken: 08:00 ?Temperature (??F): 97.9 ?Height (in): 72 ?Pulse (bpm): 64 ?Weight (lbs): 260 ?Respiratory Rate (breaths/min): 16 ?Body Mass Index (BMI): 35.3 ?Blood Pressure (mmHg): 125/75 ?Capillary Blood Glucose (mg/dl): 184 ?Reference Range: 80 - 120 mg / dl ?Electronic Signature(s) ?Signed: 02/25/2022 8:13:42 AM By: Andrew [name] EMT ?Entered By:  Andrew [name] on 02/25/2022 08:13:42 ?

## 2022-02-25 NOTE — Progress Notes (Addendum)
Andrew [name], Andrew [name] ([id number]) ?Visit Report for 02/25/2022 ?HBO Details ?Patient Name: Date of Service: ?[name], [name] 02/25/2022 8:00 A M ?Medical Record Number: [id number] ?Patient Account Number: [id number] ?Date of Birth/Sex: Treating RN: ?1956/06/27 (66 y.o. 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 ?HBO Treatment Details ?Treatment Number: 26 ?Patient Type: Outpatient ?Chamber Type: Monoplace ?Chamber Serial #: [id number] ?Treatment Protocol: 2.5 ATA with 90 minutes oxygen, with two 5 minute air breaks ?Treatment Details ?Compression Rate Down: 1.0 psi / minute De-Compression Rate Up: ?A breaks and breathing ?ir ?Compress Tx Pressure periods Decompress Decompress ?Begins Reached (leave unused spaces Begins Ends ?blank) ?Chamber Pressure (ATA 1 2.5 2.5 2.5 2.5 2.5 - - 2.5 1 ?) ?Clock Time (24 hr) 08:06 08:28 08:58 09:03 09:33 09:39 - - 10:09 10:35 ?Treatment Length: 149 (minutes) ?Treatment Segments: 5 ?Vital Signs ?Capillary Blood Glucose Reference Range: 80 - 120 mg / dl ?HBO Diabetic Blood Glucose Intervention Range: <131 mg/dl or >249 mg/dl ?Time Vitals Blood Respiratory Capillary Blood Glucose Pulse Action ?Type: ?Pulse: Temperature: ?Taken: ?Pressure: ?Rate: ?Glucose (mg/dl): ?Meter #: Oximetry (%) Taken: ?Pre 08:00 125/75 64 16 97.9 184 ?Post 10:39 115/81 64 14 98 168 ?Treatment Response ?Treatment Toleration: Well ?Treatment Completion Status: Treatment Completed without Adverse Event ?Treatment Notes ?The patient did well today. I asked him, after treatment if he had any problems with his ears. He stated no. ?Physician HBO Attestation: ?I certify  that I supervised this HBO treatment in accordance with Medicare ?guidelines. A trained emergency response team is readily available per Yes ?hospital policies and procedures. ?Continue HBOT as ordered. Yes ?Electronic Signature(s) ?Signed: 02/25/2022 4:29:59 PM By: [name] DO ?Previous Signature: 02/25/2022 10:53:24 AM Version By: [name] EMT ?Previous Signature: 02/25/2022 8:29:17 AM Version By: [name] EMT ?Entered By: [name] on 02/25/2022 16:09:36 ?-------------------------------------------------------------------------------- ?HBO Safety Checklist Details ?Patient Name: ?Date of Service: ?[name], [name] 02/25/2022 8:00 A M ?Medical Record Number: [id number] ?Patient Account Number: [id number] ?Date of Birth/Sex: ?Treating RN: ?1956/08/05 (66 y.o. [name] ?Primary Care Krystn Dermody: [name] ?Other Clinician: [name] ?Referring Char Feltman: ?Treating Loral Campi/Extender: [name] ?[name] ?Weeks in Treatment: 6 ?HBO Safety Checklist Items ?Safety Checklist ?Consent Form Signed ?Patient voided / foley secured and emptied ?When did you last eato 0600 ?Last dose of injectable or oral agent After treatment ?Ostomy pouch emptied and vented if applicable ?NA ?All implantable devices assessed, documented and approved ?NA ?Intravenous access site secured and place ?NA ?Valuables secured ?Linens and cotton and cotton/polyester blend (less than 51% polyester) ?Personal oil-based products / skin lotions / body lotions removed ?Wigs or hairpieces removed ?NA ?Smoking or tobacco materials removed ?Books / newspapers / magazines / loose paper removed ?Cologne, aftershave, perfume and deodorant removed ?Jewelry removed (may wrap wedding band) ?NA ?Make-up removed ?NA ?Hair care products removed ?Battery operated devices (external) removed ?Heating patches and chemical warmers removed ?Titanium eyewear removed ?NA ?Nail polish cured greater than 10  hours ?NA ?Casting material cured greater than 10 hours ?NA ?Hearing aids removed ?NA ?Loose dentures or partials removed ?NA ?Prosthetics have been removed ?NA ?Patient demonstrates correct use of air break device (if applicable) ?Patient concerns have been addressed ?Patient grounding bracelet on and  cord attached to chamber ?Specifics for Inpatients (complete in addition to above) ?Medication sheet sent with patient ?NA ?Intravenous medications needed or due during therapy sent with patient ?NA ?Drainage tubes (e.g. nasogastric tube or chest tube secured and vented) ?NA ?Endotracheal or Tracheotomy tube secured ?NA ?Cuff deflated of air and inflated with saline ?NA ?Airway suctioned ?NA ?Electronic Signature(s) ?Signed: 02/25/2022 8:14:45 AM By: [name] EMT ?Entered By: [name] on 02/25/2022 08:14:45 ?

## 2022-02-25 NOTE — Progress Notes (Signed)
MANASES, ETCHISON ([id number]) ?Visit Report for 02/25/2022 ?Problem List Details ?Patient Name: Date of Service: ?[name], [name] 02/25/2022 8:00 A M ?Medical Record Number: [id number] ?Patient Account Number: [id number] ?Date of Birth/Sex: Treating RN: ?01-13-1956 (66 y.o. 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 02/25/2022 ?Medical Record Number: [id number] ?Patient Account Number: [id number] ?Date of Birth/Sex: Treating RN: ?17-May-1956 (66 y.o. 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 ?I10 Essential (primary) hypertension ?Facility Procedures ?CPT4 Code: [id number] ?Description: G0277-(Facility Use Only) HBOT full body chamber, 47mn , ICD-10 Diagnosis Description C61 Malignant neoplasm of prostate N30.41  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

## 2022-02-26 ENCOUNTER — Encounter (HOSPITAL_BASED_OUTPATIENT_CLINIC_OR_DEPARTMENT_OTHER): Payer: 59 | Admitting: General Surgery

## 2022-02-26 ENCOUNTER — Other Ambulatory Visit: Payer: Self-pay

## 2022-02-26 DIAGNOSIS — C61 Malignant neoplasm of prostate: Secondary | ICD-10-CM | POA: Diagnosis not present

## 2022-02-26 LAB — GLUCOSE, CAPILLARY
Glucose-Capillary: 156 mg/dL — ABNORMAL HIGH (ref 70–99)
Glucose-Capillary: 147 mg/dL — ABNORMAL HIGH (ref 70–99)

## 2022-02-26 NOTE — Progress Notes (Addendum)
Andrew [name], Andrew [name] ([id number]) ?Visit Report for 02/26/2022 ?Arrival Information Details ?Patient Name: Date of Service: ?[name], [name] 02/26/2022 8:00 A M ?Medical Record Number: [id number] ?Patient Account Number: [id number] ?Date of Birth/Sex: Treating RN: ?[birth year] (66 y.o. [name] ?Primary Care Devin Ganaway: [name] ?Other Clinician: [name] ?Referring Taneia Mealor: ?Treating Ysenia Filice/Extender: [name] ?[name] ?Weeks in Treatment: 6 ?Visit Information History Since Last Visit ?All ordered tests and consults were completed: Yes ?Patient Arrived: Ambulatory ?Added or deleted any medications: No ?Arrival Time: 07:47 ?Any new allergies or adverse reactions: No ?Accompanied By: None ?Had a fall or experienced change in No ?Transfer Assistance: None ?activities of daily living that may affect ?Patient Identification Verified: Yes ?risk of falls: ?Secondary Verification Process Completed: Yes ?Signs or symptoms of abuse/neglect since last visito No ?Patient Requires Transmission-Based Precautions: No ?Hospitalized since last visit: No ?Patient Has Alerts: No ?Implantable device outside of the clinic excluding No ?cellular tissue based products placed in the center ?since last visit: ?Pain Present Now: No ?Notes ?Paper version used prior to treatment ?Electronic Signature(s) ?Signed: 02/26/2022 8:32:45 AM By: [name] EMT ?Entered By: [name] on 02/26/2022 08:32:45 ?-------------------------------------------------------------------------------- ?Encounter Discharge Information Details ?Patient Name: Date of Service: ?[name], [name] 02/26/2022 8:00 A M ?Medical Record Number: [id number] ?Patient Account Number: [id number] ?Date of Birth/Sex: Treating RN: ?[birth year] (66 y.o. [name] ?Primary Care Haskell Rihn: [name] ?Other Clinician: [name] ?Referring Tessi Eustache: ?Treating Kei Langhorst/Extender: [name] ?[name] ?Weeks in  Treatment: 6 ?Encounter Discharge Information Items ?Discharge Condition: Stable ?Ambulatory Status: Ambulatory ?Discharge Destination: Home ?Transportation: Private Auto ?Accompanied By: None ?Schedule Follow-up Appointment: Yes ?Clinical Summary of Care: ?Electronic Signature(s) ?Signed: 02/26/2022 10:52:04 AM By: [name] EMT ?Entered By: [name] on 02/26/2022 10:52:04 ?-------------------------------------------------------------------------------- ?Patient/Caregiver Education Details ?Patient Name: ?Date of Service: ?[name], [name] 3/8/2023andnbsp8:00 A M ?Medical Record Number: [id number] ?Patient Account Number: [id number] ?Date of Birth/Gender: ?Treating RN: ?11-07-1956 (66 y.o. [name] ?Primary Care Physician: [name] ?Other Clinician: [name] ?Referring Physician: ?Treating Physician/Extender: [name] ?[name] ?Weeks in Treatment: 6 ?Education Assessment ?Education Provided To: ?Patient ?Education Topics Provided ?Electronic Signature(s) ?Signed: 02/26/2022 5:20:23 PM By: [name] EMT ?Entered By: [name] on 02/26/2022 10:51:46 ?-------------------------------------------------------------------------------- ?Vitals Details ?Patient Name: ?Date of Service: ?[name], [name] 02/26/2022 8:00 A M ?Medical Record Number: [id number] ?Patient Account Number: [id number] ?Date of Birth/Sex: ?Treating RN: ?05/02/1956 (66 y.o. [name] ?Primary Care Labresha Mellor: [name] ?Other Clinician: [name] ?Referring Keala Drum: ?Treating Odus Clasby/Extender: [name] ?[name] ?Weeks in Treatment: 6 ?Vital Signs ?Time Taken: 07:47 ?Temperature (??F): 98.1 ?Height (in): 72 ?Pulse (bpm): 68 ?Weight (lbs): 260 ?Respiratory Rate (breaths/min): 16 ?Body Mass Index (BMI): 35.3 ?Blood Pressure (mmHg): 124/75 ?Capillary Blood Glucose (mg/dl): 156 ?Reference Range: 80 - 120 mg / dl ?Notes ?Paper version used prior to treatment.  The patient was asked if he had any problems with his ears today. He stated no. ?Electronic Signature(s) ?Signed: 02/26/2022 8:46:08 AM By: [name] EMT ?Entered By: [name] on 02/26/2022 08:46:08 ?

## 2022-02-26 NOTE — Progress Notes (Signed)
Andrew [name], Andrew [name] ([id number]) ?Visit Report for 02/26/2022 ?Problem List Details ?Patient Name: Date of Service: ?[name], [name] 02/26/2022 8:00 A M ?Medical Record Number: [id number] ?Patient Account Number: [id number] ?Date of Birth/Sex: Treating RN: ?09-30-1956 (66 y.o. 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 02/26/2022 ?Medical Record Number: [id number] ?Patient Account Number: [id number] ?Date of Birth/Sex: Treating RN: ?23-Oct-1956 (66 y.o. 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 ?I10 Essential (primary) hypertension ?Facility Procedures ?CPT4 Code: 17616073 ?Description: G0277-(Facility Use Only) HBOT full body chamber, 70mn , ICD-10 Diagnosis Description C61 Malignant  neoplasm of prostate 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 IX10 ?Essential (primary) hypertension ?Modifier: ?Quantity: 5 ?Physician Procedures ?: CPT4 Code Description Modifier [id number]- WC PHYS HYPERBARIC OXYGEN THERAPY ICD-10 Diagnosis Description C61 Malignant neoplasm of prostate N30.41 Irradiation cystitis with hematuria E11.59 Type 2 diabetes mellitus with other circulatory  ?complications IJ50Essential (primary) hypertension ?Quantity: 1 ?Electronic Signature(s) ?Signed: 02/26/2022 12:47:26 PM By: CFredirick MaudlinMD FACS ?Previous Signature: 02/26/2022 10:51:11 AM Version By: GValeria BatmanEMT ?Entered By: CFredirick Maudlinon 02/26/2022 12:47:25 ?

## 2022-02-26 NOTE — Progress Notes (Addendum)
[name], [name] ([id number]) ?Visit Report for 02/26/2022 ?HBO Details ?Patient Name: Date of Service: ?Andrew [name], Andrew [name]. 02/26/2022 8:00 A M ?Medical Record Number: [id number] ?Patient Account Number: [id number] ?Date of Birth/Sex: Treating RN: ?1956-09-21 (66 y.o. [name] ?Primary Care Caroleen Stoermer: [name] ?Other Clinician: [name] ?Referring Brytni Dray: ?Treating Gwenna Fuston/Extender: [name] ?[name] ?Weeks in Treatment: 6 ?HBO Treatment Course Details ?Treatment Course Number: 1 ?Ordering Blakeleigh Domek: [name] ?T Treatments Ordered: ?otal 40 HBO Treatment Start Date: 01/20/2022 ?HBO Indication: ?Late Effect of Radiation ?HBO Treatment Details ?Treatment Number: 27 ?Patient Type: Outpatient ?Chamber Type: Monoplace ?Chamber Serial #: [id number] ?Treatment Protocol: 2.5 ATA with 90 minutes oxygen, with two 5 minute air breaks ?Treatment Details ?Compression Rate Down: 1.0 psi / minute ?De-Compression Rate Up: 1.0 psi / minute ?A breaks and breathing ?ir ?Compress Tx Pressure periods Decompress Decompress ?Begins Reached (leave unused spaces Begins Ends ?blank) ?Chamber Pressure (ATA 1 2.5 2.5 2.5 2.5 2.5 - - 2.5 1 ?) ?Clock Time (24 hr) 08:07 08:29 08:59 09:04 09:34 09:39 - - 10:09 10:33 ?Treatment Length: 146 (minutes) ?Treatment Segments: 5 ?Vital Signs ?Capillary Blood Glucose Reference Range: 80 - 120 mg / dl ?HBO Diabetic Blood Glucose Intervention Range: <131 mg/dl or >249 mg/dl ?Time Vitals Blood Respiratory Capillary Blood Glucose Pulse Action ?Type: ?Pulse: Temperature: ?Taken: ?Pressure: ?Rate: ?Glucose (mg/dl): ?Meter #: Oximetry (%) Taken: ?Pre 07:47 124/75 68 16 98.1 156 ?Post 10:37 117/75 56 14 98.7 147 ?Treatment Response ?Treatment Toleration: Well ?Treatment Completion Status: Treatment Completed without Adverse Event ?Treatment Notes ?Paper version used prior to treatment. I asked the patient if he had any ear problems after treatment. He stated he felt  good. ?Physician HBO Attestation: ?I certify that I supervised this HBO treatment in accordance with Medicare ?guidelines. A trained emergency response team is readily available per Yes ?hospital policies and procedures. ?Continue HBOT as ordered. Yes ?Electronic Signature(s) ?Signed: 02/26/2022 12:47:14 PM By: [name] ?Previous Signature: 02/26/2022 10:50:43 AM Version By: [name] EMT ?Previous Signature: 02/26/2022 10:44:17 AM Version By: [name] EMT ?Previous Signature: 02/26/2022 8:48:16 AM Version By: [name] EMT ?Entered By: [name] on 02/26/2022 12:47:14 ?-------------------------------------------------------------------------------- ?HBO Safety Checklist Details ?Patient Name: ?Date of Service: ?Andrew [name], Andrew [name]. 02/26/2022 8:00 A M ?Medical Record Number: [id number] ?Patient Account Number: [id number] ?Date of Birth/Sex: ?Treating RN: ?15-Aug-1956 (66 y.o. [name] ?Primary Care Makenli Derstine: [name] ?Other Clinician: [name] ?Referring Bailyn Spackman: ?Treating Maurico Perrell/Extender: [name] ?[name] ?Weeks in Treatment: 6 ?HBO Safety Checklist Items ?Safety Checklist ?Consent Form Signed ?Patient voided / foley secured and emptied ?When did you last eato 0630 ?Last dose of injectable or oral agent After treatment ?Ostomy pouch emptied and vented if applicable ?NA ?All implantable devices assessed, documented and approved ?NA ?Intravenous access site secured and place ?NA ?Valuables secured ?Linens and cotton and cotton/polyester blend (less than 51% polyester) ?Personal oil-based products / skin lotions / body lotions removed ?Wigs or hairpieces removed ?NA ?Smoking or tobacco materials removed ?Books / newspapers / magazines / loose paper removed ?Cologne, aftershave, perfume and deodorant removed ?Jewelry removed (may wrap wedding band) ?NA ?Make-up removed ?NA ?Hair care products removed ?Battery operated devices (external)  removed ?Heating patches and chemical warmers removed ?Titanium eyewear removed ?NA ?Nail polish cured greater than 10 hours ?NA ?Casting material cured greater than 10 hours ?NA ?Hearing aids removed ?NA ?Loose dentures or partials removed ?NA ?Prosthetics have been removed ?NA ?Patient demonstrates correct  use of air break device (if applicable) ?Patient concerns have been addressed ?Patient grounding bracelet on and cord attached to chamber ?Specifics for Inpatients (complete in addition to above) ?Medication sheet sent with patient ?NA ?Intravenous medications needed or due during therapy sent with patient ?NA ?Drainage tubes ([name].g. nasogastric tube or chest tube secured and vented) ?NA ?Endotracheal or Tracheotomy tube secured ?Cuff deflated of air and inflated with saline ?NA ?Airway suctioned ?NA ?Notes ?Paper version used prior to treatment ?Electronic Signature(s) ?Signed: 02/26/2022 8:47:29 AM By: [name] EMT ?Entered By: [name] on 02/26/2022 08:47:28 ?

## 2022-02-27 ENCOUNTER — Encounter (HOSPITAL_BASED_OUTPATIENT_CLINIC_OR_DEPARTMENT_OTHER): Payer: 59 | Admitting: General Surgery

## 2022-02-27 DIAGNOSIS — C61 Malignant neoplasm of prostate: Secondary | ICD-10-CM | POA: Diagnosis not present

## 2022-02-27 LAB — GLUCOSE, CAPILLARY
Glucose-Capillary: 142 mg/dL — ABNORMAL HIGH (ref 70–99)
Glucose-Capillary: 162 mg/dL — ABNORMAL HIGH (ref 70–99)

## 2022-02-27 NOTE — Progress Notes (Signed)
JLON, BETKER ([id number]) ?Visit Report for 02/27/2022 ?Problem List Details ?Patient Name: Date of Service: ?[name], [name] 02/27/2022 8:00 A M ?Medical Record Number: [id number] ?Patient Account Number: [id number] ?Date of Birth/Sex: Treating RN: ?March 06, 1956 (66 y.o. 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 02/27/2022 ?Medical Record Number: [id number] ?Patient Account Number: [id number] ?Date of Birth/Sex: Treating RN: ?01/13/1956 (66 y.o. 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 ?I10 Essential (primary) hypertension ?Facility Procedures ?CPT4 Code: [id number] ?Description: G0277-(Facility Use Only) HBOT full body chamber, 31mn , ICD-10 Diagnosis Description C61 Malignant  neoplasm of prostate 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 IU13 ?Essential (primary) hypertension ?Modifier: ?Quantity: 5 ?Physician Procedures ?: CPT4 Code Description Modifier [id number] 72536- WC PHYS HYPERBARIC OXYGEN THERAPY ICD-10 Diagnosis Description C61 Malignant neoplasm of prostate N30.41 Irradiation cystitis with hematuria E11.59 Type 2 diabetes mellitus with other circulatory  ?complications IU44Essential (primary) hypertension ?Quantity: 1 ?Electronic Signature(s) ?Signed: 02/27/2022 12:38:26 PM By: CFredirick MaudlinMD FACS ?Previous Signature: 02/27/2022 11:08:23 AM Version By: GValeria BatmanEMT ?Entered By: CFredirick Maudlinon 02/27/2022 12:38:26 ?

## 2022-02-27 NOTE — Progress Notes (Addendum)
ESMERALDA, BLANFORD ([id number]) ?Visit Report for 02/27/2022 ?Arrival Information Details ?Patient Name: Date of Service: ?[name], [name] 02/27/2022 8:00 A M ?Medical Record Number: [id number] ?Patient Account Number: [id number] ?Date of Birth/Sex: Treating RN: ?08/01/1956 (66 y.o. M) Rolin Barry, Bobbi ?Primary Care Emiliya Chretien: [name] ?Other Clinician: [name] ?Referring Patriciaann Rabanal: ?Treating Jonty Morrical/Extender: [name] ?[name] ?Weeks in Treatment: 6 ?Visit Information History Since Last Visit ?All ordered tests and consults were completed: Yes ?Patient Arrived: Ambulatory ?Added or deleted any medications: No ?Arrival Time: 07:46 ?Any new allergies or adverse reactions: No ?Accompanied By: None ?Had a fall or experienced change in No ?Transfer Assistance: None ?activities of daily living that may affect ?Patient Identification Verified: Yes ?risk of falls: ?Secondary Verification Process Completed: Yes ?Signs or symptoms of abuse/neglect since last visito No ?Patient Requires Transmission-Based Precautions: No ?Hospitalized since last visit: No ?Patient Has Alerts: No ?Implantable device outside of the clinic excluding No ?cellular tissue based products placed in the center ?since last visit: ?Pain Present Now: No ?Notes ?Paper version used prior to treatment ?Electronic Signature(s) ?Signed: 02/27/2022 9:09:03 AM By: [name] EMT ?Entered By: [name] on 02/27/2022 09:09:02 ?-------------------------------------------------------------------------------- ?Encounter Discharge Information Details ?Patient Name: Date of Service: ?[name], [name] 02/27/2022 8:00 A M ?Medical Record Number: [id number] ?Patient Account Number: [id number] ?Date of Birth/Sex: Treating RN: ?[birth year] (66 y.o. M) Rolin Barry, Bobbi ?Primary Care Johannah Rozas: [name] ?Other Clinician: [name] ?Referring Chantele Corado: ?Treating Byard Carranza/Extender: [name] ?[name] ?Weeks in  Treatment: 6 ?Encounter Discharge Information Items ?Discharge Condition: Stable ?Ambulatory Status: Ambulatory ?Discharge Destination: Home ?Transportation: Private Auto ?Accompanied By: None ?Schedule Follow-up Appointment: Yes ?Clinical Summary of Care: ?Electronic Signature(s) ?Signed: 02/27/2022 11:09:18 AM By: [name] EMT ?Entered By: [name] on 02/27/2022 11:09:17 ?-------------------------------------------------------------------------------- ?Patient/Caregiver Education Details ?Patient Name: ?Date of Service: ?[name], [name] 3/9/2023andnbsp8:00 A M ?Medical Record Number: [id number] ?Patient Account Number: [id number] ?Date of Birth/Gender: ?Treating RN: ?1956/09/04 (66 y.o. M) Rolin Barry, Bobbi ?Primary Care Physician: [name] ?Other Clinician: [name] ?Referring Physician: ?Treating Physician/Extender: [name] ?[name] ?Weeks in Treatment: 6 ?Education Assessment ?Education Provided To: ?Patient ?Education Topics Provided ?Electronic Signature(s) ?Signed: 02/27/2022 5:59:46 PM By: [name] EMT ?Entered By: [name] on 02/27/2022 11:08:59 ?-------------------------------------------------------------------------------- ?Vitals Details ?Patient Name: ?Date of Service: ?[name], [name] 02/27/2022 8:00 A M ?Medical Record Number: [id number] ?Patient Account Number: [id number] ?Date of Birth/Sex: ?Treating RN: ?04-02-1956 (66 y.o. M) Rolin Barry, Bobbi ?Primary Care Vashon Arch: [name] ?Other Clinician: [name] ?Referring Laurel Smeltz: ?Treating Genisis Sonnier/Extender: [name] ?[name] ?Weeks in Treatment: 6 ?Vital Signs ?Time Taken: 07:46 ?Temperature (??F): 98.1 ?Height (in): 72 ?Pulse (bpm): 63 ?Weight (lbs): 260 ?Respiratory Rate (breaths/min): 16 ?Body Mass Index (BMI): 35.3 ?Blood Pressure (mmHg): 118/84 ?Capillary Blood Glucose (mg/dl): 142 ?Reference Range: 80 - 120 mg / dl ?Notes ?Paper version used prior to treatment.  The patient was asked if he had any problems with his ears today. He stated no. ?Electronic Signature(s) ?Signed: 02/27/2022 9:09:53 AM By: [name] EMT ?Entered By: [name] on 02/27/2022 09:09:53 ?

## 2022-02-27 NOTE — Progress Notes (Addendum)
Andrew [name] ([id number]) ?Visit Report for 02/27/2022 ?HBO Details ?Patient Name: Date of Service: ?Andrew [name], Andrew [name]. 02/27/2022 8:00 A M ?Medical Record Number: [id number] ?Patient Account Number: [id number] ?Date of Birth/Sex: Treating RN: ?[birth year] (66 y.o. M) Rolin Barry, Bobbi ?Primary Care Cambren Helm: [name] ?Other Clinician: [name] ?Referring Oceane Fosse: ?Treating Kamarii Carton/Extender: [name] ?[name] ?Weeks in Treatment: 6 ?HBO Treatment Course Details ?Treatment Course Number: 1 ?Ordering Ryleah Miramontes: [name] ?T Treatments Ordered: ?otal 40 HBO Treatment Start Date: 01/20/2022 ?HBO Indication: ?Late Effect of Radiation ?HBO Treatment Details ?Treatment Number: 28 ?Patient Type: Outpatient ?Chamber Type: Monoplace ?Chamber Serial #: [id number] ?Treatment Protocol: 2.5 ATA with 90 minutes oxygen, with two 5 minute air breaks ?Treatment Details ?Compression Rate Down: 1.0 psi / minute De-Compression Rate Up: ?A breaks and breathing ?ir ?Compress Tx Pressure periods Decompress Decompress ?Begins Reached (leave unused spaces Begins Ends ?blank) ?Chamber Pressure (ATA 1 2.5 2.5 2.5 2.5 2.5 - - 2.5 1 ?) ?Clock Time (24 hr) 08:06 08:30 09:00 09:05 09:35 09:40 - - 10:10 10:35 ?Treatment Length: 149 (minutes) ?Treatment Segments: 5 ?Vital Signs ?Capillary Blood Glucose Reference Range: 80 - 120 mg / dl ?HBO Diabetic Blood Glucose Intervention Range: <131 mg/dl or >249 mg/dl ?Time Vitals Blood Respiratory Capillary Blood Glucose Pulse Action ?Type: ?Pulse: Temperature: ?Taken: ?Pressure: ?Rate: ?Glucose (mg/dl): ?Meter #: Oximetry (%) Taken: ?Pre 07:46 118/84 63 16 98.1 142 ?Post 10:38 121/83 63 16 97.5 162 ?Treatment Response ?Treatment Toleration: Well ?Treatment Completion Status: Treatment Completed without Adverse Event ?Treatment Notes ?Paper version used prior to treatment. The patient did not have any problems clearing his ears today. After treatment, I asked him if he had any  ear pain. He ?stated no. ?Physician HBO Attestation: ?I certify that I supervised this HBO treatment in accordance with Medicare ?guidelines. A trained emergency response team is readily available per Yes ?hospital policies and procedures. ?Continue HBOT as ordered. Yes ?Electronic Signature(s) ?Signed: 02/27/2022 12:38:16 PM By: [name] ?Previous Signature: 02/27/2022 11:07:51 AM Version By: [name] EMT ?Previous Signature: 02/27/2022 10:13:21 AM Version By: [name] EMT ?Previous Signature: 02/27/2022 9:12:26 AM Version By: [name] EMT ?Entered By: [name] on 02/27/2022 12:38:16 ?-------------------------------------------------------------------------------- ?HBO Safety Checklist Details ?Patient Name: ?Date of Service: ?Andrew [name], Andrew [name]. 02/27/2022 8:00 A M ?Medical Record Number: [id number] ?Patient Account Number: [id number] ?Date of Birth/Sex: ?Treating RN: ?Feb 21, 1956 (66 y.o. M) Rolin Barry, Bobbi ?Primary Care Navjot Pilgrim: [name] ?Other Clinician: [name] ?Referring Bjorn Hallas: ?Treating Kadynce Bonds/Extender: [name] ?[name] ?Weeks in Treatment: 6 ?HBO Safety Checklist Items ?Safety Checklist ?Consent Form Signed ?Patient voided / foley secured and emptied ?When did you last eato 0630 ?Last dose of injectable or oral agent After treatment ?Ostomy pouch emptied and vented if applicable ?NA ?All implantable devices assessed, documented and approved ?NA ?Intravenous access site secured and place ?NA ?Valuables secured ?Linens and cotton and cotton/polyester blend (less than 51% polyester) ?Personal oil-based products / skin lotions / body lotions removed ?Wigs or hairpieces removed ?NA ?Smoking or tobacco materials removed ?Books / newspapers / magazines / loose paper removed ?Cologne, aftershave, perfume and deodorant removed ?Jewelry removed (may wrap wedding band) ?NA ?Make-up removed ?NA ?Hair care products removed ?Battery operated devices  (external) removed ?Heating patches and chemical warmers removed ?Titanium eyewear removed ?NA ?Nail polish cured greater than 10 hours ?NA ?Casting material cured greater than 10 hours ?NA ?Hearing aids removed ?NA ?Loose dentures or partials removed ?NA ?Prosthetics have been removed ?  NA ?Patient demonstrates correct use of air break device (if applicable) ?Patient concerns have been addressed ?Patient grounding bracelet on and cord attached to chamber ?Specifics for Inpatients (complete in addition to above) ?Medication sheet sent with patient ?NA ?Intravenous medications needed or due during therapy sent with patient ?NA ?Drainage tubes ([name].g. nasogastric tube or chest tube secured and vented) ?NA ?Endotracheal or Tracheotomy tube secured ?NA ?Cuff deflated of air and inflated with saline ?NA ?Airway suctioned ?NA ?Electronic Signature(s) ?Signed: 02/27/2022 9:11:40 AM By: [name] EMT ?Entered By: [name] on 02/27/2022 09:11:39 ?

## 2022-02-28 ENCOUNTER — Other Ambulatory Visit: Payer: Self-pay

## 2022-02-28 ENCOUNTER — Encounter (HOSPITAL_BASED_OUTPATIENT_CLINIC_OR_DEPARTMENT_OTHER): Payer: 59 | Admitting: Internal Medicine

## 2022-02-28 DIAGNOSIS — I10 Essential (primary) hypertension: Secondary | ICD-10-CM | POA: Diagnosis not present

## 2022-02-28 DIAGNOSIS — N3041 Irradiation cystitis with hematuria: Secondary | ICD-10-CM | POA: Diagnosis not present

## 2022-02-28 DIAGNOSIS — C61 Malignant neoplasm of prostate: Secondary | ICD-10-CM | POA: Diagnosis not present

## 2022-02-28 DIAGNOSIS — E1159 Type 2 diabetes mellitus with other circulatory complications: Secondary | ICD-10-CM | POA: Diagnosis not present

## 2022-02-28 LAB — GLUCOSE, CAPILLARY
Glucose-Capillary: 165 mg/dL — ABNORMAL HIGH (ref 70–99)
Glucose-Capillary: 205 mg/dL — ABNORMAL HIGH (ref 70–99)

## 2022-02-28 NOTE — Progress Notes (Addendum)
Andrew [name], Andrew [name] ([id number]) ?Visit Report for 02/28/2022 ?Arrival Information Details ?Patient Name: Date of Service: ?Andrew Estimable, [name] 02/28/2022 7:00 A M ?Medical Record Number: [id number] ?Patient Account Number: [id number] ?Date of Birth/Sex: Treating RN: ?[birth year] (66 y.o. Andrew [name] ?Primary Care Niema Carrara: [name] ?Other Clinician: [name] ?Referring Gisella Alwine: ?Treating [name]/Extender: [name] ?[name] ?Weeks in Treatment: 6 ?Visit Information History Since Last Visit ?All ordered tests and consults were completed: Yes ?Patient Arrived: Ambulatory ?Added or deleted any medications: No ?Arrival Time: 06:55 ?Any new allergies or adverse reactions: No ?Accompanied By: None ?Had a fall or experienced change in No ?Transfer Assistance: None ?activities of daily living that may affect ?Patient Identification Verified: Yes ?risk of falls: ?Secondary Verification Process Completed: Yes ?Signs or symptoms of abuse/neglect since last visito No ?Patient Requires Transmission-Based Precautions: No ?Hospitalized since last visit: No ?Patient Has Alerts: No ?Implantable device outside of the clinic excluding No ?cellular tissue based products placed in the center ?since last visit: ?Pain Present Now: No ?Notes ?Paper version used prior to treatment ?Electronic Signature(s) ?Signed: 02/28/2022 7:48:42 AM By: [name] EMT ?Entered By: [name] on 02/28/2022 07:48:42 ?-------------------------------------------------------------------------------- ?Encounter Discharge Information Details ?Patient Name: Date of Service: ?Andrew Estimable, [name] 02/28/2022 7:00 A M ?Medical Record Number: [id number] ?Patient Account Number: [id number] ?Date of Birth/Sex: Treating RN: ?[birth year] (66 y.o. Andrew [name] ?Primary Care Layne Lebon: [name] ?Other Clinician: [name] ?Referring Renesme Kerrigan: ?Treating Gabe Glace/Extender: [name] ?[name] ?Weeks in  Treatment: 6 ?Encounter Discharge Information Items ?Discharge Condition: Stable ?Ambulatory Status: Ambulatory ?Discharge Destination: Home ?Transportation: Private Auto ?Accompanied By: None ?Schedule Follow-up Appointment: Yes ?Clinical Summary of Care: ?Electronic Signature(s) ?Signed: 02/28/2022 10:08:19 AM By: [name] EMT ?Entered By: [name] on 02/28/2022 10:08:19 ?-------------------------------------------------------------------------------- ?Patient/Caregiver Education Details ?Patient Name: ?Date of Service: ?Andrew YCE, [name] 3/10/2023andnbsp7:00 A M ?Medical Record Number: [id number] ?Patient Account Number: [id number] ?Date of Birth/Gender: ?Treating RN: ?Mar 27, 1956 (66 y.o. Andrew [name] ?Primary Care Physician: [name] ?Other Clinician: [name] ?Referring Physician: ?Treating Physician/Extender: [name] ?[name] ?Weeks in Treatment: 6 ?Education Assessment ?Education Provided To: ?Patient ?Education Topics Provided ?Electronic Signature(s) ?Signed: 02/28/2022 12:42:56 PM By: [name] EMT ?Entered By: [name] on 02/28/2022 10:08:03 ?-------------------------------------------------------------------------------- ?Vitals Details ?Patient Name: ?Date of Service: ?Andrew YCE, [name] 02/28/2022 7:00 A M ?Medical Record Number: [id number] ?Patient Account Number: [id number] ?Date of Birth/Sex: ?Treating RN: ?July 04, 1956 (66 y.o. Andrew [name] ?Primary Care Katlyne Nishida: [name] ?Other Clinician: [name] ?Referring Marzetta Lanza: ?Treating Andrew [name]/Extender: [name] ?[name] ?Weeks in Treatment: 6 ?Vital Signs ?Time Taken: 07:00 ?Temperature (??F): 98.3 ?Height (in): 72 ?Pulse (bpm): 81 ?Weight (lbs): 260 ?Respiratory Rate (breaths/min): 16 ?Body Mass Index (BMI): 35.3 ?Blood Pressure (mmHg): 125/83 ?Reference Range: 80 - 120 mg / dl ?Notes ?Paper version used prior to treatment. The patient was asked if he had  any problems with his ears today. He stated no. ?Electronic Signature(s) ?Signed: 02/28/2022 7:49:33 AM By: [name] EMT ?Entered By: [name] on 02/28/2022 07:49:32 ?

## 2022-02-28 NOTE — Progress Notes (Addendum)
Andrew [name], Andrew [name] ([id number]) ?Visit Report for 02/28/2022 ?HBO Details ?Patient Name: Date of Service: ?[name], [name] 02/28/2022 7:00 A M ?Medical Record Number: [id number] ?Patient Account Number: [id number] ?Date of Birth/Sex: Treating RN: ?03/21/1956 (66 y.o. 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 ?HBO Treatment Details ?Treatment Number: 29 ?Patient Type: Outpatient ?Chamber Type: Monoplace ?Chamber Serial #: [id number] ?Treatment Protocol: 2.5 ATA with 90 minutes oxygen, with two 5 minute air breaks ?Treatment Details ?Compression Rate Down: 1.0 psi / minute De-Compression Rate Up: ?A breaks and breathing ?ir ?Compress Tx Pressure periods Decompress Decompress ?Begins Reached (leave unused spaces Begins Ends ?blank) ?Chamber Pressure (ATA 1 2.5 2.5 2.5 2.5 2.5 - - 2.5 1 ?) ?Clock Time (24 hr) 07:04 07:25 07:55 08:00 08:30 08:30 - - 09:06 09:21 ?Treatment Length: 137 (minutes) ?Treatment Segments: 5 ?Vital Signs ?Capillary Blood Glucose Reference Range: 80 - 120 mg / dl ?HBO Diabetic Blood Glucose Intervention Range: <131 mg/dl or >249 mg/dl ?Time Vitals Blood Respiratory Capillary Blood Glucose Pulse Action ?Type: ?Pulse: Temperature: ?Taken: ?Pressure: ?Rate: ?Glucose (mg/dl): ?Meter #: Oximetry (%) Taken: ?Pre 07:00 125/83 81 16 98.3 ?Post 09:33 126/81 66 16 98.3 205 ?Treatment Response ?Treatment Toleration: Well ?Treatment Completion Status: Treatment Completed without Adverse Event ?Treatment Notes ?Paper version was used prior to the treatment. The patient was asked after treatment if he had any problems with his ears after treatment. He stated  No. ?Physician HBO Attestation: ?I certify that I supervised this HBO treatment in accordance with Medicare ?guidelines. A trained emergency response team is readily available per Yes ?hospital policies and procedures. ?Continue HBOT as ordered. Yes ?Electronic Signature(s) ?Signed: 03/03/2022 4:05:33 PM By: [name] DO ?Previous Signature: 02/28/2022 10:07:02 AM Version By: [name] EMT ?Previous Signature: 02/28/2022 7:55:15 AM Version By: [name] EMT ?Entered By: [name] on 03/03/2022 15:52:04 ?-------------------------------------------------------------------------------- ?HBO Safety Checklist Details ?Patient Name: ?Date of Service: ?[name], [name] 02/28/2022 7:00 A M ?Medical Record Number: [id number] ?Patient Account Number: [id number] ?Date of Birth/Sex: ?Treating RN: ?10-01-1956 (66 y.o. [name] ?Primary Care Roshanda Balazs: [name] ?Other Clinician: [name] ?Referring Carra Brindley: ?Treating Judythe Postema/Extender: [name] ?[name] ?Weeks in Treatment: 6 ?HBO Safety Checklist Items ?Safety Checklist ?Consent Form Signed ?Patient voided / foley secured and emptied ?When did you last eato 0545 ?Last dose of injectable or oral agent After treatment ?Ostomy pouch emptied and vented if applicable ?NA ?All implantable devices assessed, documented and approved ?NA ?Intravenous access site secured and place ?NA ?Valuables secured ?Linens and cotton and cotton/polyester blend (less than 51% polyester) ?Personal oil-based products / skin lotions / body lotions removed ?Wigs or hairpieces removed ?NA ?Smoking or tobacco materials removed ?Books / newspapers / magazines / loose paper removed ?Cologne, aftershave, perfume and deodorant removed ?Jewelry removed (may wrap wedding band) ?NA ?Make-up removed ?NA ?Hair care products removed ?Battery operated devices (external) removed ?Heating patches and chemical warmers removed ?Titanium eyewear  removed ?NA ?Nail polish cured greater than 10 hours ?NA ?Casting material cured greater than 10 hours ?NA ?Hearing aids removed ?NA ?Loose dentures or partials removed ?NA ?Prosthetics have been removed ?NA ?Patient demonstrates correct use of air break device (if applicable) ?Patient concerns have been addressed ?  Patient grounding bracelet on and cord attached to chamber ?Specifics for Inpatients (complete in addition to above) ?Medication sheet sent with patient ?NA ?Intravenous medications needed or due during therapy sent with patient ?NA ?Drainage tubes (e.g. nasogastric tube or chest tube secured and vented) ?NA ?Endotracheal or Tracheotomy tube secured ?NA ?Cuff deflated of air and inflated with saline ?NA ?Airway suctioned ?NA ?Notes ?Paper version used prior to treatment. ?Electronic Signature(s) ?Signed: 02/28/2022 7:54:20 AM By: [name] EMT ?Entered By: [name] on 02/28/2022 07:54:20 ?

## 2022-03-03 ENCOUNTER — Other Ambulatory Visit: Payer: Self-pay

## 2022-03-03 ENCOUNTER — Encounter (HOSPITAL_BASED_OUTPATIENT_CLINIC_OR_DEPARTMENT_OTHER): Payer: 59 | Admitting: General Surgery

## 2022-03-03 DIAGNOSIS — C61 Malignant neoplasm of prostate: Secondary | ICD-10-CM | POA: Diagnosis not present

## 2022-03-03 LAB — GLUCOSE, CAPILLARY
Glucose-Capillary: 175 mg/dL — ABNORMAL HIGH (ref 70–99)
Glucose-Capillary: 180 mg/dL — ABNORMAL HIGH (ref 70–99)

## 2022-03-03 NOTE — Progress Notes (Signed)
Andrew [name], Andrew [name] ([id number]) ?Visit Report for 03/03/2022 ?Problem List Details ?Patient Name: Date of Service: ?[name], [name] 03/03/2022 8:00 A M ?Medical Record Number: [id number] ?Patient Account Number: [id number] ?Date of Birth/Sex: Treating RN: ?1956/08/01 (66 y.o. 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 03/03/2022 ?Medical Record Number: [id number] ?Patient Account Number: [id number] ?Date of Birth/Sex: Treating RN: ?02-Feb-1956 (66 y.o. 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 ?I10 Essential (primary) hypertension ?Facility Procedures ?CPT4 Code: 32440102 ?Description: G0277-(Facility Use Only) HBOT full body chamber, 47mn , ICD-10 Diagnosis Description C61 Malignant  neoplasm of prostate 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 IV25 ?Essential (primary) hypertension ?Modifier: ?Quantity: 5 ?Physician Procedures ?: CPT4 Code Description Modifier [id number] PHYS HYPERBARIC OXYGEN THERAPY ICD-10 Diagnosis Description C61 Malignant neoplasm of prostate N30.41 Irradiation cystitis with hematuria E11.59 Type 2 diabetes mellitus with other circulatory  ?complications IZ56Essential (primary) hypertension ?Quantity: 1 ?Electronic Signature(s) ?Signed: 03/03/2022 5:03:27 PM By: CFredirick MaudlinMD FACS ?Previous Signature: 03/03/2022 1:02:10 PM Version By: GValeria BatmanEMT ?Entered By: CFredirick Maudlinon 03/03/2022 17:03:27 ?

## 2022-03-03 NOTE — Progress Notes (Addendum)
[name], [name] ([id number]) ?Visit Report for 03/03/2022 ?HBO Details ?Patient Name: Date of Service: ?[name], [name] 03/03/2022 8:00 A M ?Medical Record Number: [id number] ?Patient Account Number: [id number] ?Date of Birth/Sex: Treating RN: ?February 28, 1956 (66 y.o. Andrew [name] ?Primary Care Moorea Boissonneault: [name] ?Other Clinician: [name] ?Referring Kramer Hanrahan: ?Treating Orrie Schubert/Extender: [name] ?[name] ?Weeks in Treatment: 7 ?HBO Treatment Course Details ?Treatment Course Number: 1 ?Ordering Nayla Dias: [name] ?T Treatments Ordered: ?otal 40 HBO Treatment Start Date: 01/20/2022 ?HBO Indication: ?Late Effect of Radiation ?HBO Treatment Details ?Treatment Number: 30 ?Patient Type: Outpatient ?Chamber Type: Monoplace ?Chamber Serial #: [id number] ?Treatment Protocol: 2.5 ATA with 90 minutes oxygen, with two 5 minute air breaks ?Treatment Details ?Compression Rate Down: 1.0 psi / minute ?De-Compression Rate Up: 1.0 psi / minute ?A breaks and breathing ?ir ?Compress Tx Pressure periods Decompress Decompress ?Begins Reached (leave unused spaces Begins Ends ?blank) ?Chamber Pressure (ATA 1 2.5 2.5 2.5 2.5 2.5 - - 2.5 1 ?) ?Clock Time (24 hr) 08:05 08:27 08:56 09:02 09:32 09:37 - - 10:07 10:32 ?Treatment Length: 147 (minutes) ?Treatment Segments: 5 ?Vital Signs ?Capillary Blood Glucose Reference Range: 80 - 120 mg / dl ?HBO Diabetic Blood Glucose Intervention Range: <131 mg/dl or >249 mg/dl ?Time Vitals Blood Respiratory Capillary Blood Glucose Pulse Action ?Type: ?Pulse: Temperature: ?Taken: ?Pressure: ?Rate: ?Glucose (mg/dl): ?Meter #: Oximetry (%) Taken: ?Pre 08:01 119/75 68 16 97.8 145 ?Post 10:34 107/79 60 14 98.1 180 ?Treatment Response ?Treatment Toleration: Well ?Treatment Completion Status: Treatment Completed without Adverse Event ?Treatment Notes ?Paper version used prior to the treatment. The patient's treatment went well today. The patient had no problems clearing his  ears. ?Physician HBO Attestation: ?I certify that I supervised this HBO treatment in accordance with Medicare ?guidelines. A trained emergency response team is readily available per Yes ?hospital policies and procedures. ?Continue HBOT as ordered. Yes ?Electronic Signature(s) ?Signed: 03/03/2022 5:03:18 PM By: [name] ?Previous Signature: 03/03/2022 1:01:15 PM Version By: [name] EMT ?Previous Signature: 03/03/2022 8:44:31 AM Version By: [name] EMT ?Entered By: [name] on 03/03/2022 17:03:18 ?-------------------------------------------------------------------------------- ?HBO Safety Checklist Details ?Patient Name: ?Date of Service: ?[name], [name] 03/03/2022 8:00 A M ?Medical Record Number: [id number] ?Patient Account Number: [id number] ?Date of Birth/Sex: ?Treating RN: ?May 29, 1956 (66 y.o. Andrew [name] ?Primary Care Tayte Mcwherter: [name] ?Other Clinician: [name] ?Referring Brecken Walth: ?Treating Rankin Coolman/Extender: [name] ?[name] ?Weeks in Treatment: 7 ?HBO Safety Checklist Items ?Safety Checklist ?Consent Form Signed ?Patient voided / foley secured and emptied ?When did you last eato 0630 ?Last dose of injectable or oral agent After treatment ?Ostomy pouch emptied and vented if applicable ?NA ?All implantable devices assessed, documented and approved ?NA ?Intravenous access site secured and place ?NA ?Valuables secured ?Linens and cotton and cotton/polyester blend (less than 51% polyester) ?Personal oil-based products / skin lotions / body lotions removed ?Wigs or hairpieces removed ?NA ?Smoking or tobacco materials removed ?Books / newspapers / magazines / loose paper removed ?Cologne, aftershave, perfume and deodorant removed ?Jewelry removed (may wrap wedding band) ?NA ?Make-up removed ?NA ?Hair care products removed ?NA ?Battery operated devices (external) removed ?Heating patches and chemical warmers removed ?Titanium eyewear  removed ?NA ?Nail polish cured greater than 10 hours ?NA ?Casting material cured greater than 10 hours ?NA ?Hearing aids removed ?NA ?Loose dentures or partials removed ?NA ?Prosthetics have been removed ?NA ?Patient demonstrates correct use of air break device (if applicable) ?Patient concerns have been  addressed ?Patient grounding bracelet on and cord attached to chamber ?Specifics for Inpatients (complete in addition to above) ?Medication sheet sent with patient ?NA ?Intravenous medications needed or due during therapy sent with patient ?NA ?Drainage tubes (e.g. nasogastric tube or chest tube secured and vented) ?NA ?Endotracheal or Tracheotomy tube secured ?NA ?Cuff deflated of air and inflated with saline ?NA ?Airway suctioned ?NA ?Electronic Signature(s) ?Signed: 03/03/2022 8:12:50 AM By: [name] EMT ?Entered By: [name] on 03/03/2022 08:12:49 ?

## 2022-03-03 NOTE — Progress Notes (Signed)
Andrew [name], Andrew [name] ([id number]) ?Visit Report for 02/28/2022 ?Problem List Details ?Patient Name: Date of Service: ?[name], [name] 02/28/2022 7:00 A M ?Medical Record Number: [id number] ?Patient Account Number: [id number] ?Date of Birth/Sex: Treating RN: ?Dec 19, 1956 (66 y.o. 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 02/28/2022 ?Medical Record Number: [id number] ?Patient Account Number: [id number] ?Date of Birth/Sex: Treating RN: ?1956-07-17 (66 y.o. 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 ?I10 Essential (primary) hypertension ?Facility Procedures ?CPT4 Code: 75170017 ?Description: G0277-(Facility Use Only) HBOT full body chamber, 80mn , ICD-10 Diagnosis Description C61 Malignant neoplasm of prostate N30.41  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

## 2022-03-03 NOTE — Progress Notes (Signed)
Andrew [name], REALE ([id number]) Visit Report for 03/03/2022 Arrival Information Details Patient Name: Date of Service: [name], DO [place] LD E. 03/03/2022 8:00 A M Medical Record Number: [id number] Patient Account Number: [id number] Date of Birth/Sex: Treating RN: 1956-04-14 (66 y.o. Andrew Eva, [name] [place] Primary Care Levern Pitter: [name] Other Clinician: [name] Referring Venda Dice: Treating Perpetua Elling/Extender: Berneta Levins in Treatment: 7 Visit Information History Since Last Visit All ordered tests and consults were completed: Yes Patient Arrived: Ambulatory Added or deleted any medications: No Arrival Time: 07:48 Any new allergies or adverse reactions: No Accompanied By: None Had a fall or experienced change in No Transfer Assistance: None activities of daily living that may affect Patient Identification Verified: Yes risk of falls: Secondary Verification Process Completed: Yes Signs or symptoms of abuse/neglect since last visito No Patient Requires Transmission-Based Precautions: No Hospitalized since last visit: No Patient Has Alerts: No Implantable device outside of the clinic excluding No cellular tissue based products placed in the center since last visit: Pain Present Now: No Electronic Signature(s) Signed: 03/03/2022 8:11:13 AM By: [name] EMT Entered By: [name] on 03/03/2022 08:11:13 -------------------------------------------------------------------------------- Vitals Details Patient Name: Date of Service: [name], [name] 03/03/2022 8:00 A M Medical Record Number: [id number] Patient Account Number: [id number] Date of Birth/Sex: Treating RN: 1956-02-16 (66 y.o. Andrew [name] Primary Care Vetra Shinall: [name] Other Clinician: [name] Referring Rosezetta Balderston: Treating Bobby Andrew [name]/Extender: Berneta Levins in Treatment: 7 Vital Signs Time Taken: 08:01 Temperature (F):  97.8 Height (in): 72 Pulse (bpm): 68 Weight (lbs): 260 Respiratory Rate (breaths/min): 16 Body Mass Index (BMI): 35.3 Blood Pressure (mmHg): 119/75 Capillary Blood Glucose (mg/dl): 145 Reference Range: 80 - 120 mg / dl Electronic Signature(s) Signed: 03/03/2022 8:11:46 AM By: [name] EMT Entered By: [name] on 03/03/2022 08:11:46

## 2022-03-04 ENCOUNTER — Encounter (HOSPITAL_BASED_OUTPATIENT_CLINIC_OR_DEPARTMENT_OTHER): Payer: 59 | Admitting: General Surgery

## 2022-03-04 DIAGNOSIS — C61 Malignant neoplasm of prostate: Secondary | ICD-10-CM | POA: Diagnosis not present

## 2022-03-04 LAB — GLUCOSE, CAPILLARY
Glucose-Capillary: 196 mg/dL — ABNORMAL HIGH (ref 70–99)
Glucose-Capillary: 189 mg/dL — ABNORMAL HIGH (ref 70–99)

## 2022-03-04 NOTE — Progress Notes (Signed)
JA, OHMAN ([id number]) ?Visit Report for 03/04/2022 ?HBO Details ?Patient Name: Date of Service: ?[name], [name] 03/04/2022 8:00 A M ?Medical Record Number: [id number] ?Patient Account Number: [id number] ?Date of Birth/Sex: Treating RN: ?04/20/1956 (66 y.o. [name] ?Primary Care Cerria Randhawa: [name] ?Other Clinician: [name] ?Referring Jin Shockley: ?Treating Fumiye Lubben/Extender: [name] ?[name] ?Weeks in Treatment: 7 ?HBO Treatment Course Details ?Treatment Course Number: 1 ?Ordering Pattijo Juste: [name] ?T Treatments Ordered: ?otal 40 HBO Treatment Start Date: 01/20/2022 ?HBO Indication: ?Late Effect of Radiation ?HBO Treatment Details ?Treatment Number: 58 ?Patient Type: Outpatient ?Chamber Type: Monoplace ?Chamber Serial #: [id number] ?Treatment Protocol: 2.5 ATA with 90 minutes oxygen, with two 5 minute air breaks ?Treatment Details ?Compression Rate Down: 1.0 psi / minute De-Compression Rate Up: ?A breaks and breathing ?ir ?Compress Tx Pressure periods Decompress Decompress ?Begins Reached (leave unused spaces Begins Ends ?blank) ?Chamber Pressure (ATA 1 2.5 2.5 2.5 2.5 2.5 - - 2.5 1 ?) ?Clock Time (24 hr) 08:13 08:31 09:01 09:06 09:36 09:41 - - 10:11 10:32 ?Treatment Length: 139 (minutes) ?Treatment Segments: 5 ?Vital Signs ?Capillary Blood Glucose Reference Range: 80 - 120 mg / dl ?HBO Diabetic Blood Glucose Intervention Range: <131 mg/dl or >249 mg/dl ?Time Vitals Blood Respiratory Capillary Blood Glucose Pulse Action ?Type: ?Pulse: Temperature: ?Taken: ?Pressure: ?Rate: ?Glucose (mg/dl): ?Meter #: Oximetry (%) Taken: ?Pre 08:10 121/82 61 18 98.2 196 2 ?Post 10:36 109/73 57 18 98 189 2 ?Treatment Response ?Treatment Toleration: Well ?Treatment Completion Status: Treatment Completed without Adverse Event ?Treatment Notes ?Patient has consistently had problems equalizing middle ear pressure. After performing safety check and safely placing patient in the  chamber, the chamber ?was pressurized at a rate of 1 psi/min until reaching 2 ATA at which point travel rate was increased to approximately 1.25 psi/min. Patient tolerated treatment ?well. During the decompression phase patient travelled at a rate of aproximately 1.25 psi/min until reaching 2 ATA and the rate was decreased to 1 psi/min. ?Patient stated that his ears were great and had no issues. ?Physician HBO Attestation: ?I certify that I supervised this HBO treatment in accordance with Medicare ?guidelines. A trained emergency response team is readily available per Yes ?hospital policies and procedures. ?Continue HBOT as ordered. Yes ?Electronic Signature(s) ?Signed: 03/04/2022 4:41:09 PM By: [name] ?Previous Signature: 03/04/2022 10:47:33 AM Version By: [name] CHT EMT BS ?, , ?Entered By: [name] on 03/04/2022 16:41:08 ?-------------------------------------------------------------------------------- ?HBO Safety Checklist Details ?Patient Name: ?Date of Service: ?[name], [name] 03/04/2022 8:00 A M ?Medical Record Number: [id number] ?Patient Account Number: [id number] ?Date of Birth/Sex: ?Treating RN: ?October 10, 1956 (66 y.o. [name] ?Primary Care Sativa Gelles: [name] ?Other Clinician: [name] ?Referring Tatianna Ibbotson: ?Treating Edy Belt/Extender: [name] ?[name] ?Weeks in Treatment: 7 ?HBO Safety Checklist Items ?Safety Checklist ?Consent Form Signed ?Patient voided / foley secured and emptied ?When did you last eato 0630 ?Last dose of injectable or oral agent 0635 ?Ostomy pouch emptied and vented if applicable ?NA ?All implantable devices assessed, documented and approved ?NA ?Intravenous access site secured and place ?NA ?Valuables secured ?Linens and cotton and cotton/polyester blend (less than 51% polyester) ?Personal oil-based products / skin lotions / body lotions removed ?Wigs or hairpieces removed ?NA ?Smoking or tobacco  materials removed ?NA ?Books / newspapers / magazines / loose paper removed ?Cologne, aftershave, perfume and deodorant removed ?Jewelry removed (may wrap wedding band) ?Make-up removed ?NA ?Hair care products removed ?Battery operated devices (external) removed ?Heating  patches and chemical warmers removed ?Titanium eyewear removed Not wearing eyewear in chamber (titanium) ?Nail polish cured greater than 10 hours ?NA ?Casting material cured greater than 10 hours ?NA ?Hearing aids removed ?NA ?Loose dentures or partials removed ?NA ?Prosthetics have been removed ?NA ?Patient demonstrates correct use of air break device (if applicable) ?Patient concerns have been addressed ?Patient grounding bracelet on and cord attached to chamber ?Specifics for Inpatients (complete in addition to above) ?Medication sheet sent with patient ?NA ?Intravenous medications needed or due during therapy sent with patient ?NA ?Drainage tubes (e.g. nasogastric tube or chest tube secured and vented) ?NA ?Endotracheal or Tracheotomy tube secured ?NA ?Cuff deflated of air and inflated with saline ?NA ?Airway suctioned ?NA ?Notes ?Paper version used prior to treatment. ?Electronic Signature(s) ?Signed: 03/04/2022 10:47:33 AM By: [name] CHT EMT BS ?, , ?Previous Signature: 03/04/2022 10:07:48 AM Version By: [name] CHT EMT BS ?, , ?Entered By: [name] on 03/04/2022 10:23:24 ?

## 2022-03-04 NOTE — Progress Notes (Addendum)
Andrew [name], Andrew [name] ([id number]) ?Visit Report for 03/04/2022 ?Arrival Information Details ?Patient Name: Date of Service: ?[name], [name] 03/04/2022 8:00 A M ?Medical Record Number: [id number] ?Patient Account Number: [id number] ?Date of Birth/Sex: Treating RN: ?22-Sep-1956 (66 y.o. Andrew [name] ?Primary Care An Lannan: [name] ?Other Clinician: [name] ?Referring Rhiley Tarver: ?Treating Adi Seales/Extender: [name] ?[name] ?Weeks in Treatment: 7 ?Visit Information History Since Last Visit ?All ordered tests and consults were completed: Yes ?Patient Arrived: Ambulatory ?Added or deleted any medications: No ?Arrival Time: 07:50 ?Any new allergies or adverse reactions: No ?Accompanied By: self ?Had a fall or experienced change in No ?Transfer Assistance: None ?activities of daily living that may affect ?Patient Identification Verified: Yes ?risk of falls: ?Secondary Verification Process Completed: Yes ?Signs or symptoms of abuse/neglect since last visito No ?Patient Requires Transmission-Based Precautions: No ?Hospitalized since last visit: No ?Patient Has Alerts: No ?Implantable device outside of the clinic excluding No ?cellular tissue based products placed in the center ?since last visit: ?Pain Present Now: No ?Electronic Signature(s) ?Signed: 03/04/2022 10:07:48 AM By: [name] CHT EMT BS ?, , ?Entered By: [name] on 03/04/2022 09:37:33 ?-------------------------------------------------------------------------------- ?Encounter Discharge Information Details ?Patient Name: Date of Service: ?[name], [name] 03/04/2022 8:00 A M ?Medical Record Number: [id number] ?Patient Account Number: [id number] ?Date of Birth/Sex: Treating RN: ?[birth year] (66 y.o. Andrew [name] ?Primary Care Telesforo Brosnahan: [name] ?Other Clinician: [name] ?Referring Kendelle Schweers: ?Treating Flordia Kassem/Extender: [name] ?[name] ?Weeks in Treatment: 7 ?Encounter  Discharge Information Items ?Discharge Condition: Stable ?Ambulatory Status: Ambulatory ?Discharge Destination: Home ?Transportation: Private Auto ?Accompanied By: self ?Schedule Follow-up Appointment: No ?Clinical Summary of Care: ?Electronic Signature(s) ?Signed: 03/04/2022 10:47:33 AM By: [name] CHT EMT BS ?, , ?Entered By: [name] on 03/04/2022 10:47:00 ?-------------------------------------------------------------------------------- ?Vitals Details ?Patient Name: ?Date of Service: ?[name], [name] 03/04/2022 8:00 A M ?Medical Record Number: [id number] ?Patient Account Number: [id number] ?Date of Birth/Sex: ?Treating RN: ?10-15-1956 (66 y.o. Andrew [name] ?Primary Care Leler Brion: [name] ?Other Clinician: [name] ?Referring Elliot Simoneaux: ?Treating Susan Bleich/Extender: [name] ?[name] ?Weeks in Treatment: 7 ?Vital Signs ?Time Taken: 08:10 ?Temperature (??F): 98.2 ?Height (in): 72 ?Pulse (bpm): 61 ?Weight (lbs): 260 ?Respiratory Rate (breaths/min): 18 ?Body Mass Index (BMI): 35.3 ?Blood Pressure (mmHg): 121/82 ?Capillary Blood Glucose (mg/dl): 196 ?Reference Range: 80 - 120 mg / dl ?Electronic Signature(s) ?Signed: 03/04/2022 10:07:48 AM By: [name] CHT EMT BS ?, , ?Entered By: [name] on 03/04/2022 09:38:16 ?

## 2022-03-04 NOTE — Progress Notes (Signed)
GORDEN, STTHOMAS ([id number]) ?Visit Report for 03/04/2022 ?Problem List Details ?Patient Name: Date of Service: ?[name], [name] 03/04/2022 8:00 A M ?Medical Record Number: [id number] ?Patient Account Number: [id number] ?Date of Birth/Sex: Treating RN: ?12-09-1956 (66 y.o. 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 03/04/2022 ?Medical Record Number: [id number] ?Patient Account Number: [id number] ?Date of Birth/Sex: Treating RN: ?[birth year] (66 y.o. 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 ?I10 Essential (primary) hypertension ?Facility Procedures ?CPT4 Code: 76226333 ?Description: G0277-(Facility Use Only) HBOT full body chamber, 22mn , ICD-10 Diagnosis Description N30.41 Irradiation cystitis with hematuria C61 Malignant neoplasm of prostate  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

## 2022-03-05 ENCOUNTER — Other Ambulatory Visit: Payer: Self-pay

## 2022-03-05 ENCOUNTER — Encounter (HOSPITAL_BASED_OUTPATIENT_CLINIC_OR_DEPARTMENT_OTHER): Payer: 59 | Admitting: General Surgery

## 2022-03-05 DIAGNOSIS — C61 Malignant neoplasm of prostate: Secondary | ICD-10-CM | POA: Diagnosis not present

## 2022-03-05 LAB — GLUCOSE, CAPILLARY
Glucose-Capillary: 191 mg/dL — ABNORMAL HIGH (ref 70–99)
Glucose-Capillary: 173 mg/dL — ABNORMAL HIGH (ref 70–99)

## 2022-03-05 NOTE — Progress Notes (Signed)
MIKLOS, BIDINGER ([id number]) ?Visit Report for 03/05/2022 ?Problem List Details ?Patient Name: Date of Service: ?[name], [name] 03/05/2022 8:00 A M ?Medical Record Number: [id number] ?Patient Account Number: [id number] ?Date of Birth/Sex: Treating RN: ?06-25-1956 (66 y.o. 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 03/05/2022 ?Medical Record Number: [id number] ?Patient Account Number: [id number] ?Date of Birth/Sex: Treating RN: ?02-Apr-1956 (66 y.o. 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 ?I10 Essential (primary) hypertension ?Facility Procedures ?CPT4 Code: [id number] ?Description: G0277-(Facility Use Only) HBOT full body chamber, 56mn , ICD-10 Diagnosis Description C61 Malignant  neoplasm of prostate 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 IC58 ?Essential (primary) hypertension ?Modifier: ?Quantity: 5 ?Physician Procedures ?: CPT4 Code Description Modifier [id number] 23536- WC PHYS HYPERBARIC OXYGEN THERAPY ICD-10 Diagnosis Description C61 Malignant neoplasm of prostate N30.41 Irradiation cystitis with hematuria E11.59 Type 2 diabetes mellitus with other circulatory  ?complications IR44Essential (primary) hypertension ?Quantity: 1 ?Electronic Signature(s) ?Signed: 03/05/2022 4:23:35 PM By: CFredirick MaudlinMD FACS ?Previous Signature: 03/05/2022 11:05:48 AM Version By: GValeria BatmanEMT ?Entered By: CFredirick Maudlinon 03/05/2022 16:23:34 ?

## 2022-03-05 NOTE — Progress Notes (Addendum)
Andrew, [name] ([id number]) ?Visit Report for 03/05/2022 ?HBO Details ?Patient Name: Date of Service: ?[name], [name] 03/05/2022 8:00 A M ?Medical Record Number: [id number] ?Patient Account Number: [id number] ?Date of Birth/Sex: Treating RN: ?08-29-1956 (66 y.o. Andrew [name] ?Primary Care Andrew [name]: Andrew [name] ?Other Clinician: Valeria [name] ?Referring Andrew [name]: ?Treating Andrew [name]/Extender: Andrew [name] ?Andrew [name] ?Weeks in Treatment: 7 ?HBO Treatment Course Details ?Treatment Course Number: 1 ?Ordering Andrew [name]: Andrew [name] ?T Treatments Ordered: ?otal 40 HBO Treatment Start Date: 01/20/2022 ?HBO Indication: ?Late Effect of Radiation ?HBO Treatment Details ?Treatment Number: 32 ?Patient Type: Outpatient ?Chamber Type: Monoplace ?Chamber Serial #: [id number] ?Treatment Protocol: 2.5 ATA with 90 minutes oxygen, with two 5 minute air breaks ?Treatment Details ?Compression Rate Down: 1.0 psi / minute ?De-Compression Rate Up: 1.0 psi / minute ?A breaks and breathing ?ir ?Compress Tx Pressure periods Decompress Decompress ?Begins Reached (leave unused spaces Begins Ends ?blank) ?Chamber Pressure (ATA 1 2.5 2.5 2.5 2.5 2.5 - - 2.5 1 ?) ?Clock Time (24 hr) 08:06 08:26 08:58 09:04 09:34 09:39 - - 10:09 10:33 ?Treatment Length: 147 (minutes) ?Treatment Segments: 5 ?Vital Signs ?Capillary Blood Glucose Reference Range: 80 - 120 mg / dl ?HBO Diabetic Blood Glucose Intervention Range: <131 mg/dl or >249 mg/dl ?Time Vitals Blood Respiratory Capillary Blood Glucose Pulse Action ?Type: ?Pulse: Temperature: ?Taken: ?Pressure: ?Rate: ?Glucose (mg/dl): ?Meter #: Oximetry (%) Taken: ?Pre 07:58 131/85 66 18 98.2 191 ?Post 10:35 119/85 55 16 98 173 ?Treatment Response ?Treatment Toleration: Well ?Treatment Completion Status: Treatment Completed without Adverse Event ?Treatment Notes ?Paper version used prior to the treatment. Treatment went well today. No problems with ears, or sinuses. After treatment, I  asked if he had any ear or sinus ?problems. He stated that everything went great. ?Physician HBO Attestation: ?I certify that I supervised this HBO treatment in accordance with Medicare ?guidelines. A trained emergency response team is readily available per Yes ?hospital policies and procedures. ?Continue HBOT as ordered. Yes ?Electronic Signature(s) ?Signed: 03/05/2022 4:11:59 PM By: Andrew Maudlin MD FACS ?Previous Signature: 03/05/2022 11:05:18 AM Version By: Andrew [name] EMT ?Entered By: Andrew [name] on 03/05/2022 16:11:58 ?-------------------------------------------------------------------------------- ?HBO Safety Checklist Details ?Patient Name: ?Date of Service: ?[name], [name] 03/05/2022 8:00 A M ?Medical Record Number: [id number] ?Patient Account Number: [id number] ?Date of Birth/Sex: ?Treating RN: ?1956/07/27 (66 y.o. Andrew [name] ?Primary Care Andrew [name]: Andrew [name] ?Other Clinician: Valeria [name] ?Referring Gardenia Witter: ?Treating Andrew [name]/Extender: Andrew [name] ?Andrew [name] ?Weeks in Treatment: 7 ?HBO Safety Checklist Items ?Safety Checklist ?Consent Form Signed ?Patient voided / foley secured and emptied ?When did you last eato 0630 ?Last dose of injectable or oral agent After treatment ?Ostomy pouch emptied and vented if applicable ?NA ?All implantable devices assessed, documented and approved ?NA ?Intravenous access site secured and place ?NA ?Valuables secured ?Linens and cotton and cotton/polyester blend (less than 51% polyester) ?Personal oil-based products / skin lotions / body lotions removed ?Wigs or hairpieces removed ?NA ?Smoking or tobacco materials removed ?Books / newspapers / magazines / loose paper removed ?Cologne, aftershave, perfume and deodorant removed ?Jewelry removed (may wrap wedding band) ?NA ?Make-up removed ?NA ?Hair care products removed ?Battery operated devices (external) removed ?Heating patches and chemical warmers removed ?Titanium eyewear  removed ?NA ?Nail polish cured greater than 10 hours ?NA ?Casting material cured greater than 10 hours ?NA ?Hearing aids removed ?NA ?Loose dentures or partials removed ?NA ?Prosthetics have been removed ?NA ?Patient demonstrates correct use of air break device (if applicable) ?Patient  concerns have been addressed ?Patient grounding bracelet on and cord attached to chamber ?Specifics for Inpatients (complete in addition to above) ?Medication sheet sent with patient ?NA ?Intravenous medications needed or due during therapy sent with patient ?NA ?Drainage tubes (e.g. nasogastric tube or chest tube secured and vented) ?NA ?Endotracheal or Tracheotomy tube secured ?NA ?Cuff deflated of air and inflated with saline ?Airway suctioned ?NA ?Notes ?Paper version used prior to the treatment. Before treatment began, I asked the patient if he had any problems with his ears or sinus today. He stated no. ?Electronic Signature(s) ?Signed: 03/05/2022 11:00:29 AM By: Andrew [name] EMT ?Entered By: Andrew [name] on 03/05/2022 11:00:29 ?

## 2022-03-05 NOTE — Progress Notes (Addendum)
NICHOLOUS, GIRGENTI ([id number]) ?Visit Report for 03/05/2022 ?Arrival Information Details ?Patient Name: Date of Service: ?[name], [name] 03/05/2022 8:00 A M ?Medical Record Number: [id number] ?Patient Account Number: [id number] ?Date of Birth/Sex: Treating RN: ?1956-09-08 (66 y.o. [name] ?Primary Care Min Collymore: [name] ?Other Clinician: [name] ?Referring Kevona Lupinacci: ?Treating Analeah Brame/Extender: [name] ?[name] ?Weeks in Treatment: 7 ?Visit Information History Since Last Visit ?All ordered tests and consults were completed: Yes ?Patient Arrived: Ambulatory ?Added or deleted any medications: No ?Arrival Time: 07:49 ?Any new allergies or adverse reactions: No ?Accompanied By: None ?Had a fall or experienced change in No ?Transfer Assistance: None ?activities of daily living that may affect ?Patient Identification Verified: Yes ?risk of falls: ?Patient Requires Transmission-Based Precautions: No ?Signs or symptoms of abuse/neglect since last visito No ?Patient Has Alerts: No ?Hospitalized since last visit: No ?Implantable device outside of the clinic excluding No ?cellular tissue based products placed in the center ?since last visit: ?Pain Present Now: No ?Notes ?Paper version used prior to the treatment. ?Electronic Signature(s) ?Signed: 03/05/2022 11:11:03 AM By: [name] EMT ?Previous Signature: 03/05/2022 10:54:44 AM Version By: [name] EMT ?Entered By: [name] on 03/05/2022 11:11:03 ?-------------------------------------------------------------------------------- ?Encounter Discharge Information Details ?Patient Name: Date of Service: ?[name], [name] 03/05/2022 8:00 A M ?Medical Record Number: [id number] ?Patient Account Number: [id number] ?Date of Birth/Sex: Treating RN: ?Oct 20, 1956 (66 y.o. [name] ?Primary Care Almadelia Looman: [name] ?Other Clinician: [name] ?Referring Shawndra Clute: ?Treating Cerrone Debold/Extender: [name] ?[name] ?Weeks in Treatment: 7 ?Encounter Discharge Information Items ?Discharge Condition: Stable ?Ambulatory Status: Ambulatory ?Discharge Destination: Home ?Transportation: Private Auto ?Accompanied By: None ?Schedule Follow-up Appointment: Yes ?Clinical Summary of Care: ?Electronic Signature(s) ?Signed: 03/05/2022 11:06:39 AM By: [name] EMT ?Entered By: [name] on 03/05/2022 11:06:38 ?-------------------------------------------------------------------------------- ?Patient/Caregiver Education Details ?Patient Name: ?Date of Service: ?[name], [name] 3/15/2023andnbsp8:00 A M ?Medical Record Number: [id number] ?Patient Account Number: [id number] ?Date of Birth/Gender: ?Treating RN: ?1956/09/24 (66 y.o. [name] ?Primary Care Physician: [name] ?Other Clinician: [name] ?Referring Physician: ?Treating Physician/Extender: [name] ?[name] ?Weeks in Treatment: 7 ?Education Assessment ?Education Provided To: ?Patient ?Education Topics Provided ?Electronic Signature(s) ?Signed: 03/05/2022 3:18:13 PM By: [name] EMT ?Entered By: [name] on 03/05/2022 11:06:18 ?-------------------------------------------------------------------------------- ?Vitals Details ?Patient Name: ?Date of Service: ?[name], [name] 03/05/2022 8:00 A M ?Medical Record Number: [id number] ?Patient Account Number: [id number] ?Date of Birth/Sex: ?Treating RN: ?[birth year] (66 y.o. [name] ?Primary Care Shelba Susi: [name] ?Other Clinician: [name] ?Referring Da Michelle: ?Treating London Tarnowski/Extender: [name] ?[name] ?Weeks in Treatment: 7 ?Vital Signs ?Time Taken: 07:58 ?Temperature (??F): 98.2 ?Height (in): 72 ?Pulse (bpm): 66 ?Weight (lbs): 260 ?Respiratory Rate (breaths/min): 18 ?Body Mass Index (BMI): 35.3 ?Blood Pressure (mmHg): 131/85 ?Capillary Blood Glucose (mg/dl): 191 ?Reference Range: 80 - 120 mg /  dl ?Notes ?Paper version used prior to the treatment. ?Electronic Signature(s) ?Signed: 03/05/2022 10:55:33 AM By: [name] EMT ?Entered By: [name] on 03/05/2022 10:55:33 ?

## 2022-03-06 ENCOUNTER — Encounter (HOSPITAL_BASED_OUTPATIENT_CLINIC_OR_DEPARTMENT_OTHER): Payer: 59 | Admitting: General Surgery

## 2022-03-06 DIAGNOSIS — C61 Malignant neoplasm of prostate: Secondary | ICD-10-CM | POA: Diagnosis not present

## 2022-03-06 LAB — GLUCOSE, CAPILLARY
Glucose-Capillary: 178 mg/dL — ABNORMAL HIGH (ref 70–99)
Glucose-Capillary: 176 mg/dL — ABNORMAL HIGH (ref 70–99)

## 2022-03-06 NOTE — Progress Notes (Signed)
Andrew [name], Andrew [name] ([id number]) ?Visit Report for 03/06/2022 ?Problem List Details ?Patient Name: Date of Service: ?[name], [name] 03/06/2022 8:00 A M ?Medical Record Number: [id number] ?Patient Account Number: [id number] ?Date of Birth/Sex: Treating RN: ?1956-02-04 (66 y.o. 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 03/06/2022 ?Medical Record Number: [id number] ?Patient Account Number: [id number] ?Date of Birth/Sex: Treating RN: ?1956-06-18 (66 y.o. 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 ?I10 Essential (primary) hypertension ?Facility Procedures ?CPT4 Code: 25956387 ?Description: G0277-(Facility Use Only) HBOT full body chamber, 40mn , ICD-10 Diagnosis Description C61 Malignant  neoplasm of prostate 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 IF64 ?Essential (primary) hypertension ?Modifier: ?Quantity: 5 ?Physician Procedures ?: CPT4 Code Description Modifier [id number] PHYS HYPERBARIC OXYGEN THERAPY ICD-10 Diagnosis Description C61 Malignant neoplasm of prostate N30.41 Irradiation cystitis with hematuria E11.59 Type 2 diabetes mellitus with other circulatory  ?complications IA63Essential (primary) hypertension ?Quantity: 1 ?Electronic Signature(s) ?Signed: 03/06/2022 4:31:43 PM By: CFredirick MaudlinMD FACS ?Previous Signature: 03/06/2022 10:52:27 AM Version By: GValeria BatmanEMT ?Entered By: CFredirick Maudlinon 03/06/2022 16:31:43 ?

## 2022-03-06 NOTE — Progress Notes (Addendum)
BARTOLO, MONTANYE ([id number]) ?Visit Report for 03/06/2022 ?Arrival Information Details ?Patient Name: Date of Service: ?[name], [name] 03/06/2022 8:00 A M ?Medical Record Number: [id number] ?Patient Account Number: [id number] ?Date of Birth/Sex: Treating RN: ?1956/03/07 (66 y.o. [name] ?Primary Care Davey Limas: [name] ?Other Clinician: [name] ?Referring Lithzy Bernard: ?Treating Shanie Mauzy/Extender: [name] ?[name] ?Weeks in Treatment: 7 ?Visit Information History Since Last Visit ?All ordered tests and consults were completed: Yes ?Patient Arrived: Ambulatory ?Added or deleted any medications: No ?Arrival Time: 07:48 ?Any new allergies or adverse reactions: No ?Accompanied By: None ?Had a fall or experienced change in No ?Transfer Assistance: None ?activities of daily living that may affect ?Patient Identification Verified: Yes ?risk of falls: ?Secondary Verification Process Completed: Yes ?Signs or symptoms of abuse/neglect since last visito No ?Patient Requires Transmission-Based Precautions: No ?Hospitalized since last visit: No ?Patient Has Alerts: No ?Implantable device outside of the clinic excluding No ?cellular tissue based products placed in the center ?since last visit: ?Pain Present Now: No ?Electronic Signature(s) ?Signed: 03/06/2022 8:10:56 AM By: [name] EMT ?Entered By: [name] on 03/06/2022 08:10:56 ?-------------------------------------------------------------------------------- ?Encounter Discharge Information Details ?Patient Name: Date of Service: ?[name], [name] 03/06/2022 8:00 A M ?Medical Record Number: [id number] ?Patient Account Number: [id number] ?Date of Birth/Sex: Treating RN: ?Jun 16, 1956 (66 y.o. [name] ?Primary Care Shivonne Schwartzman: [name] ?Other Clinician: [name] ?Referring Stacye Noori: ?Treating Chip Canepa/Extender: [name] ?[name] ?Weeks in Treatment: 7 ?Encounter Discharge Information  Items ?Discharge Condition: Stable ?Ambulatory Status: Ambulatory ?Discharge Destination: Home ?Transportation: Private Auto ?Accompanied By: None ?Schedule Follow-up Appointment: Yes ?Clinical Summary of Care: ?Electronic Signature(s) ?Signed: 03/06/2022 10:53:18 AM By: [name] EMT ?Entered By: [name] on 03/06/2022 10:53:17 ?-------------------------------------------------------------------------------- ?Vitals Details ?Patient Name: ?Date of Service: ?[name], [name] 03/06/2022 8:00 A M ?Medical Record Number: [id number] ?Patient Account Number: [id number] ?Date of Birth/Sex: ?Treating RN: ?06/30/1956 (66 y.o. [name] ?Primary Care Hasina Kreager: [name] ?Other Clinician: [name] ?Referring Lafaye Mcelmurry: ?Treating Nyeemah Jennette/Extender: [name] ?[name] ?Weeks in Treatment: 7 ?Vital Signs ?Time Taken: 08:01 ?Temperature (??F): 97.8 ?Height (in): 72 ?Pulse (bpm): 62 ?Weight (lbs): 260 ?Respiratory Rate (breaths/min): 16 ?Body Mass Index (BMI): 35.3 ?Blood Pressure (mmHg): 144/75 ?Capillary Blood Glucose (mg/dl): 178 ?Reference Range: 80 - 120 mg / dl ?Electronic Signature(s) ?Signed: 03/06/2022 8:12:11 AM By: [name] EMT ?Entered By: [name] on 03/06/2022 08:12:10 ?

## 2022-03-06 NOTE — Progress Notes (Addendum)
Andrew [name], Andrew [name] ([id number]) ?Visit Report for 03/06/2022 ?HBO Details ?Patient Name: Date of Service: ?Andrew Estimable, [name] 03/06/2022 8:00 A M ?Medical Record Number: [id number] ?Patient Account Number: [id number] ?Date of Birth/Sex: Treating RN: ?[birth year] (66 y.o. Andrew [name] ?Primary Care Zarin Knupp: Andrew [name] ?Other Clinician: [name] ?Referring Rinoa Garramone: ?Treating Sladen Plancarte/Extender: [name] ?Andrew [name] ?Weeks in Treatment: 7 ?HBO Treatment Course Details ?Treatment Course Number: 1 ?Ordering Azriella Mattia: [name] ?T Treatments Ordered: ?otal 40 HBO Treatment Start Date: 01/20/2022 ?HBO Indication: ?Late Effect of Radiation ?HBO Treatment Details ?Treatment Number: 78 ?Patient Type: Outpatient ?Chamber Type: Monoplace ?Chamber Serial #: [id number] ?Treatment Protocol: 2.5 ATA with 90 minutes oxygen, with two 5 minute air breaks ?Treatment Details ?Compression Rate Down: 1.0 psi / minute ?De-Compression Rate Up: 1.0 psi / minute ?A breaks and breathing ?ir ?Compress Tx Pressure periods Decompress Decompress ?Begins Reached (leave unused spaces Begins Ends ?blank) ?Chamber Pressure (ATA 1 2.5 2.5 2.5 2.5 2.5 - - 2.5 1 ?) ?Clock Time (24 hr) 08:05 08:27 08:57 09:02 09:33 09:38 - - 10:08 10:34 ?Treatment Length: 149 (minutes) ?Treatment Segments: 5 ?Vital Signs ?Capillary Blood Glucose Reference Range: 80 - 120 mg / dl ?HBO Diabetic Blood Glucose Intervention Range: <131 mg/dl or >249 mg/dl ?Time Vitals Blood Respiratory Capillary Blood Glucose Pulse Action ?Type: ?Pulse: Temperature: ?Taken: ?Pressure: ?Rate: ?Glucose (mg/dl): ?Meter #: Oximetry (%) Taken: ?Pre 08:01 144/75 62 16 97.8 178 ?Post 10:44 121/80 57 16 98.1 176 ?Treatment Response ?Treatment Toleration: Well ?Treatment Completion Status: Treatment Completed without Adverse Event ?Treatment Notes ?Treatment went well today. No problems with ears, or sinuses. After treatment, I asked if he had any ear or sinus problems. He  stated that everything went ?great. ?Physician HBO Attestation: ?I certify that I supervised this HBO treatment in accordance with Medicare ?guidelines. A trained emergency response team is readily available per Yes ?hospital policies and procedures. ?Continue HBOT as ordered. Yes ?Electronic Signature(s) ?Signed: 03/06/2022 4:31:33 PM By: [name] ?Previous Signature: 03/06/2022 10:49:06 AM Version By: [name] EMT ?Entered By: [name] on 03/06/2022 16:31:33 ?-------------------------------------------------------------------------------- ?HBO Safety Checklist Details ?Patient Name: ?Date of Service: ?Andrew Estimable, [name] 03/06/2022 8:00 A M ?Medical Record Number: [id number] ?Patient Account Number: [id number] ?Date of Birth/Sex: ?Treating RN: ?Nov 25, 1956 (66 y.o. Andrew [name] ?Primary Care Javeah Loeza: Andrew [name] ?Other Clinician: [name] ?Referring Andrew [name]: ?Treating Melanie Openshaw/Extender: [name] ?Andrew [name] ?Weeks in Treatment: 7 ?HBO Safety Checklist Items ?Safety Checklist ?Consent Form Signed ?Patient voided / foley secured and emptied ?When did you last eato 0630 ?Last dose of injectable or oral agent After treatment ?Ostomy pouch emptied and vented if applicable ?NA ?All implantable devices assessed, documented and approved ?NA ?Intravenous access site secured and place ?NA ?Valuables secured ?Linens and cotton and cotton/polyester blend (less than 51% polyester) ?Personal oil-based products / skin lotions / body lotions removed ?Wigs or hairpieces removed ?NA ?Smoking or tobacco materials removed ?Books / newspapers / magazines / loose paper removed ?Cologne, aftershave, perfume and deodorant removed ?Jewelry removed (may wrap wedding band) ?NA ?Make-up removed ?NA ?Hair care products removed ?Battery operated devices (external) removed ?Heating patches and chemical warmers removed ?Titanium eyewear removed ?NA ?Nail polish cured greater than 10  hours ?NA ?Casting material cured greater than 10 hours ?NA ?Hearing aids removed ?NA ?Loose dentures or partials removed ?NA ?Prosthetics have been removed ?NA ?Patient demonstrates correct use of air break device (if applicable) ?Patient concerns have been addressed ?Patient grounding bracelet  on and cord attached to chamber ?Specifics for Inpatients (complete in addition to above) ?Medication sheet sent with patient ?NA ?Intravenous medications needed or due during therapy sent with patient ?NA ?Drainage tubes (e.g. nasogastric tube or chest tube secured and vented) ?NA ?Endotracheal or Tracheotomy tube secured ?NA ?Cuff deflated of air and inflated with saline ?NA ?Airway suctioned ?NA ?Notes ?Before treatment began, I asked the patient if he had any problems with his ears or sinus today. He stated no. ?Electronic Signature(s) ?Signed: 03/06/2022 8:13:46 AM By: [name] EMT ?Entered By: [name] on 03/06/2022 08:13:46 ?

## 2022-03-07 ENCOUNTER — Encounter (HOSPITAL_BASED_OUTPATIENT_CLINIC_OR_DEPARTMENT_OTHER): Payer: 59 | Admitting: General Surgery

## 2022-03-07 ENCOUNTER — Other Ambulatory Visit: Payer: Self-pay

## 2022-03-07 DIAGNOSIS — C61 Malignant neoplasm of prostate: Secondary | ICD-10-CM | POA: Diagnosis not present

## 2022-03-07 LAB — GLUCOSE, CAPILLARY
Glucose-Capillary: 204 mg/dL — ABNORMAL HIGH (ref 70–99)
Glucose-Capillary: 176 mg/dL — ABNORMAL HIGH (ref 70–99)

## 2022-03-07 NOTE — Progress Notes (Addendum)
VANE, YAPP ([id number]) ?Visit Report for 03/07/2022 ?HBO Details ?Patient Name: Date of Service: ?Andrew Estimable, [name] 03/07/2022 8:00 A M ?Medical Record Number: [id number] ?Patient Account Number: [id number] ?Date of Birth/Sex: Treating RN: ?[birth year] (66 y.o. Andrew [name] ?Primary Care Reniya Mcclees: [name] ?Other Clinician: [name] ?Referring Quina Wilbourne: ?Treating Alina Gilkey/Extender: [name] ?[name] ?Weeks in Treatment: 7 ?HBO Treatment Course Details ?Treatment Course Number: 1 ?Ordering Conn Trombetta: [name] ?T Treatments Ordered: ?otal 40 HBO Treatment Start Date: 01/20/2022 ?HBO Indication: ?Late Effect of Radiation ?HBO Treatment Details ?Treatment Number: 62 ?Patient Type: Outpatient ?Chamber Type: Monoplace ?Chamber Serial #: [id number] ?Treatment Protocol: 2.5 ATA with 90 minutes oxygen, with two 5 minute air breaks ?Treatment Details ?Compression Rate Down: 1.0 psi / minute ?De-Compression Rate Up: 1.0 psi / minute ?A breaks and breathing ?ir ?Compress Tx Pressure periods Decompress Decompress ?Begins Reached (leave unused spaces Begins Ends ?blank) ?Chamber Pressure (ATA 1 2.5 2.5 2.5 2.5 2.5 - - 2.5 1 ?) ?Clock Time (24 hr) 08:10 08:35 09:05 09:10 09:40 09:45 - - 10:15 10:42 ?Treatment Length: 152 (minutes) ?Treatment Segments: 5 ?Vital Signs ?Capillary Blood Glucose Reference Range: 80 - 120 mg / dl ?HBO Diabetic Blood Glucose Intervention Range: <131 mg/dl or >249 mg/dl ?Time Vitals Blood Respiratory Capillary Blood Glucose Pulse Action ?Type: ?Pulse: Temperature: ?Taken: ?Pressure: ?Rate: ?Glucose (mg/dl): ?Meter #: Oximetry (%) Taken: ?Pre 08:03 125/83 71 18 98.2 204 ?Post 10:45 122/84 61 18 98.3 176 ?Treatment Response ?Treatment Toleration: Well ?Treatment Completion Status: Treatment Completed without Adverse Event ?Treatment Notes ?Treatment went well today. No problems with ears, or sinuses. After treatment, I asked if he had any ear or sinus problems. He  stated that everything went ?well today. ?Physician HBO Attestation: ?I certify that I supervised this HBO treatment in accordance with Medicare ?guidelines. A trained emergency response team is readily available per Yes ?hospital policies and procedures. ?Continue HBOT as ordered. Yes ?Electronic Signature(s) ?Signed: 03/07/2022 12:20:23 PM By: [name] ?Previous Signature: 03/07/2022 11:20:22 AM Version By: [name] EMT ?Entered By: [name] on 03/07/2022 12:20:23 ?-------------------------------------------------------------------------------- ?HBO Safety Checklist Details ?Patient Name: ?Date of Service: ?Andrew Estimable, [name] 03/07/2022 8:00 A M ?Medical Record Number: [id number] ?Patient Account Number: [id number] ?Date of Birth/Sex: ?Treating RN: ?08/30/1956 (66 y.o. Andrew [name] ?Primary Care Jaevian Shean: [name] ?Other Clinician: [name] ?Referring Gorge Almanza: ?Treating Aliviah Spain/Extender: [name] ?[name] ?Weeks in Treatment: 7 ?HBO Safety Checklist Items ?Safety Checklist ?Consent Form Signed ?Patient voided / foley secured and emptied ?When did you last eato 0630 ?Last dose of injectable or oral agent After treatment ?Ostomy pouch emptied and vented if applicable ?NA ?All implantable devices assessed, documented and approved ?NA ?Intravenous access site secured and place ?NA ?Valuables secured ?Linens and cotton and cotton/polyester blend (less than 51% polyester) ?Personal oil-based products / skin lotions / body lotions removed ?Wigs or hairpieces removed ?NA ?Smoking or tobacco materials removed ?Books / newspapers / magazines / loose paper removed ?Cologne, aftershave, perfume and deodorant removed ?Jewelry removed (may wrap wedding band) ?NA ?Make-up removed ?NA ?Hair care products removed ?Battery operated devices (external) removed ?Heating patches and chemical warmers removed ?Titanium eyewear removed ?NA ?Nail polish cured greater than  10 hours ?NA ?Casting material cured greater than 10 hours ?NA ?Hearing aids removed ?NA ?Loose dentures or partials removed ?NA ?Prosthetics have been removed ?NA ?Patient demonstrates correct use of air break device (if applicable) ?Patient concerns have been addressed ?Patient grounding  bracelet on and cord attached to chamber ?Specifics for Inpatients (complete in addition to above) ?Medication sheet sent with patient ?NA ?Intravenous medications needed or due during therapy sent with patient ?NA ?Drainage tubes (e.g. nasogastric tube or chest tube secured and vented) ?NA ?Endotracheal or Tracheotomy tube secured ?NA ?Cuff deflated of air and inflated with saline ?NA ?Airway suctioned ?NA ?Electronic Signature(s) ?Signed: 03/07/2022 8:16:57 AM By: [name] EMT ?Entered By: [name] on 03/07/2022 08:16:56 ?

## 2022-03-07 NOTE — Progress Notes (Addendum)
Andrew [name], DUESING ([id number]) ?Visit Report for 03/07/2022 ?Arrival Information Details ?Patient Name: Date of Service: ?Andrew Estimable, [name] 03/07/2022 8:00 A M ?Medical Record Number: [id number] ?Patient Account Number: [id number] ?Date of Birth/Sex: Treating RN: ?22-Apr-1956 (66 y.o. Andrew [name] ?Primary Care Ertha Nabor: [name] ?Other Clinician: [name] ?Referring Evani Shrider: ?Treating Dereka Lueras/Extender: [name] ?[name] ?Weeks in Treatment: 7 ?Visit Information History Since Last Visit ?All ordered tests and consults were completed: Yes ?Patient Arrived: Ambulatory ?Added or deleted any medications: No ?Arrival Time: 07:53 ?Any new allergies or adverse reactions: No ?Accompanied By: None ?Had a fall or experienced change in No ?Transfer Assistance: None ?activities of daily living that may affect ?Patient Identification Verified: Yes ?risk of falls: ?Secondary Verification Process Completed: Yes ?Signs or symptoms of abuse/neglect since last visito No ?Patient Requires Transmission-Based Precautions: No ?Hospitalized since last visit: No ?Patient Has Alerts: No ?Implantable device outside of the clinic excluding No ?cellular tissue based products placed in the center ?since last visit: ?Pain Present Now: No ?Electronic Signature(s) ?Signed: 03/07/2022 8:14:13 AM By: [name] EMT ?Entered By: [name] on 03/07/2022 08:14:12 ?-------------------------------------------------------------------------------- ?Encounter Discharge Information Details ?Patient Name: Date of Service: ?Andrew Estimable, [name] 03/07/2022 8:00 A M ?Medical Record Number: [id number] ?Patient Account Number: [id number] ?Date of Birth/Sex: Treating RN: ?1956-02-05 (66 y.o. Andrew [name] ?Primary Care Tris Howell: [name] ?Other Clinician: [name] ?Referring Coal Nearhood: ?Treating Carrington Olazabal/Extender: [name] ?[name] ?Weeks in Treatment: 7 ?Encounter Discharge Information  Items ?Discharge Condition: Stable ?Ambulatory Status: Ambulatory ?Discharge Destination: Home ?Transportation: Private Auto ?Accompanied By: None ?Schedule Follow-up Appointment: Yes ?Clinical Summary of Care: ?Electronic Signature(s) ?Signed: 03/07/2022 11:21:30 AM By: [name] EMT ?Entered By: [name] on 03/07/2022 11:21:29 ?-------------------------------------------------------------------------------- ?Vitals Details ?Patient Name: ?Date of Service: ?Andrew Estimable, [name] 03/07/2022 8:00 A M ?Medical Record Number: [id number] ?Patient Account Number: [id number] ?Date of Birth/Sex: ?Treating RN: ?1956-02-05 (66 y.o. Andrew [name] ?Primary Care [name]: [name] ?Other Clinician: [name] ?Referring Carmel Garfield: ?Treating Bodhi Moradi/Extender: [name] ?[name] ?Weeks in Treatment: 7 ?Vital Signs ?Time Taken: 08:03 ?Temperature (??F): 98.2 ?Height (in): 72 ?Pulse (bpm): 71 ?Weight (lbs): 260 ?Respiratory Rate (breaths/min): 18 ?Body Mass Index (BMI): 35.3 ?Blood Pressure (mmHg): 125/83 ?Capillary Blood Glucose (mg/dl): 204 ?Reference Range: 80 - 120 mg / dl ?Notes ?I asked the patient if he had any problems today with his ears or sinus. He stated no. ?Electronic Signature(s) ?Signed: 03/07/2022 8:16:01 AM By: [name] EMT ?Entered By: [name] on 03/07/2022 08:16:01 ?

## 2022-03-07 NOTE — Progress Notes (Signed)
PRYCE, FOLTS ([id number]) ?Visit Report for 03/07/2022 ?Problem List Details ?Patient Name: Date of Service: ?[name], [name] 03/07/2022 8:00 A M ?Medical Record Number: [id number] ?Patient Account Number: [id number] ?Date of Birth/Sex: Treating RN: ?1956-11-14 (66 y.o. 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 03/07/2022 ?Medical Record Number: [id number] ?Patient Account Number: [id number] ?Date of Birth/Sex: Treating RN: ?1956-06-23 (66 y.o. 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 ?I10 Essential (primary) hypertension ?Facility Procedures ?CPT4 Code: 40973532 ?Description: G0277-(Facility Use Only) HBOT full body chamber, 90mn , ICD-10 Diagnosis Description C61 Malignant  neoplasm of prostate 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 ID92 ?Essential (primary) hypertension ?Modifier: ?Quantity: 5 ?Physician Procedures ?: CPT4 Code Description Modifier [id number]- [id number] PHYS HYPERBARIC OXYGEN THERAPY ICD-10 Diagnosis Description C61 Malignant neoplasm of prostate N30.41 Irradiation cystitis with hematuria E11.59 Type 2 diabetes mellitus with other circulatory  ?complications IL79Essential (primary) hypertension ?Quantity: 1 ?Electronic Signature(s) ?Signed: 03/07/2022 12:20:37 PM By: CFredirick MaudlinMD FACS ?Previous Signature: 03/07/2022 11:20:49 AM Version By: GValeria BatmanEMT ?Entered By: CFredirick Maudlinon 03/07/2022 12:20:37 ?

## 2022-03-10 ENCOUNTER — Encounter (HOSPITAL_BASED_OUTPATIENT_CLINIC_OR_DEPARTMENT_OTHER): Payer: 59 | Admitting: Internal Medicine

## 2022-03-10 ENCOUNTER — Other Ambulatory Visit: Payer: Self-pay

## 2022-03-10 DIAGNOSIS — I10 Essential (primary) hypertension: Secondary | ICD-10-CM | POA: Diagnosis not present

## 2022-03-10 DIAGNOSIS — C61 Malignant neoplasm of prostate: Secondary | ICD-10-CM | POA: Diagnosis not present

## 2022-03-10 DIAGNOSIS — N3041 Irradiation cystitis with hematuria: Secondary | ICD-10-CM | POA: Diagnosis not present

## 2022-03-10 DIAGNOSIS — E1159 Type 2 diabetes mellitus with other circulatory complications: Secondary | ICD-10-CM | POA: Diagnosis not present

## 2022-03-10 LAB — GLUCOSE, CAPILLARY
Glucose-Capillary: 181 mg/dL — ABNORMAL HIGH (ref 70–99)
Glucose-Capillary: 173 mg/dL — ABNORMAL HIGH (ref 70–99)

## 2022-03-10 NOTE — Progress Notes (Addendum)
MARCELLA, DUNNAWAY ([id number]) ?Visit Report for 03/10/2022 ?Arrival Information Details ?Patient Name: Date of Service: ?[name], [name] 03/10/2022 8:00 A M ?Medical Record Number: [id number] ?Patient Account Number: [id number] ?Date of Birth/Sex: Treating RN: ?[birth year] (66 y.o. [name] ?Primary Care Shaleen Talamantez: [name] ?Other Clinician: [name] ?Referring Dorinne Graeff: ?Treating Intisar Claudio/Extender: [name] ?[name] ?Weeks in Treatment: 8 ?Visit Information History Since Last Visit ?All ordered tests and consults were completed: Yes ?Patient Arrived: Ambulatory ?Added or deleted any medications: No ?Arrival Time: 07:47 ?Any new allergies or adverse reactions: No ?Accompanied By: None ?Had a fall or experienced change in No ?Transfer Assistance: None ?activities of daily living that may affect ?Patient Identification Verified: Yes ?risk of falls: ?Secondary Verification Process Completed: Yes ?Signs or symptoms of abuse/neglect since last visito No ?Patient Requires Transmission-Based Precautions: No ?Hospitalized since last visit: No ?Patient Has Alerts: No ?Implantable device outside of the clinic excluding No ?cellular tissue based products placed in the center ?since last visit: ?Pain Present Now: No ?Notes ?I asked the patient if he had any problems with his ear or sinus today. He stated no. ?Electronic Signature(s) ?Signed: 03/10/2022 8:24:49 AM By: [name] EMT ?Entered By: [name] on 03/10/2022 08:24:49 ?-------------------------------------------------------------------------------- ?Encounter Discharge Information Details ?Patient Name: Date of Service: ?[name], [name] 03/10/2022 8:00 A M ?Medical Record Number: [id number] ?Patient Account Number: [id number] ?Date of Birth/Sex: Treating RN: ?[birth year] (66 y.o. [name] ?Primary Care Steele Stracener: [name] ?Other Clinician: [name] ?Referring Kenley Rettinger: ?Treating Alisi Lupien/Extender:  [name] ?[name] ?Weeks in Treatment: 8 ?Encounter Discharge Information Items ?Discharge Condition: Stable ?Ambulatory Status: Ambulatory ?Discharge Destination: Home ?Transportation: Private Auto ?Accompanied By: None ?Schedule Follow-up Appointment: Yes ?Clinical Summary of Care: ?Electronic Signature(s) ?Signed: 03/10/2022 1:09:29 PM By: [name] EMT ?Entered By: [name] on 03/10/2022 13:09:28 ?-------------------------------------------------------------------------------- ?Vitals Details ?Patient Name: ?Date of Service: ?[name], [name] 03/10/2022 8:00 A M ?Medical Record Number: [id number] ?Patient Account Number: [id number] ?Date of Birth/Sex: ?Treating RN: ?December 22, 1956 (66 y.o. [name] ?Primary Care Justise Ehmann: [name] ?Other Clinician: [name] ?Referring Quaran Kedzierski: ?Treating Tiandra Swoveland/Extender: [name] ?[name] ?Weeks in Treatment: 8 ?Vital Signs ?Time Taken: 08:01 ?Temperature (??F): 98.1 ?Height (in): 72 ?Pulse (bpm): 79 ?Weight (lbs): 260 ?Respiratory Rate (breaths/min): 16 ?Body Mass Index (BMI): 35.3 ?Blood Pressure (mmHg): 123/84 ?Capillary Blood Glucose (mg/dl): 181 ?Reference Range: 80 - 120 mg / dl ?Electronic Signature(s) ?Signed: 03/10/2022 8:26:07 AM By: [name] EMT ?Entered By: [name] on 03/10/2022 08:26:07 ?

## 2022-03-10 NOTE — Progress Notes (Addendum)
JEWELL, Andrew [name] ([id number]) ?Visit Report for 03/10/2022 ?HBO Details ?Patient Name: Date of Service: ?[name], [name] 03/10/2022 8:00 A M ?Medical Record Number: [id number] ?Patient Account Number: [id number] ?Date of Birth/Sex: Treating RN: ?01/23/1956 (66 y.o. [name] ?Primary Care Yovanna Cogan: [name] ?Other Clinician: [name] ?Referring Lyanna Blystone: ?Treating Monta Maiorana/Extender: [name] ?[name] ?Weeks in Treatment: 8 ?HBO Treatment Course Details ?Treatment Course Number: 1 ?Ordering Akeelah Seppala: [name] ?T Treatments Ordered: ?otal 40 HBO Treatment Start Date: 01/20/2022 ?HBO Indication: ?Late Effect of Radiation ?HBO Treatment Details ?Treatment Number: 35 ?Patient Type: Outpatient ?Chamber Type: Monoplace ?Chamber Serial #: [id number] ?Treatment Protocol: 2.5 ATA with 90 minutes oxygen, with two 5 minute air breaks ?Treatment Details ?Compression Rate Down: 1.0 psi / minute De-Compression Rate Up: ?A breaks and breathing ?ir ?Compress Tx Pressure periods Decompress Decompress ?Begins Reached (leave unused spaces Begins Ends ?blank) ?Chamber Pressure (ATA 1 2.5 2.5 2.5 2.5 2.5 - - 2.5 1 ?) ?Clock Time (24 hr) 08:09 08:32 09:02 09:07 09:37 09:42 - - 10:12 10:37 ?Treatment Length: 148 (minutes) ?Treatment Segments: 5 ?Vital Signs ?Capillary Blood Glucose Reference Range: 80 - 120 mg / dl ?HBO Diabetic Blood Glucose Intervention Range: <131 mg/dl or >249 mg/dl ?Time Vitals Blood Respiratory Capillary Blood Glucose Pulse Action ?Type: ?Pulse: Temperature: ?Taken: ?Pressure: ?Rate: ?Glucose (mg/dl): ?Meter #: Oximetry (%) Taken: ?Pre 08:01 123/84 79 16 98.1 181 ?Post 12:07 115/76 62 16 98.1 173 ?Treatment Response ?Treatment Toleration: Well ?Treatment Completion Status: Treatment Completed without Adverse Event ?Treatment Notes ?Treatment went well today. No problems with ears, or sinuses. After treatment, I asked if he had any ear or sinus problems. He stated that  everything is good. ?Physician HBO Attestation: ?I certify that I supervised this HBO treatment in accordance with Medicare ?guidelines. A trained emergency response team is readily available per Yes ?hospital policies and procedures. ?Continue HBOT as ordered. Yes ?Electronic Signature(s) ?Signed: 03/10/2022 2:50:28 PM By: [name] DO ?Previous Signature: 03/10/2022 1:05:50 PM Version By: [name] EMT ?Previous Signature: 03/10/2022 8:35:14 AM Version By: [name] EMT ?Entered By: [name] on 03/10/2022 14:48:49 ?-------------------------------------------------------------------------------- ?HBO Safety Checklist Details ?Patient Name: ?Date of Service: ?[name], [name] 03/10/2022 8:00 A M ?Medical Record Number: [id number] ?Patient Account Number: [id number] ?Date of Birth/Sex: ?Treating RN: ?1956-07-27 (66 y.o. [name] ?Primary Care Evadene Wardrip: [name] ?Other Clinician: [name] ?Referring Earlean Fidalgo: ?Treating Lavinia Mcneely/Extender: [name] ?[name] ?Weeks in Treatment: 8 ?HBO Safety Checklist Items ?Safety Checklist ?Consent Form Signed ?Patient voided / foley secured and emptied ?When did you last eato 0630 ?Last dose of injectable or oral agent After treatment ?Ostomy pouch emptied and vented if applicable ?NA ?All implantable devices assessed, documented and approved ?NA ?Intravenous access site secured and place ?NA ?Valuables secured ?Linens and cotton and cotton/polyester blend (less than 51% polyester) ?Personal oil-based products / skin lotions / body lotions removed ?Wigs or hairpieces removed ?NA ?Smoking or tobacco materials removed ?Books / newspapers / magazines / loose paper removed ?Cologne, aftershave, perfume and deodorant removed ?Jewelry removed (may wrap wedding band) ?NA ?Make-up removed ?NA ?Hair care products removed ?Battery operated devices (external) removed ?Heating patches and chemical warmers removed ?Titanium eyewear  removed ?NA ?Nail polish cured greater than 10 hours ?NA ?Casting material cured greater than 10 hours ?NA ?Hearing aids removed ?NA ?Loose dentures or partials removed ?NA ?Prosthetics have been removed ?NA ?Patient demonstrates correct use of air break device (if applicable) ?Patient concerns have  been addressed ?Patient grounding bracelet on and cord attached to chamber ?Specifics for Inpatients (complete in addition to above) ?Medication sheet sent with patient ?NA ?Intravenous medications needed or due during therapy sent with patient ?NA ?Drainage tubes (e.g. nasogastric tube or chest tube secured and vented) ?NA ?Endotracheal or Tracheotomy tube secured ?NA ?Cuff deflated of air and inflated with saline ?NA ?Airway suctioned ?NA ?Electronic Signature(s) ?Signed: 03/10/2022 8:27:36 AM By: [name] EMT ?Entered By: [name] on 03/10/2022 08:27:35 ?

## 2022-03-10 NOTE — Progress Notes (Addendum)
Andrew [name], Andrew [name] ([id number]) ?Visit Report for 03/10/2022 ?Problem List Details ?Patient Name: Date of Service: ?[name], [name] 03/10/2022 8:00 A M ?Medical Record Number: [id number] ?Patient Account Number: [id number] ?Date of Birth/Sex: Treating RN: ?March 13, 1956 (66 y.o. 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 03/10/2022 ?Medical Record Number: [id number] ?Patient Account Number: [id number] ?Date of Birth/Sex: Treating RN: ?1956/11/09 (66 y.o. 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 ?I10 Essential (primary) hypertension ?Facility Procedures ?CPT4 Code: 50932671 ?Description: G0277-(Facility Use Only) HBOT full body chamber, 36mn , ICD-10 Diagnosis Description N30.41 Irradiation cystitis with hematuria  C61 Malignant neoplasm of prostate 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 II45 ?Essential (primary) hypertension ?Modifier: ?Quantity: 5 ?Physician Procedures ?: CPT4 Code Description Modifier [id number]- WC PHYS HYPERBARIC OXYGEN THERAPY ICD-10 Diagnosis Description N30.41 Irradiation cystitis with hematuria C61 Malignant neoplasm of prostate E11.59 Type 2 diabetes mellitus with other circulatory  ?complications IL97Essential (primary) hypertension ?Quantity: 1 ?Electronic Signature(s) ?Signed: 03/18/2022 3:48:19 PM By: GValeria BatmanEMT ?Signed: 03/24/2022 9:02:45 AM By: HKalman ShanDO ?Previous Signature: 03/10/2022 1:07:11 PM Version By: GValeria BatmanEMT ?Previous Signature: 03/10/2022 2:50:28 PM Version By: HKalman ShanDO ?Entered By: GValeria Batmanon 03/18/2022 15:48:18 ?

## 2022-03-11 ENCOUNTER — Encounter (HOSPITAL_BASED_OUTPATIENT_CLINIC_OR_DEPARTMENT_OTHER): Payer: 59 | Admitting: Internal Medicine

## 2022-03-11 DIAGNOSIS — N3041 Irradiation cystitis with hematuria: Secondary | ICD-10-CM | POA: Diagnosis not present

## 2022-03-11 DIAGNOSIS — I10 Essential (primary) hypertension: Secondary | ICD-10-CM | POA: Diagnosis not present

## 2022-03-11 DIAGNOSIS — E1159 Type 2 diabetes mellitus with other circulatory complications: Secondary | ICD-10-CM | POA: Diagnosis not present

## 2022-03-11 DIAGNOSIS — C61 Malignant neoplasm of prostate: Secondary | ICD-10-CM

## 2022-03-11 LAB — GLUCOSE, CAPILLARY
Glucose-Capillary: 176 mg/dL — ABNORMAL HIGH (ref 70–99)
Glucose-Capillary: 162 mg/dL — ABNORMAL HIGH (ref 70–99)

## 2022-03-11 NOTE — Progress Notes (Signed)
NASIRE, REALI ([id number]) ?Visit Report for 03/11/2022 ?Problem List Details ?Patient Name: Date of Service: ?Andrew Estimable, [name] 03/11/2022 8:00 A M ?Medical Record Number: [id number] ?Patient Account Number: [id number] ?Date of Birth/Sex: Treating RN: ?April 14, 1956 (66 y.o. 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 03/11/2022 ?Medical Record Number: [id number] ?Patient Account Number: [id number] ?Date of Birth/Sex: Treating RN: ?[birth year] (67 y.o. 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 ?I10 Essential (primary) hypertension ?Facility Procedures ?CPT4 Code: 40102725 ?Description: G0277-(Facility Use Only) HBOT full body chamber, 84mn , ICD-10 Diagnosis Description C61 Malignant neoplasm of prostate N30.41  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

## 2022-03-11 NOTE — Progress Notes (Addendum)
DRAPER, GALLON ([id number]) ?Visit Report for 03/11/2022 ?Arrival Information Details ?Patient Name: Date of Service: ?Andrew Estimable, [name] 03/11/2022 8:00 A M ?Medical Record Number: [id number] ?Patient Account Number: [id number] ?Date of Birth/Sex: Treating RN: ?March 14, 1956 (66 y.o. Andrew [name] ?Primary Care Shyvonne Chastang: [name] ?Other Clinician: [name] ?Referring Conroy Goracke: ?Treating Cristabel Bicknell/Extender: [name] ?[name] ?Weeks in Treatment: 8 ?Visit Information History Since Last Visit ?All ordered tests and consults were completed: Yes ?Patient Arrived: Ambulatory ?Added or deleted any medications: No ?Arrival Time: 07:48 ?Any new allergies or adverse reactions: No ?Accompanied By: none ?Had a fall or experienced change in No ?Transfer Assistance: None ?activities of daily living that may affect ?Patient Identification Verified: Yes ?risk of falls: ?Secondary Verification Process Completed: Yes ?Signs or symptoms of abuse/neglect since last visito No ?Patient Requires Transmission-Based Precautions: No ?Hospitalized since last visit: No ?Patient Has Alerts: No ?Implantable device outside of the clinic excluding No ?cellular tissue based products placed in the center ?since last visit: ?Pain Present Now: No ?Notes ?Paper version used prior to the treatment. I asked the patient if he had any problems with his ear or sinus today. He stated no. ?Electronic Signature(s) ?Signed: 03/11/2022 1:06:24 PM By: [name] EMT ?Previous Signature: 03/11/2022 1:06:16 PM Version By: [name] EMT ?Entered By: [name] on 03/11/2022 13:06:23 ?-------------------------------------------------------------------------------- ?Encounter Discharge Information Details ?Patient Name: Date of Service: ?Andrew Estimable, [name] 03/11/2022 8:00 A M ?Medical Record Number: [id number] ?Patient Account Number: [id number] ?Date of Birth/Sex: Treating RN: ?October 19, 1956 (66 y.o. Andrew [name] ?Primary  Care Zareah Hunzeker: [name] ?Other Clinician: [name] ?Referring Kiandre Spagnolo: ?Treating Asusena Sigley/Extender: [name] ?[name] ?Weeks in Treatment: 8 ?Encounter Discharge Information Items ?Discharge Condition: Stable ?Ambulatory Status: Ambulatory ?Discharge Destination: Home ?Transportation: Private Auto ?Accompanied By: None ?Schedule Follow-up Appointment: Yes ?Clinical Summary of Care: ?Electronic Signature(s) ?Signed: 03/11/2022 3:14:20 PM By: [name] EMT ?Entered By: [name] on 03/11/2022 15:14:20 ?-------------------------------------------------------------------------------- ?Vitals Details ?Patient Name: ?Date of Service: ?Andrew Estimable, [name] 03/11/2022 8:00 A M ?Medical Record Number: [id number] ?Patient Account Number: [id number] ?Date of Birth/Sex: ?Treating RN: ?[birth year] (66 y.o. Andrew [name] ?Primary Care Petrita Blunck: [name] ?Other Clinician: [name] ?Referring Vamsi Apfel: ?Treating Athalee Esterline/Extender: [name] ?[name] ?Weeks in Treatment: 8 ?Vital Signs ?Time Taken: 08:00 ?Temperature (??F): 97.9 ?Height (in): 72 ?Pulse (bpm): 68 ?Weight (lbs): 260 ?Respiratory Rate (breaths/min): 14 ?Body Mass Index (BMI): 35.3 ?Blood Pressure (mmHg): 111/72 ?Capillary Blood Glucose (mg/dl): 176 ?Reference Range: 80 - 120 mg / dl ?Notes ?Paper version used prior to the treatment. ?Electronic Signature(s) ?Signed: 03/11/2022 1:07:06 PM By: [name] EMT ?Entered By: [name] on 03/11/2022 13:07:05 ?

## 2022-03-11 NOTE — Progress Notes (Addendum)
[name], MECKEL ([id number]) ?Visit Report for 03/11/2022 ?HBO Details ?Patient Name: Date of Service: ?Andrew Estimable, [name] 03/11/2022 8:00 A M ?Medical Record Number: [id number] ?Patient Account Number: [id number] ?Date of Birth/Sex: Treating RN: ?1956-06-30 (66 y.o. Andrew [name] ?Primary Care Chezney Huether: [name] ?Other Clinician: [name] ?Referring Edric Fetterman: ?Treating Gennesis Hogland/Extender: [name] ?[name] ?Weeks in Treatment: 8 ?HBO Treatment Course Details ?Treatment Course Number: 1 ?Ordering Eboney Claybrook: [name] ?T Treatments Ordered: ?otal 40 HBO Treatment Start Date: 01/20/2022 ?HBO Indication: ?Late Effect of Radiation ?HBO Treatment Details ?Treatment Number: 64 ?Patient Type: Outpatient ?Chamber Type: Monoplace ?Chamber Serial #: [id number] ?Treatment Protocol: 2.5 ATA with 90 minutes oxygen, with two 5 minute air breaks ?Treatment Details ?Compression Rate Down: 1.5 psi / minute ?De-Compression Rate Up: 1.0 psi / minute ?A breaks and breathing ?ir ?Compress Tx Pressure periods Decompress Decompress ?Begins Reached (leave unused spaces Begins Ends ?blank) ?Chamber Pressure (ATA 1 2.5 2.5 2.5 2.5 2.5 - - 2.5 1 ?) ?Clock Time (24 hr) 08:07 08:22 08:52 08:57 09:27 09:32 - - 10:03 10:26 ?Treatment Length: 139 (minutes) ?Treatment Segments: 5 ?Vital Signs ?Capillary Blood Glucose Reference Range: 80 - 120 mg / dl ?HBO Diabetic Blood Glucose Intervention Range: <131 mg/dl or >249 mg/dl ?Time Vitals Blood Respiratory Capillary Blood Glucose Pulse Action ?Type: ?Pulse: Temperature: ?Taken: ?Pressure: ?Rate: ?Glucose (mg/dl): ?Meter #: Oximetry (%) Taken: ?Pre 08:00 111/72 68 14 97.9 176 ?Post 10:32 105/58 59 16 97.9 162 ?Treatment Response ?Treatment Toleration: Well ?Treatment Completion Status: Treatment Completed without Adverse Event ?Treatment Notes ?Treatment went well today. No problems with ears, or sinuses. After treatment, I asked if he had any ear or sinus problems. He  stated that everything went ?good today. ?Physician HBO Attestation: ?I certify that I supervised this HBO treatment in accordance with Medicare ?guidelines. A trained emergency response team is readily available per Yes ?hospital policies and procedures. ?Continue HBOT as ordered. Yes ?Electronic Signature(s) ?Signed: 03/11/2022 4:47:25 PM By: [name] DO ?Previous Signature: 03/11/2022 3:13:19 PM Version By: [name] EMT ?Entered By: [name] on 03/11/2022 16:45:31 ?-------------------------------------------------------------------------------- ?HBO Safety Checklist Details ?Patient Name: ?Date of Service: ?Andrew Estimable, [name] 03/11/2022 8:00 A M ?Medical Record Number: [id number] ?Patient Account Number: [id number] ?Date of Birth/Sex: ?Treating RN: ?1956/09/30 (66 y.o. Andrew [name] ?Primary Care Andrew [name]: [name] ?Other Clinician: [name] ?Referring Andrew [name]: ?Treating Andrew [name]/Extender: [name] ?[name] ?Weeks in Treatment: 8 ?HBO Safety Checklist Items ?Safety Checklist ?Consent Form Signed ?Patient voided / foley secured and emptied ?When did you last eato 0630 ?Last dose of injectable or oral agent After treatment ?Ostomy pouch emptied and vented if applicable ?NA ?All implantable devices assessed, documented and approved ?NA ?Intravenous access site secured and place ?NA ?Valuables secured ?Linens and cotton and cotton/polyester blend (less than 51% polyester) ?Personal oil-based products / skin lotions / body lotions removed ?Wigs or hairpieces removed ?NA ?Smoking or tobacco materials removed ?Books / newspapers / magazines / loose paper removed ?Cologne, aftershave, perfume and deodorant removed ?Jewelry removed (may wrap wedding band) ?NA ?Make-up removed ?NA ?Hair care products removed ?Battery operated devices (external) removed ?Heating patches and chemical warmers removed ?Titanium eyewear removed ?NA ?Nail polish cured greater than 10  hours ?NA ?Casting material cured greater than 10 hours ?NA ?Hearing aids removed ?NA ?Loose dentures or partials removed ?NA ?Prosthetics have been removed ?NA ?Patient demonstrates correct use of air break device (if applicable) ?Patient concerns have been addressed ?Patient grounding bracelet  on and cord attached to chamber ?Specifics for Inpatients (complete in addition to above) ?Medication sheet sent with patient ?NA ?Intravenous medications needed or due during therapy sent with patient ?NA ?Drainage tubes (e.g. nasogastric tube or chest tube secured and vented) ?NA ?Endotracheal or Tracheotomy tube secured ?NA ?Cuff deflated of air and inflated with saline ?NA ?Airway suctioned ?NA ?Notes ?Paper version used prior to the treatment. ?Electronic Signature(s) ?Signed: 03/11/2022 1:08:05 PM By: [name] EMT ?Entered By: [name] on 03/11/2022 13:08:04 ?

## 2022-03-12 ENCOUNTER — Other Ambulatory Visit: Payer: Self-pay

## 2022-03-12 ENCOUNTER — Encounter (HOSPITAL_BASED_OUTPATIENT_CLINIC_OR_DEPARTMENT_OTHER): Payer: 59 | Admitting: General Surgery

## 2022-03-12 DIAGNOSIS — C61 Malignant neoplasm of prostate: Secondary | ICD-10-CM | POA: Diagnosis not present

## 2022-03-12 LAB — GLUCOSE, CAPILLARY
Glucose-Capillary: 177 mg/dL — ABNORMAL HIGH (ref 70–99)
Glucose-Capillary: 156 mg/dL — ABNORMAL HIGH (ref 70–99)

## 2022-03-12 NOTE — Progress Notes (Addendum)
NICLAS, MARKELL ([id number]) ?Visit Report for 03/12/2022 ?Problem List Details ?Patient Name: Date of Service: ?[name], [name] 03/12/2022 7:45 A M ?Medical Record Number: [id number] ?Patient Account Number: [id number] ?Date of Birth/Sex: Treating RN: ?07-May-1956 (66 y.o. 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 03/12/2022 ?Medical Record Number: [id number] ?Patient Account Number: [id number] ?Date of Birth/Sex: Treating RN: ?Jun 15, 1956 (66 y.o. 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 ?I10 Essential (primary) hypertension ?Facility Procedures ?CPT4 Code: [id number] ?Description: G0277-(Facility Use Only) HBOT full body chamber, 63mn , ICD-10 Diagnosis Description N30.41  Irradiation cystitis with hematuria C61 Malignant neoplasm of prostate 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 IF02 ?Essential (primary) hypertension ?Modifier: ?Quantity: 5 ?Physician Procedures ?: CPT4 Code Description Modifier [id number] 50277- WC PHYS HYPERBARIC OXYGEN THERAPY ICD-10 Diagnosis Description N30.41 Irradiation cystitis with hematuria C61 Malignant neoplasm of prostate E11.59 Type 2 diabetes mellitus with other circulatory  ?complications IA12Essential (primary) hypertension ?Quantity: 1 ?Electronic Signature(s) ?Signed: 03/12/2022 4:05:12 PM By: GValeria BatmanEMT ?Signed: 03/12/2022 4:48:09 PM By: CFredirick MaudlinMD FACS ?Previous Signature: 03/12/2022 3:05:26 PM Version By: CFredirick MaudlinMD FACS ?Previous Signature: 03/12/2022 10:44:05 AM Version By: GValeria BatmanEMT ?Entered By: GValeria Batmanon 03/12/2022 16:05:12 ?

## 2022-03-12 NOTE — Progress Notes (Addendum)
Andrew [name], Andrew [name] ([id number]) ?Visit Report for 03/12/2022 ?Arrival Information Details ?Patient Name: Date of Service: ?[name], [name] 03/12/2022 7:45 A M ?Medical Record Number: [id number] ?Patient Account Number: [id number] ?Date of Birth/Sex: Treating RN: ?1956/07/05 (66 y.o. Andrew [name] ?Primary Care Andrew [name]: Andrew [name] ?Other Clinician: [name] ?Referring Andrew [name]: ?Treating Andrew [name]/Extender: Fredirick [name] ?Andrew [name] ?Weeks in Treatment: 8 ?Visit Information History Since Last Visit ?All ordered tests and consults were completed: Yes ?Patient Arrived: Ambulatory ?Added or deleted any medications: No ?Arrival Time: 07:48 ?Any new allergies or adverse reactions: No ?Accompanied By: None ?Had a fall or experienced change in No ?Transfer Assistance: None ?activities of daily living that may affect ?Patient Identification Verified: Yes ?risk of falls: ?Secondary Verification Process Completed: Yes ?Signs or symptoms of abuse/neglect since last visito No ?Patient Requires Transmission-Based Precautions: No ?Hospitalized since last visit: No ?Patient Has Alerts: No ?Implantable device outside of the clinic excluding No ?cellular tissue based products placed in the center ?since last visit: ?Pain Present Now: No ?Notes ?Paper version used prior to the treatment. ?Electronic Signature(s) ?Signed: 03/12/2022 9:23:49 AM By: [name] EMT ?Entered By: [name] on 03/12/2022 09:23:48 ?-------------------------------------------------------------------------------- ?Encounter Discharge Information Details ?Patient Name: Date of Service: ?[name], [name] 03/12/2022 7:45 A M ?Medical Record Number: [id number] ?Patient Account Number: [id number] ?Date of Birth/Sex: Treating RN: ?February 19, 1956 (66 y.o. Andrew [name] ?Primary Care Andrew [name]: Andrew [name] ?Other Clinician: [name] ?Referring Rosia Syme: ?Treating Andrew [name]/Extender: Fredirick [name] ?Andrew [name] ?Weeks  in Treatment: 8 ?Encounter Discharge Information Items ?Discharge Condition: Stable ?Ambulatory Status: Ambulatory ?Discharge Destination: Home ?Transportation: Private Auto ?Accompanied By: None ?Schedule Follow-up Appointment: Yes ?Clinical Summary of Care: ?Electronic Signature(s) ?Signed: 03/12/2022 10:45:53 AM By: [name] EMT ?Entered By: [name] on 03/12/2022 10:45:53 ?-------------------------------------------------------------------------------- ?Vitals Details ?Patient Name: ?Date of Service: ?[name], [name] 03/12/2022 7:45 A M ?Medical Record Number: [id number] ?Patient Account Number: [id number] ?Date of Birth/Sex: ?Treating RN: ?December 08, 1956 (66 y.o. Andrew [name] ?Primary Care Andrew [name]: Andrew [name] ?Other Clinician: [name] ?Referring Andrew [name]: ?Treating Andrew [name]/Extender: Fredirick [name] ?Andrew [name] ?Weeks in Treatment: 8 ?Vital Signs ?Time Taken: 08:02 ?Temperature (??F): 98.2 ?Height (in): 72 ?Pulse (bpm): 82 ?Weight (lbs): 260 ?Respiratory Rate (breaths/min): 18 ?Body Mass Index (BMI): 35.3 ?Blood Pressure (mmHg): 122/80 ?Capillary Blood Glucose (mg/dl): 177 ?Reference Range: 80 - 120 mg / dl ?Notes ?Paper version was used prior to the treatment. ?Electronic Signature(s) ?Signed: 03/12/2022 9:39:23 AM By: [name] EMT ?Entered By: [name] on 03/12/2022 09:39:23 ?

## 2022-03-12 NOTE — Progress Notes (Addendum)
ERBIE, ARMENT ([id number]) ?Visit Report for 03/12/2022 ?HBO Details ?Patient Name: Date of Service: ?[name], [name] 03/12/2022 7:45 A M ?Medical Record Number: [id number] ?Patient Account Number: [id number] ?Date of Birth/Sex: Treating RN: ?Jun 05, 1956 (66 y.o. Andrew [name] ?Primary Care Emilyn Ruble: Andrew [name] ?Other Clinician: [name] ?Referring Shaindy Reader: ?Treating Kaytlynne Neace/Extender: [name] ?Andrew [name] ?Weeks in Treatment: 8 ?HBO Treatment Course Details ?Treatment Course Number: 1 ?Ordering Ladeana Laplant: [name] ?T Treatments Ordered: ?otal 40 HBO Treatment Start Date: 01/20/2022 ?HBO Indication: ?Late Effect of Radiation ?HBO Treatment Details ?Treatment Number: 32 ?Patient Type: Outpatient ?Chamber Type: Monoplace ?Chamber Serial #: [id number] ?Treatment Protocol: 2.5 ATA with 90 minutes oxygen, with two 5 minute air breaks ?Treatment Details ?Compression Rate Down: 1.5 psi / minute ?De-Compression Rate Up: 1.5 psi / minute ?A breaks and breathing ?ir ?Compress Tx Pressure periods Decompress Decompress ?Begins Reached (leave unused spaces Begins Ends ?blank) ?Chamber Pressure (ATA 1 2.5 2.5 2.5 2.5 2.5 - - 2.5 1 ?) ?Clock Time (24 hr) 08:06 08:22 08:52 08:57 09:27 09:32 - - 10:03 10:22 ?Treatment Length: 136 (minutes) ?Treatment Segments: 5 ?Vital Signs ?Capillary Blood Glucose Reference Range: 80 - 120 mg / dl ?HBO Diabetic Blood Glucose Intervention Range: <131 mg/dl or >249 mg/dl ?Time Vitals Blood Respiratory Capillary Blood Glucose Pulse Action ?Type: ?Pulse: Temperature: ?Taken: ?Pressure: ?Rate: ?Glucose (mg/dl): ?Meter #: Oximetry (%) Taken: ?Pre 08:02 122/80 82 18 98.2 177 ?Post 10:33 122/69 63 16 97.9 156 ?Treatment Response ?Treatment Toleration: Well ?Treatment Completion Status: Treatment Completed without Adverse Event ?Treatment Notes ?Paper version was used prior to the treatment. I asked the patient if he had any problems with his ear or sinus today, after  treatment. He stated no. ?Physician HBO Attestation: ?I certify that I supervised this HBO treatment in accordance with Medicare ?guidelines. A trained emergency response team is readily available per Yes ?hospital policies and procedures. ?Continue HBOT as ordered. Yes ?Electronic Signature(s) ?Signed: 03/12/2022 3:05:10 PM By: [name] ?Previous Signature: 03/12/2022 10:42:25 AM Version By: [name] EMT ?Previous Signature: 03/12/2022 9:41:59 AM Version By: [name] EMT ?Entered By: [name] on 03/12/2022 15:05:09 ?-------------------------------------------------------------------------------- ?HBO Safety Checklist Details ?Patient Name: ?Date of Service: ?[name], [name] 03/12/2022 7:45 A M ?Medical Record Number: [id number] ?Patient Account Number: [id number] ?Date of Birth/Sex: ?Treating RN: ?30-Jun-1956 (66 y.o. Andrew [name] ?Primary Care Christyana Corwin: Andrew [name] ?Other Clinician: [name] ?Referring Semaj Coburn: ?Treating Chike Farrington/Extender: [name] ?Andrew [name] ?Weeks in Treatment: 8 ?HBO Safety Checklist Items ?Safety Checklist ?Consent Form Signed ?Patient voided / foley secured and emptied ?When did you last eato 0630 ?Last dose of injectable or oral agent After treatment ?Ostomy pouch emptied and vented if applicable ?NA ?All implantable devices assessed, documented and approved ?NA ?Intravenous access site secured and place ?NA ?Valuables secured ?Linens and cotton and cotton/polyester blend (less than 51% polyester) ?Personal oil-based products / skin lotions / body lotions removed ?Wigs or hairpieces removed ?NA ?Smoking or tobacco materials removed ?Books / newspapers / magazines / loose paper removed ?Cologne, aftershave, perfume and deodorant removed ?Jewelry removed (may wrap wedding band) ?NA ?Make-up removed ?NA ?Hair care products removed ?Battery operated devices (external) removed ?Heating patches and chemical warmers  removed ?Titanium eyewear removed ?NA ?Nail polish cured greater than 10 hours ?NA ?Casting material cured greater than 10 hours ?NA ?Hearing aids removed ?NA ?Loose dentures or partials removed ?NA ?Prosthetics have been removed ?NA ?Patient demonstrates correct use of air break device (  if applicable) ?Patient concerns have been addressed ?Patient grounding bracelet on and cord attached to chamber ?Specifics for Inpatients (complete in addition to above) ?Medication sheet sent with patient ?NA ?Intravenous medications needed or due during therapy sent with patient ?NA ?Drainage tubes (e.g. nasogastric tube or chest tube secured and vented) ?NA ?Endotracheal or Tracheotomy tube secured ?NA ?Cuff deflated of air and inflated with saline ?NA ?Airway suctioned ?NA ?Notes ?Paper version was used prior to the treatment. I asked the patient if he had any problems with his ear or sinus today. He stated no. ?Electronic Signature(s) ?Signed: 03/12/2022 9:41:03 AM By: [name] EMT ?Entered By: [name] on 03/12/2022 09:41:03 ?

## 2022-03-13 ENCOUNTER — Encounter (HOSPITAL_BASED_OUTPATIENT_CLINIC_OR_DEPARTMENT_OTHER): Payer: 59 | Admitting: General Surgery

## 2022-03-13 DIAGNOSIS — C61 Malignant neoplasm of prostate: Secondary | ICD-10-CM | POA: Diagnosis not present

## 2022-03-13 LAB — GLUCOSE, CAPILLARY
Glucose-Capillary: 185 mg/dL — ABNORMAL HIGH (ref 70–99)
Glucose-Capillary: 163 mg/dL — ABNORMAL HIGH (ref 70–99)

## 2022-03-13 NOTE — Progress Notes (Signed)
[place], Andrew [name] ([id number]) ?Visit Report for 03/13/2022 ?Problem List Details ?Patient Name: Date of Service: ?[name], [name] 03/13/2022 7:45 A M ?Medical Record Number: [id number] ?Patient Account Number: [id number] ?Date of Birth/Sex: Treating RN: ?1956/12/17 (66 y.o. 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 03/13/2022 ?Medical Record Number: [id number] ?Patient Account Number: [id number] ?Date of Birth/Sex: Treating RN: ?07/06/1956 (66 y.o. 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 ?I10 Essential (primary) hypertension ?Facility Procedures ?CPT4 Code: 28366294 ?Description: G0277-(Facility Use Only) HBOT full body chamber, 80mn , ICD-10 Diagnosis Description N30.41  Irradiation cystitis with hematuria C61 Malignant neoplasm of prostate 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 IT65 ?Essential (primary) hypertension ?Modifier: ?Quantity: 5 ?Physician Procedures ?: CPT4 Code Description Modifier [id number] PHYS HYPERBARIC OXYGEN THERAPY ICD-10 Diagnosis Description N30.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

## 2022-03-13 NOTE — Progress Notes (Addendum)
MARKEZ, DOWLAND ([id number]) ?Visit Report for 03/13/2022 ?Arrival Information Details ?Patient Name: Date of Service: ?[name], [name] 03/13/2022 7:45 A M ?Medical Record Number: [id number] ?Patient Account Number: [id number] ?Date of Birth/Sex: Treating RN: ?[birth year] (66 y.o. [name] ?Primary Care Valencia Kassa: [name] ?Other Clinician: [name] ?Referring Molli Gethers: ?Treating Keryn Nessler/Extender: [name] ?[name] ?Weeks in Treatment: 8 ?Visit Information History Since Last Visit ?All ordered tests and consults were completed: Yes ?Patient Arrived: Ambulatory ?Added or deleted any medications: No ?Arrival Time: 07:47 ?Any new allergies or adverse reactions: No ?Accompanied By: None ?Had a fall or experienced change in No ?Transfer Assistance: None ?activities of daily living that may affect ?Patient Identification Verified: Yes ?risk of falls: ?Secondary Verification Process Completed: Yes ?Signs or symptoms of abuse/neglect since last visito No ?Patient Requires Transmission-Based Precautions: No ?Hospitalized since last visit: No ?Patient Has Alerts: No ?Implantable device outside of the clinic excluding No ?cellular tissue based products placed in the center ?since last visit: ?Pain Present Now: No ?Electronic Signature(s) ?Signed: 03/13/2022 8:12:00 AM By: [name] EMT ?Entered By: [name] on 03/13/2022 08:12:00 ?-------------------------------------------------------------------------------- ?Encounter Discharge Information Details ?Patient Name: Date of Service: ?[name], [name] 03/13/2022 7:45 A M ?Medical Record Number: [id number] ?Patient Account Number: [id number] ?Date of Birth/Sex: Treating RN: ?[birth year] (66 y.o. [name] ?Primary Care Jaline Pincock: [name] ?Other Clinician: [name] ?Referring Rhys Anchondo: ?Treating Breyson Kelm/Extender: [name] ?[name] ?Weeks in Treatment: 8 ?Encounter Discharge Information  Items ?Discharge Condition: Stable ?Ambulatory Status: Ambulatory ?Discharge Destination: Home ?Transportation: Private Auto ?Accompanied By: None ?Schedule Follow-up Appointment: Yes ?Clinical Summary of Care: ?Electronic Signature(s) ?Signed: 03/13/2022 12:18:49 PM By: [name] EMT ?Entered By: [name] on 03/13/2022 12:18:49 ?-------------------------------------------------------------------------------- ?Vitals Details ?Patient Name: ?Date of Service: ?[name], [name] 03/13/2022 7:45 A M ?Medical Record Number: [id number] ?Patient Account Number: [id number] ?Date of Birth/Sex: ?Treating RN: ?Dec 21, 1956 (66 y.o. [name] ?Primary Care Roshawnda Pecora: [name] ?Other Clinician: [name] ?Referring Jonta Gastineau: ?Treating Jamy Whyte/Extender: [name] ?[name] ?Weeks in Treatment: 8 ?Vital Signs ?Time Taken: 08:02 ?Temperature (??F): 97.9 ?Height (in): 72 ?Pulse (bpm): 64 ?Weight (lbs): 260 ?Respiratory Rate (breaths/min): 18 ?Body Mass Index (BMI): 35.3 ?Blood Pressure (mmHg): 116/70 ?Capillary Blood Glucose (mg/dl): 185 ?Reference Range: 80 - 120 mg / dl ?Electronic Signature(s) ?Signed: 03/13/2022 8:21:35 AM By: [name] EMT ?Entered By: [name] on 03/13/2022 08:21:34 ?

## 2022-03-13 NOTE — Progress Notes (Addendum)
Andrew [name], Andrew [name] ([id number]) ?Visit Report for 03/13/2022 ?HBO Details ?Patient Name: Date of Service: ?Andrew Estimable, [name] 03/13/2022 7:45 A M ?Medical Record Number: [id number] ?Patient Account Number: [id number] ?Date of Birth/Sex: Treating RN: ?27-Mar-1956 (66 y.o. [name] ?Primary Care [name]: [name] ?Other Clinician: [name] ?Referring [name]: ?Treating [name]/Extender: [name] ?[name] ?Weeks in Treatment: 8 ?HBO Treatment Course Details ?Treatment Course Number: 1 ?Ordering Nikesha Kwasny: [name] ?T Treatments Ordered: ?otal 40 HBO Treatment Start Date: 01/20/2022 ?HBO Indication: ?Late Effect of Radiation ?HBO Treatment Details ?Treatment Number: 38 ?Patient Type: Outpatient ?Chamber Type: Monoplace ?Chamber Serial #: [id number] ?Treatment Protocol: 2.5 ATA with 90 minutes oxygen, with two 5 minute air breaks ?Treatment Details ?Compression Rate Down: 1.5 psi / minute ?De-Compression Rate Up: 1.5 psi / minute ?A breaks and breathing ?ir ?Compress Tx Pressure periods Decompress Decompress ?Begins Reached (leave unused spaces Begins Ends ?blank) ?Chamber Pressure (ATA 1 2.5 2.5 2.5 2.5 2.5 - - 2.5 1 ?) ?Clock Time (24 hr) 08:04 08:22 08:52 08:57 09:27 09:33 - - 10:03 10:22 ?Treatment Length: 138 (minutes) ?Treatment Segments: 5 ?Vital Signs ?Capillary Blood Glucose Reference Range: 80 - 120 mg / dl ?HBO Diabetic Blood Glucose Intervention Range: <131 mg/dl or >249 mg/dl ?Time Vitals Blood Respiratory Capillary Blood Glucose Pulse Action ?Type: ?Pulse: Temperature: ?Taken: ?Pressure: ?Rate: ?Glucose (mg/dl): ?Meter #: Oximetry (%) Taken: ?Pre 08:02 116/70 64 18 97.9 185 ?Post 10:26 110/74 59 18 97.9 163 ?Treatment Response ?Treatment Toleration: Well ?Treatment Completion Status: Treatment Completed without Adverse Event ?Treatment Notes ?Paper version was used prior to the treatment. I asked the patient if he had any problems with his ear or sinus today, after  treatment. He stated no. ?Physician HBO Attestation: ?I certify that I supervised this HBO treatment in accordance with Medicare ?guidelines. A trained emergency response team is readily available per Yes ?hospital policies and procedures. ?Continue HBOT as ordered. Yes ?Electronic Signature(s) ?Signed: 03/13/2022 12:34:27 PM By: [name] ?Previous Signature: 03/13/2022 12:17:11 PM Version By: [name] EMT ?Entered By: [name] on 03/13/2022 12:34:27 ?-------------------------------------------------------------------------------- ?HBO Safety Checklist Details ?Patient Name: ?Date of Service: ?Andrew Estimable, [name] 03/13/2022 7:45 A M ?Medical Record Number: [id number] ?Patient Account Number: [id number] ?Date of Birth/Sex: ?Treating RN: ?06/02/1956 (66 y.o. [name] ?Primary Care [name]: [name] ?Other Clinician: [name] ?Referring Jade Burright: ?Treating Jaliza Seifried/Extender: [name] ?[name] ?Weeks in Treatment: 8 ?HBO Safety Checklist Items ?Safety Checklist ?Consent Form Signed ?Patient voided / foley secured and emptied ?When did you last eato 0630 ?Last dose of injectable or oral agent After treatment ?Ostomy pouch emptied and vented if applicable ?NA ?All implantable devices assessed, documented and approved ?NA ?Intravenous access site secured and place ?NA ?Valuables secured ?Linens and cotton and cotton/polyester blend (less than 51% polyester) ?Personal oil-based products / skin lotions / body lotions removed ?Wigs or hairpieces removed ?NA ?Smoking or tobacco materials removed ?Books / newspapers / magazines / loose paper removed ?Cologne, aftershave, perfume and deodorant removed ?Jewelry removed (may wrap wedding band) ?NA ?Make-up removed ?NA ?Hair care products removed ?Battery operated devices (external) removed ?Heating patches and chemical warmers removed ?Titanium eyewear removed ?NA ?Nail polish cured greater than 10  hours ?NA ?Casting material cured greater than 10 hours ?NA ?Hearing aids removed ?NA ?Loose dentures or partials removed ?NA ?Prosthetics have been removed ?NA ?Patient demonstrates correct use of air break device (if applicable) ?Patient concerns have been addressed ?Patient grounding bracelet  on and cord attached to chamber ?Specifics for Inpatients (complete in addition to above) ?Medication sheet sent with patient ?NA ?Intravenous medications needed or due during therapy sent with patient ?NA ?Drainage tubes ([place]. nasogastric tube or chest tube secured and vented) ?NA ?Endotracheal or Tracheotomy tube secured ?NA ?Cuff deflated of air and inflated with saline ?NA ?Airway suctioned ?NA ?Notes ?The patient informed me that he felt great today, and had no problems with his ears or sinus today. ?Electronic Signature(s) ?Signed: 03/13/2022 8:25:19 AM By: [name] EMT ?Entered By: [name] on 03/13/2022 08:25:19 ?

## 2022-03-14 ENCOUNTER — Other Ambulatory Visit: Payer: Self-pay

## 2022-03-14 ENCOUNTER — Encounter (HOSPITAL_BASED_OUTPATIENT_CLINIC_OR_DEPARTMENT_OTHER): Payer: 59 | Admitting: Internal Medicine

## 2022-03-14 DIAGNOSIS — C61 Malignant neoplasm of prostate: Secondary | ICD-10-CM

## 2022-03-14 DIAGNOSIS — I10 Essential (primary) hypertension: Secondary | ICD-10-CM | POA: Diagnosis not present

## 2022-03-14 DIAGNOSIS — N3041 Irradiation cystitis with hematuria: Secondary | ICD-10-CM | POA: Diagnosis not present

## 2022-03-14 DIAGNOSIS — E1159 Type 2 diabetes mellitus with other circulatory complications: Secondary | ICD-10-CM | POA: Diagnosis not present

## 2022-03-14 LAB — GLUCOSE, CAPILLARY
Glucose-Capillary: 157 mg/dL — ABNORMAL HIGH (ref 70–99)
Glucose-Capillary: 162 mg/dL — ABNORMAL HIGH (ref 70–99)

## 2022-03-14 NOTE — Progress Notes (Addendum)
DEMETRIO, LEIGHTY ([id number]) ?Visit Report for 03/14/2022 ?Arrival Information Details ?Patient Name: Date of Service: ?Andrew Estimable, [name] 03/14/2022 7:45 A M ?Medical Record Number: [id number] ?Patient Account Number: [id number] ?Date of Birth/Sex: Treating RN: ?25-Jan-1956 (66 y.o. Andrew [name] ?Primary Care Korbyn Vanes: Andrew [name] ?Other Clinician: [name] ?Referring Chequita Mofield: ?Treating Hazelee Harbold/Extender: [name] ?Andrew [name] ?Weeks in Treatment: 8 ?Visit Information History Since Last Visit ?All ordered tests and consults were completed: Yes ?Patient Arrived: Ambulatory ?Added or deleted any medications: No ?Arrival Time: 07:50 ?Any new allergies or adverse reactions: No ?Accompanied By: None ?Had a fall or experienced change in No ?Transfer Assistance: None ?activities of daily living that may affect ?Patient Identification Verified: Yes ?risk of falls: ?Secondary Verification Process Completed: Yes ?Signs or symptoms of abuse/neglect since last visito No ?Patient Requires Transmission-Based Precautions: No ?Hospitalized since last visit: No ?Patient Has Alerts: No ?Implantable device outside of the clinic excluding No ?cellular tissue based products placed in the center ?since last visit: ?Pain Present Now: No ?Notes ?Paper version was used prior to the treatment. ?Electronic Signature(s) ?Signed: 03/14/2022 12:34:00 PM By: [name] EMT ?Entered By: [name] on 03/14/2022 12:34:00 ?-------------------------------------------------------------------------------- ?Encounter Discharge Information Details ?Patient Name: Date of Service: ?Andrew Estimable, [name] 03/14/2022 7:45 A M ?Medical Record Number: [id number] ?Patient Account Number: [id number] ?Date of Birth/Sex: Treating RN: ?[birth year] (66 y.o. Andrew [name] ?Primary Care Gittel Mccamish: Andrew [name] ?Other Clinician: [name] ?Referring Annalia Metzger: ?Treating Purvi Ruehl/Extender: [name] ?Andrew [name] ?Weeks in Treatment: 8 ?Encounter Discharge Information Items ?Discharge Condition: Stable ?Ambulatory Status: Ambulatory ?Discharge Destination: Home ?Transportation: Private Auto ?Accompanied By: None ?Schedule Follow-up Appointment: No ?Clinical Summary of Care: ?Notes ?The patient to see Dr. Heber [place]ill[place] after his treatment ?Electronic Signature(s) ?Signed: 03/14/2022 2:00:02 PM By: [name] EMT ?Entered By: [name] on 03/14/2022 14:00:02 ?-------------------------------------------------------------------------------- ?Vitals Details ?Patient Name: ?Date of Service: ?Andrew Estimable, [name] 03/14/2022 7:45 A M ?Medical Record Number: [id number] ?Patient Account Number: [id number] ?Date of Birth/Sex: ?Treating RN: ?[birth year] (66 y.o. Andrew [name] ?Primary Care Persephonie Hegwood: Andrew [name] ?Other Clinician: [name] ?Referring Amelio Brosky: ?Treating Carliyah Cotterman/Extender: [name] ?Andrew [name] ?Weeks in Treatment: 8 ?Vital Signs ?Time Taken: 08:00 ?Temperature (??F): 98.3 ?Height (in): 72 ?Pulse (bpm): 65 ?Weight (lbs): 260 ?Respiratory Rate (breaths/min): 16 ?Body Mass Index (BMI): 35.3 ?Blood Pressure (mmHg): 114/79 ?Capillary Blood Glucose (mg/dl): 157 ?Reference Range: 80 - 120 mg / dl ?Notes ?Paper version was used prior to the treatment. I asked the patient if he had any ear or sinus problems today before treatment. He stated no. ?Electronic Signature(s) ?Signed: 03/14/2022 12:36:18 PM By: [name] EMT ?Entered By: [name] on 03/14/2022 12:36:18 ?

## 2022-03-14 NOTE — Progress Notes (Addendum)
Andrew [name], Andrew [name] ([id number]) ?Visit Report for 03/14/2022 ?HBO Details ?Patient Name: Date of Service: ?[name], [name] 03/14/2022 7:45 A M ?Medical Record Number: [id number] ?Patient Account Number: [id number] ?Date of Birth/Sex: Treating RN: ?[birth year] (66 y.o. Andrew [name] ?Primary Care Emiley Digiacomo: [name] ?Other Clinician: [name] ?Referring Ayiden Milliman: ?Treating Mionna Advincula/Extender: [name] ?[name] ?Weeks in Treatment: 8 ?HBO Treatment Course Details ?Treatment Course Number: 1 ?Ordering Jaysen Wey: [name] ?T Treatments Ordered: ?otal 40 HBO Treatment Start 01/20/2022 ?Date: ?HBO Indication: ?Late Effect of Radiation HBO Treatment End 03/14/2022 ?Date: ?HBO Discharge Treatment Series Complete; STRN/ORN Protocol ?Outcome: Goals Achieved ?HBO Treatment Details ?Treatment Number: 32 ?Patient Type: Outpatient ?Chamber Type: Monoplace ?Chamber Serial #: [id number] ?Treatment Protocol: 2.5 ATA with 90 minutes oxygen, with two 5 minute air breaks ?Treatment Details ?Compression Rate Down: 1.5 psi / minute ?De-Compression Rate Up: 1.5 psi / minute ?A breaks and breathing ?ir ?Compress Tx Pressure periods Decompress Decompress ?Begins Reached (leave unused spaces Begins Ends ?blank) ?Chamber Pressure (ATA 1 2.5 2.5 2.5 2.5 2.5 - - 2.5 1 ?) ?Clock Time (24 hr) 08:06 08:24 08:54 08:59 09:29 09:34 - - 10:05 10:22 ?Treatment Length: 136 (minutes) ?Treatment Segments: 5 ?Vital Signs ?Capillary Blood Glucose Reference Range: 80 - 120 mg / dl ?HBO Diabetic Blood Glucose Intervention Range: <131 mg/dl or >249 mg/dl ?Time Vitals Blood Respiratory Capillary Blood Glucose Pulse Action ?Type: ?Pulse: Temperature: ?Taken: ?Pressure: ?Rate: ?Glucose (mg/dl): ?Meter #: Oximetry (%) Taken: ?Pre 08:00 114/79 65 16 98.3 157 ?Post 10:29 114/80 60 14 97.8 162 ?Treatment Response ?Treatment Toleration: Well ?Treatment Completion Status: Treatment Completed without Adverse Event ?Treatment Notes ?Paper  version was used prior to the treatment. The patient has a history of having problems clearing his ears. Treatment started at 1.0 PSI/min until 4 PSI, ?then increased to 2.0 PSI to 2.5 ATA. The patient did not have any problems clearing his ears today. ?Dr. Alvera Singh patient after treatment 3/24. He has completed 39 treatments for his radiation cystitis. He reports complete resolution of his hematuria. He ?is overall content with his treatments. He has no issues or complaints today. ?Physician HBO Attestation: ?I certify that I supervised this HBO treatment in accordance with Medicare ?guidelines. A trained emergency response team is readily available per Yes ?hospital policies and procedures. ?Continue HBOT as ordered. Yes ?Electronic Signature(s) ?Signed: 03/17/2022 9:44:23 AM By: [name] DO ?Previous Signature: 03/14/2022 1:54:16 PM Version By: [name] EMT ?Entered By: [name] on 03/17/2022 09:43:26 ?-------------------------------------------------------------------------------- ?HBO Safety Checklist Details ?Patient Name: ?Date of Service: ?[name], [name] 03/14/2022 7:45 A M ?Medical Record Number: [id number] ?Patient Account Number: [id number] ?Date of Birth/Sex: ?Treating RN: ?[birth year] (66 y.o. Andrew [name] ?Primary Care Leone Putman: [name] ?Other Clinician: [name] ?Referring Kyros Salzwedel: ?Treating Mckinzee Spirito/Extender: [name] ?[name] ?Weeks in Treatment: 8 ?HBO Safety Checklist Items ?Safety Checklist ?Consent Form Signed ?Patient voided / foley secured and emptied ?When did you last eato 0630 ?Last dose of injectable or oral agent After treatment ?Ostomy pouch emptied and vented if applicable ?NA ?All implantable devices assessed, documented and approved ?NA ?Intravenous access site secured and place ?NA ?Valuables secured ?Linens and cotton and cotton/polyester blend (less than 51% polyester) ?Personal oil-based products / skin lotions / body  lotions removed ?Wigs or hairpieces removed ?NA ?Smoking or tobacco materials removed ?Books / newspapers / magazines / loose paper removed ?Cologne, aftershave, perfume and deodorant removed ?Jewelry removed (may wrap wedding band) ?NA ?Make-up removed ?NA ?  Hair care products removed ?Battery operated devices (external) removed ?Heating patches and chemical warmers removed ?Titanium eyewear removed ?NA ?Nail polish cured greater than 10 hours ?NA ?Casting material cured greater than 10 hours ?NA ?Hearing aids removed ?NA ?Loose dentures or partials removed ?NA ?Prosthetics have been removed ?NA ?Patient demonstrates correct use of air break device (if applicable) ?Patient concerns have been addressed ?Patient grounding bracelet on and cord attached to chamber ?Specifics for Inpatients (complete in addition to above) ?Medication sheet sent with patient ?NA ?Intravenous medications needed or due during therapy sent with patient ?NA ?Drainage tubes (e.g. nasogastric tube or chest tube secured and vented) ?NA ?Endotracheal or Tracheotomy tube secured ?NA ?Cuff deflated of air and inflated with saline ?NA ?Airway suctioned ?NA ?Electronic Signature(s) ?Signed: 03/14/2022 12:37:41 PM By: [name] EMT ?Entered By: [name] on 03/14/2022 12:37:41 ?

## 2022-03-17 NOTE — Progress Notes (Signed)
[name], [name] ([id number]) ?Visit Report for 03/14/2022 ?Problem List Details ?Patient Name: Date of Service: ?Andrew Estimable, [name] 03/14/2022 7:45 A M ?Medical Record Number: [id number] ?Patient Account Number: [id number] ?Date of Birth/Sex: Treating RN: ?1956-09-30 (66 y.o. 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 03/14/2022 ?Medical Record Number: [id number] ?Patient Account Number: [id number] ?Date of Birth/Sex: Treating RN: ?March 02, 1956 (66 y.o. 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 ?I10 Essential (primary) hypertension ?Facility Procedures ?CPT4 Code: [id number] ?Description: G0277-(Facility Use Only) HBOT full body chamber, 54mn , ICD-10 Diagnosis Description N30.41 Irradiation cystitis with hematuria  C61 Malignant neoplasm of prostate 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

## 2023-04-01 ENCOUNTER — Encounter: Payer: Self-pay | Admitting: Gastroenterology

## 2024-06-13 ENCOUNTER — Ambulatory Visit (INDEPENDENT_AMBULATORY_CARE_PROVIDER_SITE_OTHER)

## 2024-06-13 ENCOUNTER — Ambulatory Visit (INDEPENDENT_AMBULATORY_CARE_PROVIDER_SITE_OTHER): Admitting: Podiatry

## 2024-06-13 ENCOUNTER — Ambulatory Visit: Admitting: Podiatry

## 2024-06-13 ENCOUNTER — Encounter: Payer: Self-pay | Admitting: Podiatry

## 2024-06-13 VITALS — Ht 72.0 in | Wt 286.0 lb

## 2024-06-13 DIAGNOSIS — M7671 Peroneal tendinitis, right leg: Secondary | ICD-10-CM

## 2024-06-13 DIAGNOSIS — M722 Plantar fascial fibromatosis: Secondary | ICD-10-CM | POA: Diagnosis not present

## 2024-06-14 NOTE — Progress Notes (Signed)
 Subjective:   Patient ID: Andrew [name], male   DOB: 67 y.o.   MRN: [id number]   HPI Patient presents concerned because he was getting numbness and pain in the bottom of his right heel.  States it is improved some but he still wanted to check to make sure nothing else was abnormal and patient does not smoke tries to stay active   Review of Systems  All other systems reviewed and are negative.       Objective:  Physical Exam Vitals and nursing note reviewed.  Constitutional:      Appearance: He is well-developed.  Pulmonary:     Effort: Pulmonary effort is normal.   Musculoskeletal:        General: Normal range of motion.   Skin:    General: Skin is warm.   Neurological:     Mental Status: He is alert.     Neurovascular status found to be intact muscle strength adequate I did not note any profound area numbness on the plantar aspect of the right heel and he has had history of back issues over time.  Mild discomfort when pressed deeply but minimal in its intensity with good digital perfusion well-oriented x 3     Assessment:  Fasciitis like symptoms possible right versus possible nerve issues secondary to history of back issues H&P     Plan:  The x-rays reviewed and I went ahead today advised on support shoes stretching exercises and reappoint as symptoms indicate  X-rays indicated minimal spur formation no indication to stress fracture or advanced arthritis
